# Patient Record
Sex: Male | Born: 1950
Health system: Southern US, Community
[De-identification: ages and names within clinical notes are randomized; demographics above are authoritative.]

## PROBLEM LIST (undated history)

## (undated) DIAGNOSIS — N189 Chronic kidney disease, unspecified: Secondary | ICD-10-CM

## (undated) DIAGNOSIS — E785 Hyperlipidemia, unspecified: Secondary | ICD-10-CM

## (undated) DIAGNOSIS — F1910 Other psychoactive substance abuse, uncomplicated: Secondary | ICD-10-CM

## (undated) DIAGNOSIS — R011 Cardiac murmur, unspecified: Secondary | ICD-10-CM

## (undated) DIAGNOSIS — I471 Supraventricular tachycardia: Secondary | ICD-10-CM

## (undated) DIAGNOSIS — I2510 Atherosclerotic heart disease of native coronary artery without angina pectoris: Secondary | ICD-10-CM

## (undated) DIAGNOSIS — K5792 Diverticulitis of intestine, part unspecified, without perforation or abscess without bleeding: Secondary | ICD-10-CM

## (undated) DIAGNOSIS — Z720 Tobacco use: Secondary | ICD-10-CM

## (undated) DIAGNOSIS — I10 Essential (primary) hypertension: Secondary | ICD-10-CM

## (undated) DIAGNOSIS — I350 Nonrheumatic aortic (valve) stenosis: Secondary | ICD-10-CM

## (undated) HISTORY — PX: CARDIAC CATHETERIZATION: SHX172

## (undated) HISTORY — DX: Chronic kidney disease, unspecified: N18.9

## (undated) HISTORY — DX: Atherosclerotic heart disease of native coronary artery without angina pectoris: I25.10

## (undated) HISTORY — DX: Essential (primary) hypertension: I10

## (undated) HISTORY — DX: Hyperlipidemia, unspecified: E78.5

## (undated) HISTORY — DX: Tobacco use: Z72.0

## (undated) HISTORY — DX: Diverticulitis of intestine, part unspecified, without perforation or abscess without bleeding: K57.92

## (undated) HISTORY — PX: CARDIAC ELECTROPHYSIOLOGY STUDY AND ABLATION: SHX1294

## (undated) HISTORY — DX: Other psychoactive substance abuse, uncomplicated: F19.10

## (undated) HISTORY — DX: Supraventricular tachycardia: I47.1

## (undated) HISTORY — DX: Nonrheumatic aortic (valve) stenosis: I35.0

## (undated) HISTORY — DX: Cardiac murmur, unspecified: R01.1

---

## 2012-01-01 ENCOUNTER — Ambulatory Visit: Payer: Self-pay | Admitting: Family Medicine

## 2012-04-24 DIAGNOSIS — I2510 Atherosclerotic heart disease of native coronary artery without angina pectoris: Secondary | ICD-10-CM

## 2012-04-24 DIAGNOSIS — I350 Nonrheumatic aortic (valve) stenosis: Secondary | ICD-10-CM

## 2012-04-24 HISTORY — DX: Atherosclerotic heart disease of native coronary artery without angina pectoris: I25.10

## 2012-04-24 HISTORY — DX: Nonrheumatic aortic (valve) stenosis: I35.0

## 2012-04-29 ENCOUNTER — Ambulatory Visit (INDEPENDENT_AMBULATORY_CARE_PROVIDER_SITE_OTHER): Payer: PRIVATE HEALTH INSURANCE | Admitting: Internal Medicine

## 2012-04-29 ENCOUNTER — Encounter: Payer: Self-pay | Admitting: Internal Medicine

## 2012-04-29 VITALS — BP 130/68 | HR 62 | Temp 98.2°F | Resp 18 | Wt 230.5 lb

## 2012-04-29 DIAGNOSIS — R5381 Other malaise: Secondary | ICD-10-CM

## 2012-04-29 DIAGNOSIS — R5383 Other fatigue: Secondary | ICD-10-CM

## 2012-04-29 DIAGNOSIS — M1990 Unspecified osteoarthritis, unspecified site: Secondary | ICD-10-CM

## 2012-04-29 DIAGNOSIS — R011 Cardiac murmur, unspecified: Secondary | ICD-10-CM

## 2012-04-29 DIAGNOSIS — R635 Abnormal weight gain: Secondary | ICD-10-CM

## 2012-04-29 DIAGNOSIS — M2550 Pain in unspecified joint: Secondary | ICD-10-CM

## 2012-04-29 DIAGNOSIS — Z79899 Other long term (current) drug therapy: Secondary | ICD-10-CM

## 2012-04-29 DIAGNOSIS — R079 Chest pain, unspecified: Secondary | ICD-10-CM

## 2012-04-29 DIAGNOSIS — M129 Arthropathy, unspecified: Secondary | ICD-10-CM

## 2012-04-29 NOTE — Patient Instructions (Signed)
Read about the Mediterranean  Diet.

## 2012-04-29 NOTE — Progress Notes (Signed)
Patient ID: Chase Barton, male   DOB: July 05, 1951, 61 y.o.   MRN: [id number]  Patient Active Problem List  Diagnoses  . Chest pain on exertion  . Arthritis  . Systolic murmur  . Joint pain    Subjective:  CC:   Chief Complaint  Patient presents with  . New Patient    HPI:   Chase Dipasqualeis a 61 y.o. male who presents To establish primary care .  Transferring fro [facility].  Last seen 3 months ago for back pain .   He is a current smoker x 40 yrs. His cc is hest pain accompanied by  episodes of light headednessand diaphoresis  which has occurred while driving, walking and at rest.  5 or 6 in the last 6 months .   No jaw or shoulder pain. Episodes last 3 to 7 minutes, resolve with rest and/or meditation.   Occasional dsypnea.  Mother had AMI in 66's ,father in his 36's.  Last noninvasive stress test was 15 yrs ago, done because of arrhythmia.   Has gained 20 lbs in the past year .  Has a murmur on right ,  Radiates to left carotid.,  Has been having arthritis, muscle pain for the last several months, has bilateral numbness in both hands which resolves with being awake.  Aggravated by physical labor.  Wakes up feeling tired and is told that he snores.  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       Past Medical History  Diagnosis Date  . Substance abuse     remote    History reviewed. No pertinent past surgical history.       The following portions of the patient's history were reviewed and updated as appropriate: Allergies, current medications, and problem list.    Review of  Systems:   12 Pt  review of systems was negative except those addressed in the HPI,     History   Social History  . Marital Status: Married    Spouse Name: N/A    Number of Children: N/A  . Years of Education: N/A   Occupational History  . Not on file.   Social History Main Topics  . Smoking status: Current Everyday Smoker -- 3.0 packs/day    Types: Cigars  . Smokeless tobacco: Current User  . Alcohol Use: Yes     rare  . Drug Use: No  . Sexually Active: Not on file   Other Topics Concern  . Not on file   Social History Narrative  . No narrative on file    Objective:  BP 130/68  Pulse 62  Temp(Src) 98.2 F (36.8 C) (Oral)  Resp 18  Wt 230 lb 8 oz (104.554 kg)  SpO2 96%  General appearance: alert, cooperative and appears stated age Ears: normal TM's and external ear canals both ears Throat: lips, mucosa, and tongue normal; teeth and gums normal Neck: no adenopathy, no carotid bruit, supple, symmetrical, trachea midline and thyroid not enlarged, symmetric, no tenderness/mass/nodules Back: symmetric, no curvature. ROM normal. No CVA tenderness. Lungs: clear to auscultation  bilaterally Heart: regular rate and rhythm, S1, S2 normal, no murmur, click, rub or gallop Abdomen: soft, non-tender; bowel sounds normal; no masses,  no organomegaly Pulses: 2+ and symmetric Skin: Skin color, texture, turgor normal. No rashes or lesions Lymph nodes: Cervical, supraclavicular, and axillary nodes normal.  Assessment and Plan:  Chest pain on exertion Concerning for angina given his cardiac risk factors of tobaacco abuse and FH.  Refer to Cardiology for evaluation with stress testing. EKG from Feb 2012 shows sinus bradycardia, otherwise normal.  Will scan in . Lipids from 2012 were borderline,  LDL 132 , HDL 49 trigs 158.  Systolic murmur Reported by prior PCP per notes, but etiology unknown Will need ECHO to evaluate cause.  Referral to cardiology.  Arthritis Prior workup  included a CCP whch was normal symptoms  managed with celebrex. Will do autoimmune check with nexst lab draw. .  Refill given.   Fatigue accconmpaneid by snoring and weight gain.  Repeat TSH and CBC,  send for sleep study after cardiology workup    Updated Medication List Outpatient Encounter Prescriptions as of 04/29/2012  Medication Sig Dispense Refill  . celecoxib (CELEBREX) 200 MG capsule Take 200 mg by mouth daily.      . sildenafil (VIAGRA) 100 MG tablet Take 100 mg by mouth daily as needed.

## 2012-05-01 DIAGNOSIS — M2550 Pain in unspecified joint: Secondary | ICD-10-CM | POA: Insufficient documentation

## 2012-05-01 DIAGNOSIS — R079 Chest pain, unspecified: Secondary | ICD-10-CM | POA: Insufficient documentation

## 2012-05-01 DIAGNOSIS — R5383 Other fatigue: Secondary | ICD-10-CM | POA: Insufficient documentation

## 2012-05-01 DIAGNOSIS — M1990 Unspecified osteoarthritis, unspecified site: Secondary | ICD-10-CM | POA: Insufficient documentation

## 2012-05-01 DIAGNOSIS — R011 Cardiac murmur, unspecified: Secondary | ICD-10-CM | POA: Insufficient documentation

## 2012-05-01 DIAGNOSIS — G8929 Other chronic pain: Secondary | ICD-10-CM | POA: Insufficient documentation

## 2012-05-01 NOTE — Assessment & Plan Note (Signed)
accconmpaneid by snoring and weight gain.  Repeat TSH and CBC,  send for sleep study after cardiology workup

## 2012-05-01 NOTE — Assessment & Plan Note (Addendum)
Concerning for angina given his cardiac risk factors of tobaacco abuse and FH.  Refer to Cardiology for evaluation with stress testing. EKG from Feb 2012 shows sinus bradycardia, otherwise normal.  Will scan in . Lipids from 2012 were borderline,  LDL 132 , HDL 49 trigs 158.

## 2012-05-01 NOTE — Assessment & Plan Note (Addendum)
Reported by prior PCP per notes, but etiology unknown Will need ECHO to evaluate cause.  Referral to cardiology.

## 2012-05-01 NOTE — Assessment & Plan Note (Addendum)
Prior workup included a CCP whch was normal symptoms  managed with celebrex. Will do autoimmune check with nexst lab draw. .  Refill given.

## 2012-05-02 ENCOUNTER — Ambulatory Visit (INDEPENDENT_AMBULATORY_CARE_PROVIDER_SITE_OTHER): Payer: PRIVATE HEALTH INSURANCE | Admitting: Cardiovascular Disease

## 2012-05-02 ENCOUNTER — Other Ambulatory Visit: Payer: Self-pay

## 2012-05-02 ENCOUNTER — Other Ambulatory Visit (INDEPENDENT_AMBULATORY_CARE_PROVIDER_SITE_OTHER): Payer: PRIVATE HEALTH INSURANCE

## 2012-05-02 ENCOUNTER — Encounter: Payer: Self-pay | Admitting: Cardiovascular Disease

## 2012-05-02 VITALS — BP 140/82 | HR 61 | Ht 74.0 in | Wt 226.8 lb

## 2012-05-02 DIAGNOSIS — R0602 Shortness of breath: Secondary | ICD-10-CM

## 2012-05-02 DIAGNOSIS — R079 Chest pain, unspecified: Secondary | ICD-10-CM

## 2012-05-02 DIAGNOSIS — Z01818 Encounter for other preprocedural examination: Secondary | ICD-10-CM

## 2012-05-02 DIAGNOSIS — R011 Cardiac murmur, unspecified: Secondary | ICD-10-CM

## 2012-05-02 DIAGNOSIS — Z5181 Encounter for therapeutic drug level monitoring: Secondary | ICD-10-CM

## 2012-05-02 MED ORDER — NITROGLYCERIN 0.4 MG SL SUBL: 0.4000 mg | SUBLINGUAL_TABLET | SUBLINGUAL | Status: DC | PRN

## 2012-05-02 NOTE — Assessment & Plan Note (Signed)
His symptoms are worrisome for angina. Thus, I proceeded with a treadmill stress test today which was significantly abnormal with 1.5 mm ST depression in the inferior and inferolateral leads associated with significant dizziness and frequent PVCs. I advised him to start taking aspirin 81 mg once daily. I also provided him with nitroglycerin sublingual as needed. He was instructed on the proper use of this. I will schedule him for cardiac catheterization tomorrow. Risks, benefits and alternatives were discussed with the patient. An echocardiogram will be obtained before the catheterization to evaluate his aortic stenosis.

## 2012-05-02 NOTE — Assessment & Plan Note (Signed)
The patient had a cardiac murmur suggestive of aortic stenosis which does not seem to be severe by physical exam. An echocardiogram will be requested and reviewed before his cardiac catheterization.

## 2012-05-02 NOTE — Procedures (Signed)
    Treadmill Stress test  Indication: Chest pain.  Baseline Data:  Resting EKG shows NSR with rate of 79 bpm, nonspecific T wave changes. Resting blood pressure of 140/82 mm Hg Stand bruce protocal was used.  Exercise Data:  Patient exercised for 7 min 11 sec,  Peak heart rate of 129 bpm.  This was 82 % of the maximum predicted heart rate. The test was terminated prematurely due to dizziness, significant ST depression as well as frequent PVCs. Peak Blood pressure recorded was 168/58 Maximal work level: 10.1 METs.  Heart rate at 3 minutes in recovery was 87 bpm. BP response: Normal HR response: Normal  EKG with Exercise: Sinus tachycardia with frequent PVCs and 1.5 mm of downsloping ST depression in the inferior leads as well as V5 and V6.  FINAL IMPRESSION: Abnormal exercise stress test with significant downsloping ST depression at submaximal workload with associated frequent PVCs and symptoms of dyspnea and dizziness. High risk Duke treadmill score.  Recommendation: Cardiac catheterization.

## 2012-05-02 NOTE — Progress Notes (Signed)
HPI  Chase Dipasqualeis a 61 y.o. male who was referred by Dr. Darrick Huntsman for evaluation of chest pain and a cardiac murmur. He is a current smoker x 40 yrs. His cc is hest pain accompanied by episodes of light headednessand diaphoresis which has occurred while driving, walking and at rest. These episodes have worsened recently to the point of him cutting down on his physical activities. No jaw or shoulder pain. Episodes last 3 to 7 minutes, resolve with rest and/or meditation. Occasional dsypnea. Mother had AMI in 8's ,father in his 54's. No recent cardiac evaluation. He has known about a cardiac murmur for many years. 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    No Known Allergies   Current Outpatient Prescriptions on File Prior to Visit  Medication Sig Dispense Refill  . celecoxib (CELEBREX) 200 MG capsule Take 200 mg by mouth daily.      . sildenafil (VIAGRA) 100 MG tablet Take 100 mg by mouth daily as needed.      . nitroGLYCERIN (NITROSTAT) 0.4 MG SL tablet Place 1 tablet (0.4 mg total) under the tongue every 5 (five) minutes as needed for chest pain.  25 tablet  1     Past Medical History  Diagnosis Date  . Substance abuse     remote  . Heart murmur   . Hyperlipidemia      History reviewed. No pertinent past surgical history.   Family History  Problem Relation Age of Onset  . Heart disease Mother     s/p 4 vessel CABG x 2  . Heart disease Father     s/p CABG  . Cancer Father     metastatic prostate CA  . Cancer Brother 72    Lung     History   Social History  . Marital Status: Married    Spouse  Name: N/A    Number of Children: N/A  . Years of Education: N/A   Occupational History  . Not on file.   Social History Main Topics  . Smoking status: Current Everyday Smoker -- 3.0 packs/day    Types: Cigars  . Smokeless tobacco: Current User  . Alcohol Use: Yes     rare  . Drug Use: No  . Sexually Active: Not on file   Other Topics Concern  . Not on file   Social History Narrative  . No narrative on file     ROS Constitutional: Negative for fever, chills, diaphoresis, activity change, appetite change.  HENT: Negative for hearing loss, nosebleeds, congestion, sore throat, facial swelling, drooling, trouble swallowing, neck pain, voice change, sinus pressure and tinnitus.  Eyes: Negative for photophobia, pain, discharge and visual disturbance.  Respiratory: Negative for apnea, cough and wheezing.  Cardiovascular: Negative for leg swelling.  Gastrointestinal: Negative for nausea, vomiting, abdominal pain, diarrhea, constipation, blood in stool and abdominal distention.  Genitourinary: Negative for dysuria, urgency, frequency, hematuria and decreased urine volume.  Musculoskeletal: Negative for myalgias, back pain, joint swelling, arthralgias and gait problem.  Skin: Negative for color change, pallor, rash and wound.  Neurological: Negative for  tremors, seizures, syncope, speech difficulty, weakness,  light-headedness, numbness and headaches.  Psychiatric/Behavioral: Negative for suicidal ideas, hallucinations, behavioral problems and agitation. The patient is not nervous/anxious.     PHYSICAL EXAM   BP 140/82  Pulse 61  Ht 6\' 2"  (1.88 m)  Wt 226 lb 12.8 oz (102.876 kg)  BMI 29.12 kg/m2 Constitutional: He is oriented to person, place, and time. He appears well-developed and well-nourished. No distress.  HENT: No nasal discharge.  Head: Normocephalic and atraumatic.  Eyes: Pupils are equal and round. Right eye exhibits no discharge. Left eye exhibits no discharge.    Neck: Normal range of motion. Neck supple. No JVD present. No thyromegaly present.  Cardiovascular: Normal rate, regular rhythm, normal heart sounds and. Exam reveals no gallop and no friction rub. There is a 2/6 systolic ejection murmur at the aortic area which is early peaking with preserved S2.  Pulmonary/Chest: Effort normal and breath sounds normal. No stridor. No respiratory distress. He has no wheezes. He has no rales. He exhibits no tenderness.  Abdominal: Soft. Bowel sounds are normal. He exhibits no distension. There is no tenderness. There is no rebound and no guarding.  Musculoskeletal: Normal range of motion. He exhibits no edema and no tenderness.  Neurological: He is alert and oriented to person, place, and time. Coordination normal.  Skin: Skin is warm and dry. No rash noted. He is not diaphoretic. No erythema. No pallor.  Psychiatric: He has a normal mood and affect. His behavior is normal. Judgment and thought content normal.       EKG: Normal sinus rhythm with nonspecific T wave changes.   ASSESSMENT AND PLAN

## 2012-05-02 NOTE — Patient Instructions (Signed)
Start on Nitro SL 0.4  [id number]

## 2012-05-02 NOTE — Patient Instructions (Addendum)
Your physician has requested that you have an echocardiogram. Echocardiography is a painless test that uses sound waves to create images of your heart. It provides your doctor with information about the size and shape of your heart and how well your heart's chambers and valves are working. This procedure takes approximately one hour. There are no restrictions for this procedure.  Your physician has requested that you have an exercise tolerance test. For further information please visit [email or web address] Please also follow instruction sheet, as given.  Please start on SL NTG 0.4 mg place under tongue. Start aspirin 81 mg take one tablet daily.

## 2012-05-03 ENCOUNTER — Ambulatory Visit: Payer: Self-pay | Admitting: Cardiovascular Disease

## 2012-05-03 DIAGNOSIS — I2510 Atherosclerotic heart disease of native coronary artery without angina pectoris: Secondary | ICD-10-CM

## 2012-05-03 LAB — CBC WITH DIFFERENTIAL/PLATELET
Basos: 0 % (ref 0–3)
Eos: 2 % (ref 0–7)
HCT: 49.6 % (ref 37.5–51.0)
Hemoglobin: 17.3 g/dL (ref 12.6–17.7)
Immature Granulocytes: 0 % (ref 0–2)
Lymphocytes Absolute: 1.9 10*3/uL (ref 0.7–4.5)
MCV: 88 fL (ref 79–97)
Monocytes Absolute: 0.5 10*3/uL (ref 0.1–1.0)
Monocytes: 6 % (ref 4–13)
Neutrophils Absolute: 5.7 10*3/uL (ref 1.8–7.8)
RBC: 5.65 x10E6/uL (ref 4.14–5.80)
RDW: 13.3 % (ref 12.3–15.4)
WBC: 8.2 10*3/uL (ref 4.0–10.5)

## 2012-05-03 LAB — BASIC METABOLIC PANEL
BUN/Creatinine Ratio: 20 (ref 10–22)
BUN: 22 mg/dL (ref 8–27)
CO2: 17 mmol/L — ABNORMAL LOW (ref 20–32)
Calcium: 9.8 mg/dL (ref 8.6–10.2)
Creatinine, Ser: 1.09 mg/dL (ref 0.76–1.27)
GFR calc non Af Amer: 72 mL/min/{1.73_m2} (ref >59)

## 2012-05-03 LAB — PROTIME-INR: Prothrombin Time: 11.2 s (ref 9.1–12.0)

## 2012-05-04 DIAGNOSIS — I200 Unstable angina: Secondary | ICD-10-CM

## 2012-05-04 DIAGNOSIS — R9430 Abnormal result of cardiovascular function study, unspecified: Secondary | ICD-10-CM

## 2012-05-04 LAB — BASIC METABOLIC PANEL
Anion Gap: 5 — ABNORMAL LOW (ref 7–16)
Calcium, Total: 8.2 mg/dL — ABNORMAL LOW (ref 8.5–10.1)
Chloride: 110 mmol/L — ABNORMAL HIGH (ref 98–107)
Co2: 29 mmol/L (ref 21–32)
Creatinine: 1.01 mg/dL (ref 0.60–1.30)
EGFR (Non-African Amer.): >60
Osmolality: 290 (ref 275–301)
Potassium: 4 mmol/L (ref 3.5–5.1)
Sodium: 144 mmol/L (ref 136–145)

## 2012-05-04 LAB — CK TOTAL AND CKMB (NOT AT ARMC)
CK, Total: 60 U/L (ref 35–232)
CK-MB: 1.2 ng/mL (ref 0.5–3.6)

## 2012-05-06 ENCOUNTER — Encounter: Payer: Self-pay | Admitting: Cardiovascular Disease

## 2012-05-06 ENCOUNTER — Ambulatory Visit (INDEPENDENT_AMBULATORY_CARE_PROVIDER_SITE_OTHER): Payer: PRIVATE HEALTH INSURANCE | Admitting: Cardiovascular Disease

## 2012-05-06 VITALS — BP 132/80 | HR 65 | Ht 74.0 in | Wt 226.0 lb

## 2012-05-06 DIAGNOSIS — I350 Nonrheumatic aortic (valve) stenosis: Secondary | ICD-10-CM

## 2012-05-06 DIAGNOSIS — Z720 Tobacco use: Secondary | ICD-10-CM

## 2012-05-06 DIAGNOSIS — I359 Nonrheumatic aortic valve disorder, unspecified: Secondary | ICD-10-CM

## 2012-05-06 DIAGNOSIS — E785 Hyperlipidemia, unspecified: Secondary | ICD-10-CM

## 2012-05-06 DIAGNOSIS — E782 Mixed hyperlipidemia: Secondary | ICD-10-CM | POA: Insufficient documentation

## 2012-05-06 DIAGNOSIS — F17200 Nicotine dependence, unspecified, uncomplicated: Secondary | ICD-10-CM

## 2012-05-06 DIAGNOSIS — R0989 Other specified symptoms and signs involving the circulatory and respiratory systems: Secondary | ICD-10-CM

## 2012-05-06 DIAGNOSIS — I2510 Atherosclerotic heart disease of native coronary artery without angina pectoris: Secondary | ICD-10-CM

## 2012-05-06 NOTE — Patient Instructions (Signed)
Your physician has requested that you have a carotid duplex. This test is an ultrasound of the carotid arteries in your neck. It looks at blood flow through these arteries that supply the brain with blood. Allow one hour for this exam. There are no restrictions or special instructions.  Start cardiac rehab.   Get your blood work done in 2 weeks (already ordered by Dr. Darrick Huntsman).   Follow up in 2 months.

## 2012-05-06 NOTE — Assessment & Plan Note (Signed)
Due to his CAD, he was started on atorvastatin 40 mg once daily. I recommend a target LDL of less than 70. He already has labs scheduled to be done later this month which were requested by Dr. Darrick Huntsman. These should be postponed for one week to see the effect of atorvastatin. They can be done in 2 weeks from now.

## 2012-05-06 NOTE — Assessment & Plan Note (Signed)
This is mild to moderate at this point and will need to be monitored. I recommend an echocardiogram in May of 2014.

## 2012-05-06 NOTE — Assessment & Plan Note (Signed)
The patient has bilateral carotid bruits versus a radiated murmur from aortic stenosis. Due to his known history of atherosclerosis as well as risk factors, I recommend carotid duplex ultrasound.

## 2012-05-06 NOTE — Assessment & Plan Note (Signed)
I discussed with him the importance of smoking cessation and making this a priority at this point.

## 2012-05-06 NOTE — Assessment & Plan Note (Signed)
The patient seems to be doing reasonably well after her recent angioplasty and drug-eluting stent placement to the proximal LAD. His dyspnea has improved significantly. He had few minor episodes of chest discomfort but has not had to use nitroglycerin. I recommend continuing dual antiplatelet therapy for at least one year. I discussed with the patient the importance of lifestyle changes in order to decrease the chance of future cardiovascular events. We discussed the importance of controlling risk factors, healthy diet as well as regular exercise. I advised him to start cardiac rehabilitation.

## 2012-05-06 NOTE — Progress Notes (Signed)
HPI  This is a 61 year old male who is here today for a followup visit. I saw him last week for exertional chest pain and a cardiac murmur. Echocardiogram showed normal LV systolic function with mild to moderate aortic stenosis. He underwent a treadmill stress test on the same day which was highly abnormal with significant ST depression and frequent PVCs. He underwent cardiac catheterization next day on Friday via the right radial artery which showed severe two-vessel coronary artery disease. His RCA was found to be occluded with left to right collaterals and appears to be chronic. There was a 90% proximal LAD stenosis and mild disease in the left circumflex. Ejection fraction was normal with moderate aortic stenosis with a peak to peak gradient of 23 mm mercury. He underwent a successful angioplasty and drug-eluting stent placement to the left anterior descending artery without complications. He was discharged home on Saturday. I brought him for an early followup visit given that he will be going out of town this week. Overall, he feels better but has not done much physical activities. He had few episodes of chest tightness which were brief. The patient definitely has been under some stress related to the findings on cardiac catheterization.  No Known Allergies   Current Outpatient Prescriptions on File Prior to Visit  Medication Sig Dispense Refill  . aspirin EC 81 MG tablet Take 1 tablet (81 mg total) by mouth daily.      Marland Kitchen atorvastatin (LIPITOR) 40 MG tablet Take 40 mg by mouth daily.      . nitroGLYCERIN (NITROSTAT) 0.4 MG SL tablet Place 1 tablet (0.4 mg total) under the tongue every 5 (five) minutes as needed for chest pain.  25 tablet  1  . sildenafil (VIAGRA) 100 MG tablet Take 100 mg by mouth daily as needed.         Past Medical History  Diagnosis Date  . Substance abuse     remote  . Heart murmur   . Aortic stenosis 04/2012    mild-moderate  . Coronary artery disease 04/2012      Cardiac cath 04/2012: LAD, 90% proximal, RCA: 100% mid with left to right collaterals, Normal EF, Moderate aortic stenosis (peak -peak gradient of 23 mm Hg), LAD PCI with a DES placment.   . Hyperlipidemia      Past Surgical History  Procedure Date  . Cardiac catheterization      Family History  Problem Relation Age of Onset  . Heart disease Mother     s/p 4 vessel CABG x 2  . Heart disease Father     s/p CABG  . Cancer Father     metastatic prostate CA  . Cancer Brother 83    Lung     History   Social History  . Marital Status: Married    Spouse Name: N/A    Number of Children: N/A  . Years of Education: N/A   Occupational History  . Not on file.   Social History Main Topics  . Smoking status: Current Everyday Smoker -- 3.0 packs/day    Types: Cigars  . Smokeless tobacco: Current User  . Alcohol Use: Yes     rare  . Drug Use: No  . Sexually Active: Not on file   Other Topics Concern  . Not on file   Social History Narrative  . No narrative on file     PHYSICAL EXAM   BP 132/80  Pulse 65  Ht 6\' 2"  (1.88  m)  Wt 226 lb (102.513 kg)  BMI 29.02 kg/m2 Constitutional: He is oriented to person, place, and time. He appears well-developed and well-nourished. No distress.  HENT: No nasal discharge.  Head: Normocephalic and atraumatic.  Eyes: Pupils are equal and round. Right eye exhibits no discharge. Left eye exhibits no discharge.  Neck: Normal range of motion. Neck supple. No JVD present. No thyromegaly present. Bilateral carotid bruits are here versus radiated murmur. Cardiovascular: Normal rate, regular rhythm, normal heart sounds and. Exam reveals no gallop and no friction rub. There is a 2/6 systolic ejection murmur at the aortic area which is early peaking with preserved S2.  Pulmonary/Chest: Effort normal and breath sounds normal. No stridor. No respiratory distress. He has no wheezes. He has no rales. He exhibits no tenderness.  Abdominal: Soft.  Bowel sounds are normal. He exhibits no distension. There is no tenderness. There is no rebound and no guarding.  Musculoskeletal: Normal range of motion. He exhibits no edema and no tenderness.  Neurological: He is alert and oriented to person, place, and time. Coordination normal.  Skin: Skin is warm and dry. No rash noted. He is not diaphoretic. No erythema. No pallor.  Psychiatric: He has a normal mood and affect. His behavior is normal. Judgment and thought content normal.  Right radial pulse is normal with no hematoma.     ASSESSMENT AND PLAN

## 2012-05-14 ENCOUNTER — Encounter: Payer: Self-pay | Admitting: Cardiovascular Disease

## 2012-05-16 ENCOUNTER — Encounter (INDEPENDENT_AMBULATORY_CARE_PROVIDER_SITE_OTHER): Payer: PRIVATE HEALTH INSURANCE

## 2012-05-16 DIAGNOSIS — R0989 Other specified symptoms and signs involving the circulatory and respiratory systems: Secondary | ICD-10-CM

## 2012-05-21 ENCOUNTER — Other Ambulatory Visit (INDEPENDENT_AMBULATORY_CARE_PROVIDER_SITE_OTHER): Payer: PRIVATE HEALTH INSURANCE | Admitting: *Deleted

## 2012-05-21 DIAGNOSIS — M1990 Unspecified osteoarthritis, unspecified site: Secondary | ICD-10-CM

## 2012-05-21 DIAGNOSIS — R635 Abnormal weight gain: Secondary | ICD-10-CM

## 2012-05-21 DIAGNOSIS — M129 Arthropathy, unspecified: Secondary | ICD-10-CM

## 2012-05-21 DIAGNOSIS — R079 Chest pain, unspecified: Secondary | ICD-10-CM

## 2012-05-21 DIAGNOSIS — R5381 Other malaise: Secondary | ICD-10-CM

## 2012-05-21 DIAGNOSIS — R5383 Other fatigue: Secondary | ICD-10-CM

## 2012-05-21 LAB — LIPID PANEL
HDL: 42.6 mg/dL (ref >39.00)
LDL Cholesterol: 64 mg/dL (ref 0–99)
Total CHOL/HDL Ratio: 3
Triglycerides: 119 mg/dL (ref 0.0–149.0)
VLDL: 23.8 mg/dL (ref 0.0–40.0)

## 2012-05-21 LAB — CBC WITH DIFFERENTIAL/PLATELET
Basophils Absolute: 0 10*3/uL (ref 0.0–0.1)
Eosinophils Absolute: 0.2 10*3/uL (ref 0.0–0.7)
Lymphocytes Relative: 14.1 % (ref 12.0–46.0)
MCHC: 33.8 g/dL (ref 30.0–36.0)
Neutro Abs: 5.3 10*3/uL (ref 1.4–7.7)
Neutrophils Relative %: 77.3 % — ABNORMAL HIGH (ref 43.0–77.0)
Platelets: 157 10*3/uL (ref 150.0–400.0)
RDW: 13.2 % (ref 11.5–14.6)

## 2012-05-21 LAB — SEDIMENTATION RATE: Sed Rate: 14 mm/hr (ref 0–22)

## 2012-05-22 ENCOUNTER — Other Ambulatory Visit: Payer: PRIVATE HEALTH INSURANCE

## 2012-05-22 LAB — RHEUMATOID FACTOR: Rhuematoid fact SerPl-aCnc: <10 IU/mL (ref <=14)

## 2012-05-22 LAB — COMPLETE METABOLIC PANEL WITH GFR
ALT: 21 U/L (ref 0–53)
AST: 20 U/L (ref 0–37)
Alkaline Phosphatase: 65 U/L (ref 39–117)
CO2: 26 mEq/L (ref 19–32)
Creat: 1.07 mg/dL (ref 0.50–1.35)
GFR, Est African American: 86 mL/min
Total Bilirubin: 0.6 mg/dL (ref 0.3–1.2)

## 2012-05-27 ENCOUNTER — Telehealth: Payer: Self-pay | Admitting: Cardiovascular Disease

## 2012-05-27 NOTE — Telephone Encounter (Signed)
Pt calling stating that he is experiencing weakness in muscle, tired, groggy and some CPfrom Blood thinner and cholesterol.

## 2012-05-27 NOTE — Telephone Encounter (Signed)
Pt reports worsening muscle aches, weakness since starting atorvastatin 3 weeks ago.  He has also noticed some chest pain in "upper part of chest" after he takes the medication.  He says this type of pain is completely different from the pain he was having pre stent, says the pain he was having pre stent was much different and worse.  He confirms compliance with Plavix.  I advised him to hold the atorvastatin for a few days and see if symptoms improve.  I will call him/he will call me Friday to report. If symptoms improve with holding med, we may need to change him to another lipid lowering agent.   In the meantime, he should contact us ASAP should his symptoms go unchanged/get worse.  Understanding verb.

## 2012-05-27 NOTE — Telephone Encounter (Signed)
Arida pt, but please see below what I did. thanks

## 2012-05-28 NOTE — Telephone Encounter (Signed)
Sounds good

## 2012-06-19 ENCOUNTER — Telehealth: Payer: Self-pay | Admitting: Cardiovascular Disease

## 2012-06-19 ENCOUNTER — Ambulatory Visit: Payer: PRIVATE HEALTH INSURANCE | Admitting: Cardiovascular Disease

## 2012-06-19 NOTE — Telephone Encounter (Signed)
Pt is s/p stent to LAD in May 2013.   He has been exercising (run/walk x2 miles daily and weight lifting).  He says he was exercising yesterday and became very dizzy, diaphoretic (more than usual), began having severe chest pressure.  He then decided to sit down and try to rest.  Chest pressure resumed with rest.  He took a NTG SL x1 and got relief.  He has yet to have to use the NTG until yesterday.  He also states he is aware this may be musculoskeletal discomfort, but states, "I know what sore muscles feel like and this was different.  This was internal".  He confirms compliance with Plavix and ASA. He was unable to tolerate the atorvastatin and is holding this (Dr. Kirke Corin aware).  Prior to stent to LAD, he had dyspnea and chest pressure.  He says the pressure he had yesterday was similar to pre stent discomfort.  He also says he had to "take deep breaths" during episode yesterday.  This am he is asymptomatic.  Based on his symptoms and recent history, I advised him to come in for OV to be assessed.  Understanding verb. Coming in today to see Dr. Mariah Milling in Dr. Jari Sportsman absence at 2 pm.

## 2012-06-19 NOTE — Telephone Encounter (Signed)
Pt calling stating that he had to take a nitro last night around 8 pm due to chest tightness, sweating, pressure and pain, slight lightheadness. No vomiting. Pain went away after taking pill. Pain did not return.

## 2012-06-19 NOTE — Telephone Encounter (Signed)
Discussed situation with Dr. Mariah Milling. He says he would be glad to see pt today if pt needs to.  Otherwise pt may want to wait until he can see Dr. Kirke Corin tomm in office. If stent restenosis/etc Dr. Kirke Corin would need to address.  I called pt to give him the option of either come in today at 3:30 pm with Dr. Mariah Milling or await appt tomm to see Dr. Kirke Corin.  He chose to wait until tomm to see Dr. Kirke Corin at 11:15.  He will take NTG SL PRN CP in the interim.

## 2012-06-20 ENCOUNTER — Encounter: Payer: Self-pay | Admitting: Cardiovascular Disease

## 2012-06-20 ENCOUNTER — Ambulatory Visit (INDEPENDENT_AMBULATORY_CARE_PROVIDER_SITE_OTHER): Payer: PRIVATE HEALTH INSURANCE | Admitting: Cardiovascular Disease

## 2012-06-20 VITALS — BP 160/70 | HR 60 | Ht 74.0 in | Wt 228.0 lb

## 2012-06-20 DIAGNOSIS — I2510 Atherosclerotic heart disease of native coronary artery without angina pectoris: Secondary | ICD-10-CM

## 2012-06-20 DIAGNOSIS — I10 Essential (primary) hypertension: Secondary | ICD-10-CM

## 2012-06-20 DIAGNOSIS — F17200 Nicotine dependence, unspecified, uncomplicated: Secondary | ICD-10-CM

## 2012-06-20 DIAGNOSIS — E785 Hyperlipidemia, unspecified: Secondary | ICD-10-CM

## 2012-06-20 DIAGNOSIS — Z720 Tobacco use: Secondary | ICD-10-CM

## 2012-06-20 DIAGNOSIS — R079 Chest pain, unspecified: Secondary | ICD-10-CM

## 2012-06-20 HISTORY — DX: Essential (primary) hypertension: I10

## 2012-06-20 MED ORDER — ISOSORBIDE MONONITRATE ER 30 MG PO TB24: 30.0000 mg | ORAL_TABLET | Freq: Every day | ORAL | Status: DC

## 2012-06-20 MED ORDER — AMLODIPINE BESYLATE 10 MG PO TABS: 10.0000 mg | ORAL_TABLET | Freq: Every day | ORAL | Status: DC

## 2012-06-20 NOTE — Patient Instructions (Addendum)
You are doing well. Please start taking crestor 5 mg every other day We should check your cholesterol in 3 months  Try amlodipine 1/2 once a day Monitor blood pressure If it continue to run high, take the full pilll  If you start to have more chest pain, call the office Also you could start the isosorbide one a day for pain  Increase aspirin to 81 mg x 2 Continue plavix  Stop cigar smoking  We will check other markers on next cholesterol check  Please call us if you have new issues that need to be addressed before your next appt.  Your physician wants you to follow-up in: 1 months.

## 2012-06-20 NOTE — Assessment & Plan Note (Signed)
If he continues to have episodes of chest pain, we have suggested he take nitroglycerin. We did prescribe isosorbide if he has additional episodes and have instructed him to call our office.

## 2012-06-20 NOTE — Assessment & Plan Note (Signed)
Blood pressure is very elevated today. We will start amlodipine 5 mg daily, titrating up to 10 mg daily if needed for systolic pressure greater than 140

## 2012-06-20 NOTE — Assessment & Plan Note (Signed)
We have asked him to cut back on his cigar smoking

## 2012-06-20 NOTE — Progress Notes (Signed)
Patient ID: Chase Barton, male    DOB: 26-Oct-1951, 61 y.o.   MRN: [id number]  HPI Comments: This is a 61 year old male with history of coronary artery disease, occluded RCA with collaterals from left to right, recent stent placement to his LAD, history of smoking cigars, strong family history of coronary artery disease, hypertension, who presents for chest pain symptoms.  Recent Echocardiogram showed normal LV systolic function with mild to moderate aortic stenosis.  He underwent a treadmill stress test on the same day which was highly abnormal with significant ST depression and frequent PVCs.  He underwent cardiac catheterization next day on Friday via the right radial artery which showed severe two-vessel coronary artery disease. His RCA was found to be occluded with left to right collaterals and appears to be chronic. There was a 90% proximal LAD stenosis and mild disease in the left circumflex. Ejection fraction was normal with moderate aortic stenosis with a peak to peak gradient of 23 mm mercury.   He underwent a successful angioplasty and drug-eluting stent placement to the left anterior descending artery without complications.  He is exercising since the stent on a regular basis but did have chest pain 2 days ago. He took nitroglycerin with complete resolution of his symptoms. He is concerned about recent chest pain and whether he should continue exercise.  EKG shows normal sinus rhythm with no significant ST or T wave changes. No change from prior EKG     Outpatient Encounter Prescriptions as of 06/20/2012  Medication Sig Dispense Refill  . aspirin EC 81 MG tablet Take 1 tablet (81 mg total) by mouth daily.      . clopidogrel (PLAVIX) 75 MG tablet Take 75 mg by mouth daily.      . Multiple Vitamin (MULTIVITAMIN) tablet Take 1 tablet by mouth daily.      . nitroGLYCERIN (NITROSTAT) 0.4 MG SL tablet Place 1 tablet (0.4 mg total) under the tongue every 5 (five) minutes as needed for  chest pain.  25 tablet  1  . sildenafil (VIAGRA) 100 MG tablet Take 100 mg by mouth daily as needed.      . Vitamin E LIQD by Does not apply route.        Review of Systems  Constitutional: Negative.   HENT: Negative.   Eyes: Negative.   Respiratory: Negative.   Cardiovascular: Positive for chest pain.  Gastrointestinal: Negative.   Musculoskeletal: Negative.   Skin: Negative.   Neurological: Negative.   Hematological: Negative.   Psychiatric/Behavioral: Negative.   All other systems reviewed and are negative.    BP 160/70  Pulse 60  Ht 6\' 2"  (1.88 m)  Wt 228 lb (103.42 kg)  BMI 29.27 kg/m2  Physical Exam  Nursing note and vitals reviewed. Constitutional: He is oriented to person, place, and time. He appears well-developed and well-nourished.  HENT:  Head: Normocephalic.  Nose: Nose normal.  Mouth/Throat: Oropharynx is clear and moist.  Eyes: Conjunctivae are normal. Pupils are equal, round, and reactive to light.  Neck: Normal range of motion. Neck supple. No JVD present.  Cardiovascular: Normal rate, regular rhythm, S1 normal, S2 normal, normal heart sounds and intact distal pulses.  Exam reveals no gallop and no friction rub.   No murmur heard. Pulmonary/Chest: Effort normal and breath sounds normal. No respiratory distress. He has no wheezes. He has no rales. He exhibits no tenderness.  Abdominal: Soft. Bowel sounds are normal. He exhibits no distension. There is no tenderness.  Musculoskeletal: Normal range  of motion. He exhibits no edema and no tenderness.  Lymphadenopathy:    He has no cervical adenopathy.  Neurological: He is alert and oriented to person, place, and time. Coordination normal.  Skin: Skin is warm and dry. No rash noted. No erythema.  Psychiatric: He has a normal mood and affect. His behavior is normal. Judgment and thought content normal.           Assessment and Plan

## 2012-06-20 NOTE — Assessment & Plan Note (Signed)
He is currently not on a statin and we will start Crestor 5 mg every other day, titrating up to daily as tolerated.

## 2012-06-20 NOTE — Assessment & Plan Note (Signed)
No significant EKG changes. No further symptoms apart from 2 days ago. We have asked him to resume his activity and contact our office if he has any further symptoms.

## 2012-07-09 ENCOUNTER — Encounter: Payer: Self-pay | Admitting: Cardiovascular Disease

## 2012-07-09 ENCOUNTER — Ambulatory Visit (INDEPENDENT_AMBULATORY_CARE_PROVIDER_SITE_OTHER): Payer: BC Managed Care – PPO | Admitting: Cardiovascular Disease

## 2012-07-09 VITALS — BP 128/70 | HR 60 | Ht 74.0 in | Wt 230.5 lb

## 2012-07-09 DIAGNOSIS — R0989 Other specified symptoms and signs involving the circulatory and respiratory systems: Secondary | ICD-10-CM

## 2012-07-09 DIAGNOSIS — I350 Nonrheumatic aortic (valve) stenosis: Secondary | ICD-10-CM

## 2012-07-09 DIAGNOSIS — I2510 Atherosclerotic heart disease of native coronary artery without angina pectoris: Secondary | ICD-10-CM

## 2012-07-09 DIAGNOSIS — I359 Nonrheumatic aortic valve disorder, unspecified: Secondary | ICD-10-CM

## 2012-07-09 DIAGNOSIS — Z720 Tobacco use: Secondary | ICD-10-CM

## 2012-07-09 DIAGNOSIS — F17200 Nicotine dependence, unspecified, uncomplicated: Secondary | ICD-10-CM

## 2012-07-09 DIAGNOSIS — E785 Hyperlipidemia, unspecified: Secondary | ICD-10-CM

## 2012-07-09 MED ORDER — ROSUVASTATIN CALCIUM 5 MG PO TABS: 5.0000 mg | ORAL_TABLET | Freq: Every day | ORAL | Status: DC

## 2012-07-09 NOTE — Assessment & Plan Note (Signed)
He is trying to quit with electronic cigarettes.

## 2012-07-09 NOTE — Assessment & Plan Note (Signed)
He is currently doing well. No further chest pain. He can continue exercise with a peak target heart rate of 140 beats per minute. Continue amlodipine 5 mg daily. The patient does use Viagra as needed and thus I instructed him not to start long acting nitroglycerin. Continue dual antiplatelet therapy.

## 2012-07-09 NOTE — Patient Instructions (Addendum)
Do not start taking Isosorbide mononitrate.  Take Crestor 5 mg daily.  Labs in 4 weeks (fasting).  You can get your heart rate up to 140 bpm.  Follow up in 6 months.

## 2012-07-09 NOTE — Assessment & Plan Note (Signed)
He is tolerating Crestor every other day. I've asked him to start taking it every day. I will request fasting lipid and liver profile in 4 weeks.

## 2012-07-09 NOTE — Assessment & Plan Note (Signed)
No significant carotid disease by carotid duplex ultrasound.

## 2012-07-09 NOTE — Progress Notes (Signed)
HPI  This is a 61 year old male with history of moderate aortic stenosis, coronary artery disease, occluded RCA with collaterals from left to right,  stent placement to his LAD in May, history of smoking cigars, strong family history of coronary artery disease, hypertension, who presents for a followup visit.  Recent Echocardiogram showed normal LV systolic function with mild to moderate aortic stenosis.  He underwent a treadmill stress test on the same day which was highly abnormal with significant ST depression and frequent PVCs.  He underwent cardiac catheterization in May via the right radial artery which showed severe two-vessel coronary artery disease. His RCA was found to be occluded with left to right collaterals and appears to be chronic. There was a 90% proximal LAD stenosis and mild disease in the left circumflex. Ejection fraction was normal with moderate aortic stenosis with a peak to peak gradient of 23 mm mercury.  He underwent a successful angioplasty and drug-eluting stent placement to the left anterior descending artery without complications.  He was seen by Dr. Mariah Milling recently for chest pain. The patient was exercising extensively at that time and was getting his heart rate up to 160 beats per minute. He was noted to be hypertensive. He was started on amlodipine. He did not tolerate atorvastatin and thus he was started on Crestor 5 mg every other day. The patient cut down on his exercise and now is targeting the peak heart rate of 120 beats per minute. He had no further episodes of chest pain since then.   No Known Allergies   Current Outpatient Prescriptions on File Prior to Visit  Medication Sig Dispense Refill  . aspirin EC 81 MG tablet Take 1 tablet (81 mg total) by mouth daily.      . clopidogrel (PLAVIX) 75 MG tablet Take 75 mg by mouth daily.      . Multiple Vitamin (MULTIVITAMIN) tablet Take 1 tablet by mouth daily.      . nitroGLYCERIN (NITROSTAT) 0.4 MG SL tablet  Place 1 tablet (0.4 mg total) under the tongue every 5 (five) minutes as needed for chest pain.  25 tablet  1  . rosuvastatin (CRESTOR) 5 MG tablet Take 1 tablet (5 mg total) by mouth daily.  30 tablet  11  . sildenafil (VIAGRA) 100 MG tablet Take 100 mg by mouth daily as needed.      . Vitamin E LIQD by Does not apply route.      Marland Kitchen DISCONTD: amLODipine (NORVASC) 10 MG tablet Take 1 tablet (10 mg total) by mouth daily.  180 tablet  3     Past Medical History  Diagnosis Date  . Substance abuse     remote  . Heart murmur   . Aortic stenosis 04/2012    mild-moderate  . Coronary artery disease 04/2012    Cardiac cath 04/2012: LAD, 90% proximal, RCA: 100% mid with left to right collaterals, Normal EF, Moderate aortic stenosis (peak -peak gradient of 23 mm Hg), LAD PCI with a DES placment.   . Hyperlipidemia   . Tobacco abuse   . Hypertension 06/20/2012     Past Surgical History  Procedure Date  . Cardiac catheterization      Family History  Problem Relation Age of Onset  . Heart disease Mother     s/p 4 vessel CABG x 2  . Heart disease Father     s/p CABG  . Cancer Father     metastatic prostate CA  . Cancer Brother  52    Lung     History   Social History  . Marital Status: Married    Spouse Name: N/A    Number of Children: N/A  . Years of Education: N/A   Occupational History  . Not on file.   Social History Main Topics  . Smoking status: Former Smoker -- 3.0 packs/day  . Smokeless tobacco: Current User   Comment: uses [employer]  . Alcohol Use: 0.6 oz/week    1 Glasses of wine per week     rare  . Drug Use: No  . Sexually Active: Not on file   Other Topics Concern  . Not on file   Social History Narrative  . No narrative on file     PHYSICAL EXAM   BP 128/70  Pulse 60  Ht 6\' 2"  (1.88 m)  Wt 230 lb 8 oz (104.554 kg)  BMI 29.59 kg/m2  Constitutional: He is oriented to person, place, and time. He appears well-developed and well-nourished. No  distress.  HENT: No nasal discharge.  Head: Normocephalic and atraumatic.  Eyes: Pupils are equal and round. Right eye exhibits no discharge. Left eye exhibits no discharge.  Neck: Normal range of motion. Neck supple. No JVD present. No thyromegaly present.  Cardiovascular: Normal rate, regular rhythm, normal heart sounds and. Exam reveals no gallop and no friction rub. There is a 2/6 systolic ejection murmur at the aortic area which is mid peaking.  Pulmonary/Chest: Effort normal and breath sounds normal. No stridor. No respiratory distress. He has no wheezes. He has no rales. He exhibits no tenderness.  Abdominal: Soft. Bowel sounds are normal. He exhibits no distension. There is no tenderness. There is no rebound and no guarding.  Musculoskeletal: Normal range of motion. He exhibits no edema and no tenderness.  Neurological: He is alert and oriented to person, place, and time. Coordination normal.  Skin: Skin is warm and dry. No rash noted. He is not diaphoretic. No erythema. No pallor.  Psychiatric: He has a normal mood and affect. His behavior is normal. Judgment and thought content normal.        ASSESSMENT AND PLAN

## 2012-07-09 NOTE — Assessment & Plan Note (Signed)
This was moderate. A followup echocardiogram is recommended in one year from now.

## 2012-08-06 ENCOUNTER — Ambulatory Visit (INDEPENDENT_AMBULATORY_CARE_PROVIDER_SITE_OTHER): Payer: BC Managed Care – PPO | Admitting: Internal Medicine

## 2012-08-06 ENCOUNTER — Telehealth: Payer: Self-pay | Admitting: Internal Medicine

## 2012-08-06 ENCOUNTER — Encounter: Payer: Self-pay | Admitting: Internal Medicine

## 2012-08-06 VITALS — BP 140/70 | HR 70 | Temp 97.7°F | Resp 18 | Wt 230.8 lb

## 2012-08-06 DIAGNOSIS — I739 Peripheral vascular disease, unspecified: Secondary | ICD-10-CM

## 2012-08-06 DIAGNOSIS — I359 Nonrheumatic aortic valve disorder, unspecified: Secondary | ICD-10-CM

## 2012-08-06 DIAGNOSIS — R5383 Other fatigue: Secondary | ICD-10-CM

## 2012-08-06 DIAGNOSIS — R011 Cardiac murmur, unspecified: Secondary | ICD-10-CM

## 2012-08-06 DIAGNOSIS — R5381 Other malaise: Secondary | ICD-10-CM

## 2012-08-06 DIAGNOSIS — J449 Chronic obstructive pulmonary disease, unspecified: Secondary | ICD-10-CM

## 2012-08-06 DIAGNOSIS — I350 Nonrheumatic aortic (valve) stenosis: Secondary | ICD-10-CM

## 2012-08-06 DIAGNOSIS — Z720 Tobacco use: Secondary | ICD-10-CM

## 2012-08-06 DIAGNOSIS — E785 Hyperlipidemia, unspecified: Secondary | ICD-10-CM

## 2012-08-06 DIAGNOSIS — F17200 Nicotine dependence, unspecified, uncomplicated: Secondary | ICD-10-CM

## 2012-08-06 MED ORDER — NICOTINE 14 MG/24HR TD PT24: 1.0000 | MEDICATED_PATCH | TRANSDERMAL | Status: AC

## 2012-08-06 MED ORDER — ALBUTEROL SULFATE HFA 108 (90 BASE) MCG/ACT IN AERS: 2.0000 | INHALATION_SPRAY | Freq: Four times a day (QID) | RESPIRATORY_TRACT | Status: DC | PRN

## 2012-08-06 MED ORDER — AMOXICILLIN-POT CLAVULANATE 875-125 MG PO TABS: 1.0000 | ORAL_TABLET | Freq: Two times a day (BID) | ORAL | Status: AC

## 2012-08-06 NOTE — Assessment & Plan Note (Addendum)
I was very blunt with him about the limitations of modern medicine in preventing recurrent stenoses of his arteries if he continues to smoke. I recommended a trial of transdermal nicotine and e cigs.

## 2012-08-06 NOTE — Assessment & Plan Note (Signed)
I am sending [facility] to [facility] for an arterial evaluation. Although he has a palpable dorsalis pedis pulse on the left his calf pain and left thigh pain is concerning for external or internal iliac artery stenoses with collateralization

## 2012-08-06 NOTE — Assessment & Plan Note (Addendum)
He is attributing his malaise to "all the medications" that he is on. He is not on a beta blocker or an ACE inhibitor only on amlodipine Plavix aspirin and Crestor. I suggested that his symptoms may be due to underlying depression regarding his own mortality versus persistent decompensation which was greater than he had previously assessed due to his chronic atherosclerosis.  We discussed starting an SSRI in the next several months if his symptoms do not improve.

## 2012-08-06 NOTE — Progress Notes (Signed)
Patient ID: Chase Barton, male   DOB: 07/22/1951, 61 y.o.   MRN: [id number]  Patient Active Problem List  Diagnosis  . Chest pain on exertion  . Arthritis  . Systolic murmur  . Fatigue  . Aortic stenosis  . Coronary artery disease  . Hyperlipidemia  . Carotid bruit  . Tobacco abuse  . Hypertension  . Claudication of left lower extremity  . Bronchitis with chronic airway obstruction    Subjective:  CC:   Chief Complaint  Patient presents with  . Nasal Congestion    HPI:   Chase Dipasqualeis a 61 y.o. male who presents Followup on acute and chronic conditions. Since our first and only prior visit, he was found and treated by PTCA/stent  For a 90% occluded LAD and completely occluded RCA with collateralization. There was some stenosis noted in the mid circumflex which was not enough to warrant an intervention. He did undergo placement of a drug-eluting stent. EF was reportedly normal. Moderate aortic stenosis was also noted. He has continued to smoke cigars regularly. He has returned to exercise and is running 2 miles a day and working out with [employer]. He states that he feels lousy. He he states that he is disappointed in his cardiologist because he expected to feel like a young man again  and does not understand why the other artery was not stented..  I explained to him the results of his catheterization and that there was no indication for stenting of the other artery. I also spoke with him at length about his performance status over the last several years.  he admits that he had not felt well for over a year and had  already given up daily exercise because of his progressive malaise and dyspnea .   He currently is having  bilateral leg pain  which he has attributed to shin splints but which occurs actually with ambulation and resolves with rest. This occurs mostly in the left leg but the right leg is also affected. He also has developed numbness of the left lateral fine which is  attributed to degenerative joint disease of the hip. This numbness is brought on by lying supine.  His acute issues are Nasal and chest congestion for the past 10 days .  He deniesfevers,  But has been having aproductive cough  with chest tightness  and occasional wheezing reported.      Past Medical History  Diagnosis Date  . Substance abuse     remote  . Heart murmur   . Aortic stenosis 04/2012    mild-moderate  . Coronary artery disease 04/2012    Cardiac cath 04/2012: LAD, 90% proximal, RCA: 100% mid with left to right collaterals, Normal EF, Moderate aortic stenosis (peak -peak gradient of 23 mm Hg), LAD PCI with a DES placment.   . Hyperlipidemia   . Tobacco abuse   . Hypertension 06/20/2012    Past Surgical History  Procedure Date  . Cardiac catheterization          The following portions of the patient's history were reviewed and updated as appropriate: Allergies, current medications, and problem list.    Review of Systems:   A comprehensive ROS was done and positive for claudication , cough and malaise.   The rest was negative.12 Pt  review of systems was negative except those addressed in the HPI,     History   Social History  . Marital Status: Married    Spouse Name: N/A  Number of Children: N/A  . Years of Education: N/A   Occupational History  . Not on file.   Social History Main Topics  . Smoking status: Former Smoker -- 3.0 packs/day  . Smokeless tobacco: Current User   Comment: uses [employer]  . Alcohol Use: 0.6 oz/week    1 Glasses of wine per week     rare  . Drug Use: No  . Sexually Active: Not on file   Other Topics Concern  . Not on file   Social History Narrative  . No narrative on file    Objective:  BP 140/70  Pulse 70  Temp 97.7 F (36.5 C) (Oral)  Resp 18  Wt 230 lb 12 oz (104.668 kg)  SpO2 97%  General appearance: alert, cooperative and appears stated age Ears: normal TM's and external ear canals both ears Throat:  lips, mucosa, and tongue normal; teeth and gums normal Neck: no adenopathy, no carotid bruit, supple, symmetrical, trachea midline and thyroid not enlarged, symmetric, no tenderness/mass/nodules Back: symmetric, no curvature. ROM normal. No CVA tenderness. Lungs: clear to auscultation bilaterally Heart: regular rate and rhythm, S1, S2 normal, no murmur, click, rub or gallop Abdomen: soft, non-tender; bowel sounds normal; no masses,  no organomegaly Pulses: 2+ and symmetric Skin: Skin color, texture, turgor normal. No rashes or lesions Lymph nodes: Cervical, supraclavicular, and axillary nodes normal.  Assessment and Plan:  Aortic stenosis Moderate by last echocardiogram. Diagnosis was discussed with patient he has had 2 conflicting prognoses in terms of how quickly this was advanced to the point of needing valvular replacement. He is asking for a definitive answer. I explained to him that this cannot be answered without a second point in time to measure. He understands he will need to have a repeat echocardiogram in one year she also understands that controlling his blood pressure and his cholesterol i.e. quitting smoking will have an effect on his progression.   Tobacco abuse I was very blunt with him about the limitations of modern medicine in preventing recurrent stenoses of his arteries if he continues to smoke. I recommended a trial of transdermal nicotine and e cigs.   Fatigue He is attributing his malaise to "all the medications" that he is on. He is not on a beta blocker or an ACE inhibitor only on amlodipine Plavix aspirin and Crestor. I suggested that his symptoms may be due to underlying depression regarding his own mortality versus persistent decompensation which was greater than he had previously assessed due to his chronic atherosclerosis.  We discussed starting an SSRI in the next several months if his symptoms do not improve.  Systolic murmur Secondary to aortic  stenosis.  Hyperlipidemia He is now tolerating Crestor daily without muscle aches. Last LDL was under 70. Repeat is due in several weeks.  Claudication of left lower extremity I am sending [facility] to [facility] for an arterial evaluation. Although he has a palpable dorsalis pedis pulse on the left his calf pain and left thigh pain is concerning for external or internal iliac artery stenoses with collateralization  Bronchitis with chronic airway obstruction His wheezing is very mild. He has had symptoms for the past 10 days. Upper airway also noted to have involvement with bilateral tympanic membrane effusions. Will treat with Augmentin and albuterol.  40 minutes was spent with this patient today reviewing his history and his current symptoms.  Updated Medication List Outpatient Encounter Prescriptions as of 08/06/2012  Medication Sig Dispense Refill  .  amLODipine (NORVASC) 10 MG tablet Take 5 mg by mouth daily.      Marland Kitchen aspirin EC 81 MG tablet Take 1 tablet (81 mg total) by mouth daily.      . clopidogrel (PLAVIX) 75 MG tablet Take 75 mg by mouth daily.      . Multiple Vitamin (MULTIVITAMIN) tablet Take 1 tablet by mouth daily.      . nitroGLYCERIN (NITROSTAT) 0.4 MG SL tablet Place 1 tablet (0.4 mg total) under the tongue every 5 (five) minutes as needed for chest pain.  25 tablet  1  . rosuvastatin (CRESTOR) 5 MG tablet Take 1 tablet (5 mg total) by mouth daily.  30 tablet  11  . sildenafil (VIAGRA) 100 MG tablet Take 100 mg by mouth daily as needed.      . Vitamin E LIQD by Does not apply route.      Marland Kitchen albuterol (PROVENTIL HFA;VENTOLIN HFA) 108 (90 BASE) MCG/ACT inhaler Inhale 2 puffs into the lungs every 6 (six) hours as needed for wheezing.  1 Inhaler  0  . amoxicillin-clavulanate (AUGMENTIN) 875-125 MG per tablet Take 1 tablet by mouth 2 (two) times daily.  14 tablet  0  . nicotine (NICODERM CQ) 14 mg/24hr patch Place 1 patch onto the skin daily.  28 patch  2     Orders  Placed This Encounter  Procedures  . Ambulatory referral to Vascular Surgery    No Follow-up on file.

## 2012-08-06 NOTE — Assessment & Plan Note (Signed)
He is now tolerating Crestor daily without muscle aches. Last LDL was under 70. Repeat is due in several weeks.

## 2012-08-06 NOTE — Assessment & Plan Note (Addendum)
Moderate by last echocardiogram. Diagnosis was discussed with patient he has had 2 conflicting prognoses in terms of how quickly this was advanced to the point of needing valvular replacement. He is asking for a definitive answer. I explained to him that this cannot be answered without a second point in time to measure. He understands he will need to have a repeat echocardiogram in one year she also understands that controlling his blood pressure and his cholesterol i.e. quitting smoking will have an effect on his progression.

## 2012-08-06 NOTE — Assessment & Plan Note (Signed)
Secondary to aortic stenosis.

## 2012-08-06 NOTE — Telephone Encounter (Signed)
Could you also fax office notes and meds.  My fax machine is down.

## 2012-08-06 NOTE — Patient Instructions (Addendum)
You have a sinus/ear infection   .  I am prescribing an antibiotic (augmentin) and and albuterol. .   I also advise use of the following OTC meds to help with your other symptoms.   Take generic OTC benadryl 25 mg every 8 hours for the drainage,  Sudafed PE  10 to 30 mg every 8 hours for the congestion, you may substitute Afrin nasal spray for the nighttime dose for a few days.  flushes your sinuses twice daily with Simply Saline (do over the sink because if you do it right you will spit out globs of mucus)  Use OTC  Delsym   FOR THE COUGH.  Gargle with salt water as needed for sore throat.      I am referring you  to [facility] to check out the circulation in your legs.

## 2012-08-07 ENCOUNTER — Encounter: Payer: Self-pay | Admitting: Internal Medicine

## 2012-08-07 DIAGNOSIS — J449 Chronic obstructive pulmonary disease, unspecified: Secondary | ICD-10-CM | POA: Insufficient documentation

## 2012-08-07 NOTE — Assessment & Plan Note (Signed)
His wheezing is very mild. He has had symptoms for the past 10 days. Upper airway also noted to have involvement with bilateral tympanic membrane effusions. Will treat with Augmentin and albuterol.

## 2012-08-07 NOTE — Telephone Encounter (Signed)
Erie Noe faxed this over on 08/06/12

## 2012-08-08 ENCOUNTER — Telehealth: Payer: Self-pay | Admitting: Internal Medicine

## 2012-08-08 NOTE — Telephone Encounter (Signed)
His lower extremity arterial evaluation was normal. No signs of circulation problems in his leg.

## 2012-08-08 NOTE — Telephone Encounter (Signed)
Patient notified

## 2012-08-12 ENCOUNTER — Other Ambulatory Visit (INDEPENDENT_AMBULATORY_CARE_PROVIDER_SITE_OTHER): Payer: BC Managed Care – PPO

## 2012-08-12 DIAGNOSIS — E785 Hyperlipidemia, unspecified: Secondary | ICD-10-CM

## 2012-08-13 LAB — LIPID PANEL
Chol/HDL Ratio: 3.8 ratio units (ref 0.0–5.0)
HDL: 41 mg/dL (ref >39)
LDL Calculated: 85 mg/dL (ref 0–99)
VLDL Cholesterol Cal: 28 mg/dL (ref 5–40)

## 2012-08-13 LAB — HEPATIC FUNCTION PANEL
Albumin: 4.7 g/dL (ref 3.6–4.8)
Total Protein: 6.7 g/dL (ref 6.0–8.5)

## 2012-08-15 ENCOUNTER — Other Ambulatory Visit: Payer: Self-pay

## 2012-08-15 ENCOUNTER — Encounter: Payer: Self-pay | Admitting: Internal Medicine

## 2012-08-15 MED ORDER — ROSUVASTATIN CALCIUM 10 MG PO TABS: 10.0000 mg | ORAL_TABLET | Freq: Every day | ORAL | Status: DC

## 2012-08-25 ENCOUNTER — Other Ambulatory Visit: Payer: Self-pay | Admitting: Cardiovascular Disease

## 2012-10-07 ENCOUNTER — Telehealth: Payer: Self-pay | Admitting: Cardiovascular Disease

## 2012-10-07 NOTE — Telephone Encounter (Signed)
Plavix cannot be safely interrupted for one year after the stent. That would be in May of 2014.

## 2012-10-07 NOTE — Telephone Encounter (Signed)
Stent to LAD in May 2013. Asking about tooth extraction/holding plavix What is your opinion?

## 2012-10-07 NOTE — Telephone Encounter (Signed)
Pt states he has had multiple teeth extractions and needs a letter stating how long to be off of Plavix.  Please call pt to advise.

## 2012-10-08 NOTE — Telephone Encounter (Signed)
Pt informed. Understanding verb Voices frustration at this because he states, "they have to come out and dentist wont do it while I am on Plavix". I explained we cannot safely advise him to come off plavix before May of 2014. He asks if there are any other alternatives since he is in pain from these teeth?  I told him I would discuss with Dr. Kirke Corin and call him back. Understanding verb.

## 2012-10-14 ENCOUNTER — Ambulatory Visit (INDEPENDENT_AMBULATORY_CARE_PROVIDER_SITE_OTHER): Payer: BC Managed Care – PPO | Admitting: Internal Medicine

## 2012-10-14 ENCOUNTER — Encounter: Payer: Self-pay | Admitting: Internal Medicine

## 2012-10-14 VITALS — BP 128/70 | HR 57 | Temp 97.9°F | Ht 75.0 in | Wt 233.5 lb

## 2012-10-14 DIAGNOSIS — L259 Unspecified contact dermatitis, unspecified cause: Secondary | ICD-10-CM

## 2012-10-14 DIAGNOSIS — L255 Unspecified contact dermatitis due to plants, except food: Secondary | ICD-10-CM

## 2012-10-14 DIAGNOSIS — L247 Irritant contact dermatitis due to plants, except food: Secondary | ICD-10-CM | POA: Insufficient documentation

## 2012-10-14 DIAGNOSIS — Z23 Encounter for immunization: Secondary | ICD-10-CM

## 2012-10-14 MED ORDER — TRIAMCINOLONE ACETONIDE 0.1 % EX CREA: TOPICAL_CREAM | Freq: Two times a day (BID) | CUTANEOUS | Status: DC

## 2012-10-14 NOTE — Progress Notes (Signed)
Patient ID: Chase Barton, male   DOB: May 06, 1951, 61 y.o.   MRN: [id number] Patient Active Problem List  Diagnosis  . Chest pain on exertion  . Arthritis  . Systolic murmur  . Fatigue  . Aortic stenosis  . Coronary artery disease  . Hyperlipidemia  . Carotid bruit  . Tobacco abuse  . Hypertension  . Claudication of left lower extremity  . Bronchitis with chronic airway obstruction  . Contact dermatitis and eczema due to plant    Subjective:  CC:   Chief Complaint  Patient presents with  . Rash    arm    HPI:   Chase Barton a 61 y.o. male who presents Diffuse papular rash on left forearm for 12 days ago , a day or two after moving some vegetation away from a window .   Has tried calamine lotion.  No history of contact dermatitis.     Past Medical History  Diagnosis Date  . Substance abuse     remote  . Heart murmur   . Aortic stenosis 04/2012    mild-moderate  . Coronary artery disease 04/2012    Cardiac cath 04/2012: LAD, 90% proximal, RCA: 100% mid with left to right collaterals, Normal EF, Moderate aortic stenosis (peak -peak gradient of 23 mm Hg), LAD PCI with a DES placment.   . Hyperlipidemia   . Tobacco abuse   . Hypertension 06/20/2012    Past Surgical History  Procedure Date  . Cardiac catheterization          The following portions of the patient's history were reviewed and updated as appropriate: Allergies, current medications, and problem list.    Review of Systems:   12 Pt  review of systems was negative except those addressed in the HPI,     History   Social History  . Marital Status: Married    Spouse Name: N/A    Number of Children: N/A  . Years of Education: N/A   Occupational History  . Not on file.   Social History Main Topics  . Smoking status: Former Smoker -- 3.0 packs/day  . Smokeless tobacco: Current User   Comment: uses [employer]  . Alcohol Use: 0.6 oz/week    1 Glasses of wine per week     rare  . Drug  Use: No  . Sexually Active: Not on file   Other Topics Concern  . Not on file   Social History Narrative  . No narrative on file    Objective:  BP 128/70  Pulse 57  Temp 97.9 F (36.6 C)  Ht 6\' 3"  (1.905 m)  Wt 233 lb 8 oz (105.915 kg)  BMI 29.19 kg/m2  SpO2 98%  General appearance: alert, cooperative and appears stated age Ears: normal TM's and external ear canals both ears Throat: lips, mucosa, and tongue normal; teeth and gums normal Neck: no adenopathy, no carotid bruit, supple, symmetrical, trachea midline and thyroid not enlarged, symmetric, no tenderness/mass/nodules Back: symmetric, no curvature. ROM normal. No CVA tenderness. Lungs: clear to auscultation bilaterally Heart: regular rate and rhythm, S1, S2 normal, no murmur, click, rub or gallop Abdomen: soft, non-tender; bowel sounds normal; no masses,  no organomegaly Pulses: 2+ and symmetric Skin: diffuse red papular rash on left forearm  Lymph nodes: Cervical, supraclavicular, and axillary nodes normal.  Assessment and Plan:  Contact dermatitis and eczema due to plant Triamcinolone cream bid. To arm.    Updated Medication List Outpatient Encounter Prescriptions as of 10/14/2012  Medication Sig Dispense Refill  . amLODipine (NORVASC) 10 MG tablet Take 5 mg by mouth daily.      Marland Kitchen aspirin EC 81 MG tablet Take 1 tablet (81 mg total) by mouth daily.      . clopidogrel (PLAVIX) 75 MG tablet Take 75 mg by mouth daily.      . Multiple Vitamin (MULTIVITAMIN) tablet Take 1 tablet by mouth daily.      . nitroGLYCERIN (NITROSTAT) 0.4 MG SL tablet Place 1 tablet (0.4 mg total) under the tongue every 5 (five) minutes as needed for chest pain.  25 tablet  1  . NITROSTAT 0.4 MG SL tablet PLACE 1 TABLET UNDER THE TONGUE EVERY 5 MINUTES AS NEEDED FOR CHEST PAIN  25 tablet  0  . rosuvastatin (CRESTOR) 10 MG tablet Take 1 tablet (10 mg total) by mouth daily.  90 tablet  3  . sildenafil (VIAGRA) 100 MG tablet Take 100 mg by  mouth daily as needed.      . Vitamin E LIQD by Does not apply route.      Marland Kitchen albuterol (PROVENTIL HFA;VENTOLIN HFA) 108 (90 BASE) MCG/ACT inhaler Inhale 2 puffs into the lungs every 6 (six) hours as needed for wheezing.  1 Inhaler  0  . triamcinolone cream (KENALOG) 0.1 % Apply topically 2 (two) times daily.  30 g  0     Orders Placed This Encounter  Procedures  . Tdap vaccine greater than or equal to 7yo IM    No Follow-up on file.

## 2012-10-14 NOTE — Patient Instructions (Addendum)
Return for fasting lipids prior to coffee!!  Use the steroid cream twice daily for a week   Wrap arm in saran  Warp to increase absorption    You received the TdaP vaccine today  You can get the pneumonia vaccine when you return for your labs

## 2012-10-14 NOTE — Assessment & Plan Note (Signed)
Triamcinolone cream bid. To arm.

## 2012-10-15 ENCOUNTER — Telehealth: Payer: Self-pay

## 2012-10-15 NOTE — Telephone Encounter (Signed)
Just lipids this time,  Thanks

## 2012-10-17 ENCOUNTER — Other Ambulatory Visit: Payer: Self-pay

## 2012-10-17 ENCOUNTER — Ambulatory Visit (INDEPENDENT_AMBULATORY_CARE_PROVIDER_SITE_OTHER): Payer: BC Managed Care – PPO | Admitting: Internal Medicine

## 2012-10-17 ENCOUNTER — Other Ambulatory Visit (INDEPENDENT_AMBULATORY_CARE_PROVIDER_SITE_OTHER): Payer: BC Managed Care – PPO

## 2012-10-17 DIAGNOSIS — E785 Hyperlipidemia, unspecified: Secondary | ICD-10-CM

## 2012-10-17 DIAGNOSIS — Z23 Encounter for immunization: Secondary | ICD-10-CM

## 2012-10-17 LAB — LIPID PANEL
Cholesterol: 142 mg/dL (ref 0–200)
HDL: 36.5 mg/dL — ABNORMAL LOW (ref >39.00)
LDL Cholesterol: 72 mg/dL (ref 0–99)
Total CHOL/HDL Ratio: 4
Triglycerides: 167 mg/dL — ABNORMAL HIGH (ref 0.0–149.0)

## 2012-10-18 ENCOUNTER — Telehealth: Payer: Self-pay | Admitting: Internal Medicine

## 2012-10-18 NOTE — Telephone Encounter (Signed)
error 

## 2012-10-20 NOTE — Progress Notes (Signed)
Patient ID: Chase Barton, male   DOB: [birth year], 61 y.o.   MRN: [id number] Patient is here for his pneumonia vaccine

## 2012-11-11 ENCOUNTER — Other Ambulatory Visit: Payer: Self-pay

## 2012-11-11 ENCOUNTER — Ambulatory Visit (INDEPENDENT_AMBULATORY_CARE_PROVIDER_SITE_OTHER): Payer: BC Managed Care – PPO | Admitting: Internal Medicine

## 2012-11-11 ENCOUNTER — Encounter: Payer: Self-pay | Admitting: Internal Medicine

## 2012-11-11 VITALS — BP 118/72 | HR 69 | Temp 98.1°F | Resp 12 | Ht 74.5 in | Wt 232.5 lb

## 2012-11-11 DIAGNOSIS — Z720 Tobacco use: Secondary | ICD-10-CM

## 2012-11-11 DIAGNOSIS — J441 Chronic obstructive pulmonary disease with (acute) exacerbation: Secondary | ICD-10-CM

## 2012-11-11 DIAGNOSIS — F17200 Nicotine dependence, unspecified, uncomplicated: Secondary | ICD-10-CM

## 2012-11-11 MED ORDER — PREDNISONE (PAK) 10 MG PO TABS: ORAL_TABLET | ORAL | Status: DC

## 2012-11-11 MED ORDER — LEVOFLOXACIN 500 MG PO TABS: 500.0000 mg | ORAL_TABLET | Freq: Every day | ORAL | Status: DC

## 2012-11-11 MED ORDER — BUPROPION HCL 75 MG PO TABS: 75.0000 mg | ORAL_TABLET | Freq: Two times a day (BID) | ORAL | Status: DC

## 2012-11-11 MED ORDER — HYDROCOD POLST-CHLORPHEN POLST 10-8 MG/5ML PO LQCR: 10.0000 mL | Freq: Every evening | ORAL | Status: DC | PRN

## 2012-11-11 NOTE — Progress Notes (Signed)
Patient ID: Chase Barton, male   DOB: Oct 22, 1951, 61 y.o.   MRN: [id number]  Patient Active Problem List  Diagnosis  . Chest pain on exertion  . Arthritis  . Systolic murmur  . Fatigue  . Aortic stenosis  . Coronary artery disease  . Hyperlipidemia  . Carotid bruit  . Tobacco abuse  . Hypertension  . Claudication of left lower extremity  . Bronchitis with chronic airway obstruction  . Contact dermatitis and eczema due to plant  . COPD with acute exacerbation    Subjective:  CC:   Chief Complaint  Patient presents with  . Cough    HPI:   Chase Dipasqualeis a 61 y.o. male who presents Raspy throat, productive cough for the past 3 weeks.  Body aching,  Chest hurts,  Cough is productive of white /pale yellow sputum. Reports Wheezing.,  .  Has been taking mucinex  And tylenol.  Took some leftover pcn for a few days and thought it got better but symptoms have lately worsened.  Still smoking and wants to quit.    Past Medical History  Diagnosis Date  . Substance abuse     remote  . Heart murmur   . Aortic stenosis 04/2012    mild-moderate  . Coronary artery disease 04/2012    Cardiac cath 04/2012: LAD, 90% proximal, RCA: 100% mid with left to right collaterals, Normal EF, Moderate aortic stenosis (peak -peak gradient of 23 mm Hg), LAD PCI with a DES placment.   . Hyperlipidemia   . Tobacco abuse   . Hypertension 06/20/2012    Past Surgical History  Procedure Date  . Cardiac catheterization          The following portions of the patient's history were reviewed and updated as appropriate: Allergies, current medications, and problem list.    Review of Systems:   12 Pt  review of systems was negative except those addressed in the HPI,     History   Social History  . Marital Status: Married    Spouse Name: N/A    Number of Children: N/A  . Years of Education: N/A   Occupational History  . Not on file.   Social History Main Topics  . Smoking status:  Current Every Day Smoker -- 3.0 packs/day  . Smokeless tobacco: Current User     Comment: uses [employer]  . Alcohol Use: 0.6 oz/week    1 Glasses of wine per week     Comment: rare  . Drug Use: No  . Sexually Active: Not on file   Other Topics Concern  . Not on file   Social History Narrative  . No narrative on file    Objective:  BP 118/72  Pulse 69  Temp 98.1 F (36.7 C) (Oral)  Resp 12  Ht 6' 2.5" (1.892 m)  Wt 232 lb 8 oz (105.461 kg)  BMI 29.45 kg/m2  SpO2 97%  General appearance: alert, cooperative and appears stated age Ears: normal TM's and external ear canals both ears Throat: lips, mucosa, and tongue normal; teeth and gums normal Neck: no adenopathy, no carotid bruit, supple, symmetrical, trachea midline and thyroid not enlarged, symmetric, no tenderness/mass/nodules Back: symmetric, no curvature. ROM normal. No CVA tenderness. Lungs: wheezing bilaterally.  GAM,  No egohpny, rales or ronchi Heart: regular rate and rhythm, S1, S2 normal, no murmur, click, rub or gallop Abdomen: soft, non-tender; bowel sounds normal; no masses,  no organomegaly Pulses: 2+ and symmetric Skin: Skin color, texture,  turgor normal. No rashes or lesions Lymph nodes: Cervical, supraclavicular, and axillary nodes normal.  Assessment and Plan:  COPD with acute exacerbation Symptoms of  COPD are caused by ongoing tobacco abuse and viral infection currently given current symptoms.   I have explained that in viral URIS, an antibiotic will not help the symptoms and will increase the risk of developing diarrhea. Advised to use oral and nasal decongestants,  Ibuprofen 400 mg and tylenol 650 mq 8 hrs for aches and pains,  tessalon every 8 hours prn cough  Advised to start round of abx only if symptoms worsen to include fevers, facial pain, purulent sputum./drainage.   Tobacco abuse Spent 3 minutes discussing risk of continued tobacco abuse, including but not limited to worsening CAD, PAD,  hypertension, and CA.  He is  interested in pharmacotherapy at this time. wellbutrin trial discussed and initiated.    Updated Medication List Outpatient Encounter Prescriptions as of 11/11/2012  Medication Sig Dispense Refill  . albuterol (PROVENTIL HFA;VENTOLIN HFA) 108 (90 BASE) MCG/ACT inhaler Inhale 2 puffs into the lungs every 6 (six) hours as needed for wheezing.  1 Inhaler  0  . amLODipine (NORVASC) 10 MG tablet Take 5 mg by mouth daily.      Marland Kitchen aspirin EC 81 MG tablet Take 1 tablet (81 mg total) by mouth daily.      . clopidogrel (PLAVIX) 75 MG tablet Take 75 mg by mouth daily.      . Multiple Vitamin (MULTIVITAMIN) tablet Take 1 tablet by mouth daily.      . nitroGLYCERIN (NITROSTAT) 0.4 MG SL tablet Place 1 tablet (0.4 mg total) under the tongue every 5 (five) minutes as needed for chest pain.  25 tablet  1  . NITROSTAT 0.4 MG SL tablet PLACE 1 TABLET UNDER THE TONGUE EVERY 5 MINUTES AS NEEDED FOR CHEST PAIN  25 tablet  0  . rosuvastatin (CRESTOR) 10 MG tablet Take 1 tablet (10 mg total) by mouth daily.  90 tablet  3  . sildenafil (VIAGRA) 100 MG tablet Take 100 mg by mouth daily as needed.      . triamcinolone cream (KENALOG) 0.1 % Apply topically 2 (two) times daily.  30 g  0  . Vitamin E LIQD by Does not apply route.      Marland Kitchen buPROPion (WELLBUTRIN) 75 MG tablet Take 1 tablet (75 mg total) by mouth 2 (two) times daily.  60 tablet  1  . chlorpheniramine-HYDROcodone (TUSSIONEX) 10-8 MG/5ML LQCR Take 10 mLs by mouth at bedtime as needed.  180 mL  0  . levofloxacin (LEVAQUIN) 500 MG tablet Take 1 tablet (500 mg total) by mouth daily.  7 tablet  0  . predniSONE (STERAPRED UNI-PAK) 10 MG tablet 6 tablets on Day 1 , then reduce by 1 tablet daily until gone  21 tablet  0     No orders of the defined types were placed in this encounter.    No Follow-up on file.

## 2012-11-11 NOTE — Patient Instructions (Addendum)
I am prescribing you a prednisone taper, and an albuterol inhaler to use eery 6 hours to help with the wheezing/chest tightness  I am calling in tussionex for the nighttime cough.   Use the Robitussin DM or Delsym OTC for daytime cough  You should use sudafed PE 10 to 30 mg every 6 hours for the sinus and ear congestionand benadryl 25 mg every 8 hours for the drainage ; use Afrin nasal spray at bedtime  Instead to prevent insomnia   I also recommending using Simply Saline nasal spray twice daily to flush your sinuses out. (available OTC)  If you develop fever ( T > 100.4) , facial or ear pain,  Start the antibiotic  Quit smoking!!  We are trying wellbutrin 75 mg twice daily to help your mood and your tobacco cravings

## 2012-11-13 ENCOUNTER — Encounter: Payer: Self-pay | Admitting: Internal Medicine

## 2012-11-13 DIAGNOSIS — J441 Chronic obstructive pulmonary disease with (acute) exacerbation: Secondary | ICD-10-CM | POA: Insufficient documentation

## 2012-11-13 DIAGNOSIS — J449 Chronic obstructive pulmonary disease, unspecified: Secondary | ICD-10-CM | POA: Insufficient documentation

## 2012-11-13 NOTE — Assessment & Plan Note (Signed)
Symptoms of  COPD are caused by ongoing tobacco abuse and viral infection currently given current symptoms.   I have explained that in viral URIS, an antibiotic will not help the symptoms and will increase the risk of developing diarrhea. Advised to use oral and nasal decongestants,  Ibuprofen 400 mg and tylenol 650 mq 8 hrs for aches and pains,  tessalon every 8 hours prn cough  Advised to start round of abx only if symptoms worsen to include fevers, facial pain, purulent sputum./drainage.

## 2012-11-13 NOTE — Assessment & Plan Note (Signed)
Spent 3 minutes discussing risk of continued tobacco abuse, including but not limited to worsening CAD, PAD, hypertension, and CA.  He is  interested in pharmacotherapy at this time. wellbutrin trial discussed and initiated.

## 2015-01-18 ENCOUNTER — Ambulatory Visit: Payer: Self-pay | Admitting: Cardiovascular Disease

## 2015-01-28 ENCOUNTER — Ambulatory Visit (INDEPENDENT_AMBULATORY_CARE_PROVIDER_SITE_OTHER): Payer: Medicare Other | Admitting: Cardiovascular Disease

## 2015-01-28 ENCOUNTER — Other Ambulatory Visit (INDEPENDENT_AMBULATORY_CARE_PROVIDER_SITE_OTHER): Payer: Medicare Other

## 2015-01-28 ENCOUNTER — Encounter: Payer: Self-pay | Admitting: Cardiovascular Disease

## 2015-01-28 ENCOUNTER — Other Ambulatory Visit: Payer: Self-pay

## 2015-01-28 VITALS — BP 120/62 | HR 56 | Ht 74.0 in | Wt 226.2 lb

## 2015-01-28 DIAGNOSIS — I25118 Atherosclerotic heart disease of native coronary artery with other forms of angina pectoris: Secondary | ICD-10-CM

## 2015-01-28 DIAGNOSIS — I350 Nonrheumatic aortic (valve) stenosis: Secondary | ICD-10-CM

## 2015-01-28 DIAGNOSIS — I10 Essential (primary) hypertension: Secondary | ICD-10-CM

## 2015-01-28 DIAGNOSIS — R079 Chest pain, unspecified: Secondary | ICD-10-CM

## 2015-01-28 DIAGNOSIS — E785 Hyperlipidemia, unspecified: Secondary | ICD-10-CM

## 2015-01-28 NOTE — Patient Instructions (Signed)
Echocardiogram was ordered today. No medication changes were made today. Follow up with [name] in 12 months.

## 2015-01-28 NOTE — Progress Notes (Signed)
HPI  This is a 64 year old male with history of moderate aortic stenosis, coronary artery disease, occluded RCA with collaterals from left to right,  stent placement to his LAD in May of 2013, history of smoking cigars, strong family history of coronary artery disease, hypertension, who presents for a followup visit.   he moved to [facility] and has not been seen by me since 2013. He was seen by [facility] cardiology consultants. He had an attempted RCA PCI by Dr. Joycelyn Man in 2014 with an antegrade and retrograde approach but was not successful in crossing the occlusion.  Most recent echocardiogram showed normal LV systolic function with moderate aortic valve stenosis with a mean gradient of 27 mmHg an valve area of 1.2 cm .  His symptoms include occasional substernal chest pain responded to nitroglycerin. He uses nitroglycerin on average of once or twice a week but he tends to have good days and bad days according to him. Overall, he does not feel limited by his symptoms.   No Known Allergies   Current Outpatient Prescriptions on File Prior to Visit  Medication Sig Dispense Refill  . aspirin EC 81 MG tablet Take 1 tablet (81 mg total) by mouth daily.    . clopidogrel (PLAVIX) 75 MG tablet Take 75 mg by mouth daily.    . nitroGLYCERIN (NITROSTAT) 0.4 MG SL tablet Place 1 tablet (0.4 mg total) under the tongue every 5 (five) minutes as needed for chest pain. 25 tablet 1  . rosuvastatin (CRESTOR) 10 MG tablet Take 1 tablet (10 mg total) by mouth daily. 90 tablet 3  . sildenafil (VIAGRA) 100 MG tablet Take 100 mg by mouth daily as needed.     No current facility-administered medications on file prior to visit.     Past Medical History  Diagnosis Date  . Substance abuse     remote  . Heart murmur   . Aortic stenosis 04/2012    mild-moderate  . Coronary artery disease 04/2012    Cardiac cath 04/2012: LAD, 90% proximal, RCA: 100% mid with left to right collaterals,  Normal EF, Moderate aortic stenosis (peak -peak gradient of 23 mm Hg), LAD PCI with a DES placment.   . Hyperlipidemia   . Tobacco abuse   . Hypertension 06/20/2012     Past Surgical History  Procedure Laterality Date  . Cardiac catheterization      x1 stent  . Cardiac catheterization    . Cardiac catheterization       Family History  Problem Relation Age of Onset  . Heart disease Mother     s/p 4 vessel CABG x 2  . Heart disease Father     s/p CABG  . Cancer Father     metastatic prostate CA  . Cancer Brother 51    Lung     History   Social History  . Marital Status: Married    Spouse Name: N/A    Number of Children: N/A  . Years of Education: N/A   Occupational History  . Not on file.   Social History Main Topics  . Smoking status: Current Every Day Smoker -- 3.00 packs/day    Types: Cigars  . Smokeless tobacco: Current User     Comment: uses [employer]  . Alcohol Use: 0.6 oz/week    1 Glasses of wine per week     Comment: rare  . Drug Use: No  . Sexual Activity: Not on file   Other  Topics Concern  . Not on file   Social History Narrative     PHYSICAL EXAM   BP 120/62 mmHg  Pulse 56  Ht 6\' 2"  (1.88 m)  Wt 226 lb 4 oz (102.626 kg)  BMI 29.04 kg/m2  Constitutional: He is oriented to person, place, and time. He appears well-developed and well-nourished. No distress.  HENT: No nasal discharge.  Head: Normocephalic and atraumatic.  Eyes: Pupils are equal and round. Right eye exhibits no discharge. Left eye exhibits no discharge.  Neck: Normal range of motion. Neck supple. No JVD present. No thyromegaly present.  Cardiovascular: Normal rate, regular rhythm, normal heart sounds and. Exam reveals no gallop and no friction rub. There is a 2/6 systolic ejection murmur at the aortic area which is mid peaking.  Pulmonary/Chest: Effort normal and breath sounds normal. No stridor. No respiratory distress. He has no wheezes. He has no rales. He exhibits no  tenderness.  Abdominal: Soft. Bowel sounds are normal. He exhibits no distension. There is no tenderness. There is no rebound and no guarding.  Musculoskeletal: Normal range of motion. He exhibits no edema and no tenderness.  Neurological: He is alert and oriented to person, place, and time. Coordination normal.  Skin: Skin is warm and dry. No rash noted. He is not diaphoretic. No erythema. No pallor.  Psychiatric: He has a normal mood and affect. His behavior is normal. Judgment and thought content normal.     EKG: Sinus  Bradycardia  WITHIN NORMAL LIMITS     ASSESSMENT AND PLAN

## 2015-01-30 NOTE — Assessment & Plan Note (Addendum)
The patient has stable anginal symptoms. PCI of RCA CTO was not successful. Repeated attempt was suggested. However, this will depend on his overall symptoms. Currently, he seems to have class II angina. It might be reasonable to wait until aortic stenosis is severe and then proceed with aortic valve replacement and one-vessel CABG. He is currently on optimal medical therapy.

## 2015-01-30 NOTE — Assessment & Plan Note (Signed)
This was moderate on most recent echocardiogram. I recommend a repeat echocardiogram today.

## 2015-01-30 NOTE — Assessment & Plan Note (Signed)
Blood pressure is well controlled on current medications. 

## 2015-01-30 NOTE — Assessment & Plan Note (Signed)
Continue treatment with rosuvastatin with a target LDL of less than 70.

## 2015-02-02 ENCOUNTER — Telehealth: Payer: Self-pay | Admitting: *Deleted

## 2015-02-02 DIAGNOSIS — I350 Nonrheumatic aortic (valve) stenosis: Secondary | ICD-10-CM

## 2015-02-02 NOTE — Telephone Encounter (Signed)
-----   Message from Wellington Hampshire, MD sent at 01/30/2015  9:48 AM EST ----- Inform patient that echo was fine. Normal EF with stable moderate aortic stenosis. Recommend repeat echocardiogram in one year.

## 2015-04-18 NOTE — Consult Note (Signed)
Chief Complaint:   Subjective/Chief Complaint No complaints this am.  No pain radial cath site.   VITAL SIGNS/ANCILLARY NOTES: **Vital Signs.:   11-May-13 07:36   Vital Signs Type Routine   Pulse Pulse 58   Pulse source per cardiac monitor   Respirations Respirations 15   Systolic BP Systolic BP 924   Diastolic BP (mmHg) Diastolic BP (mmHg) 61   Mean BP 80   Pulse Ox % Pulse Ox % 96   Pulse Ox Activity Level  At rest   Oxygen Delivery Room Air/ 21 %   Pulse Ox Heart Rate 50   Brief Assessment:   Cardiac Regular  no murmur  -- carotid bruits  -- LE edema  -- JVD    Respiratory normal resp effort  clear BS    Gastrointestinal details normal Soft  Nontender  Nondistended   Routine Chem:  11-May-13 01:52    Glucose, Serum 102   BUN 21   Creatinine (comp) 1.01   Sodium, Serum 144   Potassium, Serum 4.0   Chloride, Serum 110   CO2, Serum 29   Calcium (Total), Serum 8.2   Anion Gap 5   Osmolality (calc) 290   eGFR (African American) >60   eGFR (Non-African American) >60   Radiology Results: Cardiac Catherization:    10-May-13 15:09, Cardiac Catheterization   Cardiac Catheterization    [facility]  [place], [place] [address]  [phone]     Cardiovascular Catheterization Comprehensive Report     Patient: Chase Barton  Study date: 05/03/2012  MR number: [id number]  Account number: [id number]     DOB: 1951/01/30  Age: 64 years  Gender: Male  Race: White  Height: 74 in  Weight: 222.6 lb     Interventional Cardiologist:  [name], MD     SUMMARY:     -HEMODYNAMICS: Hemodynamic assessment demonstrates mild systemic  hypertension and moderately elevated LVEDP.     -CORONARY CIRCULATION: There was severe 2-vessel coronary artery  disease. The RCA appears to be chronically occluded with good left to  right collaterals. Mid LAD: There was a tubular 90 % stenosis. There  was TIMI grade 3 flow throughthe vessel (brisk flow). This  is a  likely culprit for the patient's clinical presentation. An  intervention was performed.     -CARDIAC STRUCTURES: Global left ventricular function was normal. EF  estimated was 60 %. There was moderate aortic stenosis. Peak to peak  gradient of 23 mm Hg.     -1ST LESION INTERVENTIONS: A drug-eluting stent was performed on the  90 % lesion in the mid LAD. Following intervention there was a 0 %  residual stenosis.     -RECOMMENDATIONS: Patient management should include aggressive medical  therapy, a cardiac rehabilitation program, and smoking cessation.  Dual antiplatelet therapy for at least 1 year. Monitor aortic  stenosis.  The RCA seems to be a chronic occlusion with good collaterals.     HEMODYNAMICS: Hemodynamic assessment demonstrates mild systemic  hypertension and moderately elevated LVEDP.     CORONARY CIRCULATION: The coronary circulation is right dominant.  There was severe 2-vessel coronary artery disease. The RCA appears to  be chronically occluded with good left to right collaterals. Left  main: Normal. LAD: The vessel was large sized. Angiography showed the  vessel to wrap around the cardiac apex. Mid LAD: There was a tubular  90 % stenosis. There was TIMI grade 3 flow through the vessel (brisk  flow). This is a likely culprit for the patient's clinical  presentation. An intervention was performed. 1st diagonal: The vessel  was normal sized. Angiography showed minor luminal irregularities.  2nd diagonal: The vessel was very small sized. 3rd diagonal: The  vessel was very small sized. Circumflex: The vessel was normal sized.  Proximal circumflex: There was a discrete 20 % stenosis. Mid  circumflex: There was a discrete 40 % stenosis. 1st obtuse marginal:  The vessel was normal sized. Angiography showed minor luminal  irregularities. 2nd obtuse marginal: The vessel was medium sized.  Angiography showed mild atherosclerosis. 3rd obtuse marginal: The  vessel was  normal sized. There was a 20 % stenosis in the proximal  third of the vessel segment. RCA: The vessel was normal sized.  Proximal RCA: There was a 90 % stenosis. Mid RCA: There was a 100 %  stenosis.     VENTRICLES: Global left ventricular function was normal. EF estimated  was 60 %.     VALVES: AORTIC VALVE: There was moderate aortic stenosis. Peak to peak  gradient of 23 mm Hg.  RECOMMENDATIONS: -Patient management should include aggressive medical  therapy, a cardiac rehabilitation program, and smoking cessation.  Dual antiplatelet therapy for at least 1 year.Monitor aortic  stenosis.  The RCA seems to be a chronic occlusion with good collaterals.     PROCEDURES PERFORMED: Left heart catheterization with  ventriculography. Left coronary angiography. Right coronary  angiography. Intervention on mid LAD: drug-eluting stent.     PROCEDURE: The risks and alternatives of the procedures and conscious  sedation were explained to the patient and informed consent was  obtained. The patient was brought to the cath lab and placed on the  table. The planned puncture sites were prepped and draped in the  usual sterile fashion.     -ACT measurement.     -Right radial artery access. The puncture site was infiltrated with  local anesthetic. The vessel was accessed using the modified  Seldinger technique, a wire was threaded into the vessel, and a  catheter was advanced over the wire into the vessel.     -Left heart catheterization. A 5 Fr Angled pigtail catheter was  advanced to the ascending aorta. After recording ascending aortic  pressure, the catheter wasadvanced across the aortic valve and left  ventricular pressure was recorded. Ventriculography was performed  using power injection of contrast agent ( 30 ml) at 10 cc/sec at 600  PSI with a 0.4 sec rise time. Imaging was performed using an RAO  projection.     -Left coronary artery angiography. A catheter was advanced to the  aorta  and positioned in the vessel ostium under fluoroscopic  guidance. Angiography was performed in multiple projections using  hand-injection of contrast.     -Right coronary artery angiography. A catheter was advanced to the  aorta and positioned in the vessel ostium under fluoroscopic  guidance. Angiography was performed in multiple projections using  hand-injection of contrast.     LESION INTERVENTION: A drug-eluting stent was performed on the 90 %  lesion in the mid LAD. Following intervention there was a 0 %  residual stenosis. There was no dissection.     -Vessel setup was performed. A 49F XB 3 guiding catheter was used to  intubate the vessel.     -Vessel setup was performed. A Asahi Prowater 180 cm wire was used to  cross the lesion.     -Balloon angioplasty was performed, using a Rx Trek 3.0  x 39m  balloon, with 1 inflation(s) and a maximum inflation pressure of 8  atm.     -A Promus Element Plus Everolimus (DES) 3.50 x 153mdrug-eluting stent  was placed across the lesion and deployed at a maximum inflation  pressure of 12 atm.     -Balloon angioplasty was performed, using a  Trek 4.0 x 1220mballoon, with 2 inflation(s) and a maximum inflationpressure of 16  atm.     PROCEDURE COMPLETION: TIMING: Test started at 15:16. Test concluded at  16:20. RADIATION EXPOSURE: Fluoroscopy time: 13.4 min.  MEDICATIONS GIVEN: Fentanyl, 50 mcg, IV, at 15:14. Midazolam, 2 mg,  IV, at 15:14. Fentanyl, 50 mcg, IV, at 15:27. Midazolam, 1 mg, IV, at  15:48. Nitroglycerin, 200 mcg, intracoronary, at 15:58.  Nitroglycerin, 200 mcg, intracoronary, at 16:12. Heparin, 5000 units,  IV, last dose at 15:25. Clopidogrel (Plavix), 600 mg, PO, last dose  at 15:43. Aspirin, 324 mg, PO, last dose at 15:44. Angiomax Bolus, 15  ml, IV, at 15:47. Angiomax Drip, 35.4 ml, IV, at 15:47.  CONTRAST GIVEN: Isovue 170 ml.     Prepared and signed by     MuhKathlyn SacramentoD  Signed 05/03/2012 16:55:23      STUDY DIAGRAM     Angiographic findings  Native coronary lesions:   Mid LAD: Lesion 1: tubular, 90 % stenosis.   Proximal circumflex: Lesion 1: discrete, 20 % stenosis.   Mid circumflex: Lesion 1: discrete, 40 % stenosis.   OM3: Lesion 1: 20 % stenosis.   Proximal RCA: Lesion 1: 90 % stenosis.   Mid RCA: Lesion 1: 100 % stenosis.  Intervention results  Native coronary lesions:  drug-eluting stent of the 90 % stenosis in mid LAD. 0 % residual  stenosis. Stent: Promus Element Plus Everolimus (DES) 3.50 x 84m29mrug-eluting.     HEMODYNAMIC TABLES     Pressures:  Baseline  Pressures:  - HR: 60  Pressures:  - Rhythm:  Pressures:  -- Aortic Pressure (S/D/M): 124/61/80  Pressures:  -- Aortic Pressure (S/D/M): 144/61/80  Pressures:  -- Left Ventricle (s/edp): 172/28/--  Pressures:  -- Left Ventricle (s/edp): 167/29/--     Outputs:  Baseline  Outputs:  -- CALCULATIONS[age]  Outputs:  -- CALCULATIONS: Body Surface Area: 2.28  Outputs:  -- CALCULATIONS: Height in cm: 188.00  Outputs:  -- CALCULATIONS: Sex: Male  Outputs:  -- CALCULATIONS: Weight in kg: 101.20   Assessment/Plan:  Assessment/Plan:   Assessment 61 y57with history of chest pain found by cath yesterday to have severe proximal LAD stenosis and occluded RCA.  He had PCI with DES to RCA.  Today, his radial artery cath site is benign and he has no complaints.    Plan - Discharge home today on ASA 81, Plavix 75, atorvastatin 40 mg daily - Call office on Monday morning to schedule followup with Dr. AridFletcher AnonElectronic Signatures: McLeLarey Dresser)  (Signed 11-May-13 08:15)  Authored: Chief Complaint, VITAL SIGNS/ANCILLARY NOTES, Brief Assessment, Lab Results, Radiology Results, Assessment/Plan   Last Updated: 11-May-13 08:15 by McLeLarey Dresser)

## 2015-10-06 DIAGNOSIS — I350 Nonrheumatic aortic (valve) stenosis: Secondary | ICD-10-CM | POA: Insufficient documentation

## 2015-10-06 DIAGNOSIS — E785 Hyperlipidemia, unspecified: Secondary | ICD-10-CM | POA: Insufficient documentation

## 2015-10-06 DIAGNOSIS — R3129 Other microscopic hematuria: Secondary | ICD-10-CM | POA: Insufficient documentation

## 2015-12-06 ENCOUNTER — Ambulatory Visit: Payer: Medicare Other | Admitting: Cardiovascular Disease

## 2015-12-06 ENCOUNTER — Encounter: Payer: Self-pay | Admitting: Cardiovascular Disease

## 2015-12-06 ENCOUNTER — Ambulatory Visit (INDEPENDENT_AMBULATORY_CARE_PROVIDER_SITE_OTHER): Payer: Medicare Other | Admitting: Cardiovascular Disease

## 2015-12-06 VITALS — BP 120/62 | HR 61 | Ht 74.5 in | Wt 232.2 lb

## 2015-12-06 DIAGNOSIS — I10 Essential (primary) hypertension: Secondary | ICD-10-CM

## 2015-12-06 DIAGNOSIS — R079 Chest pain, unspecified: Secondary | ICD-10-CM

## 2015-12-06 DIAGNOSIS — E785 Hyperlipidemia, unspecified: Secondary | ICD-10-CM

## 2015-12-06 DIAGNOSIS — I350 Nonrheumatic aortic (valve) stenosis: Secondary | ICD-10-CM | POA: Diagnosis not present

## 2015-12-06 DIAGNOSIS — Z01812 Encounter for preprocedural laboratory examination: Secondary | ICD-10-CM | POA: Diagnosis not present

## 2015-12-06 DIAGNOSIS — I25118 Atherosclerotic heart disease of native coronary artery with other forms of angina pectoris: Secondary | ICD-10-CM | POA: Diagnosis not present

## 2015-12-06 NOTE — Assessment & Plan Note (Signed)
Blood pressure is well controlled on current medications. 

## 2015-12-06 NOTE — Progress Notes (Signed)
HPI  This is a 64 year old male with history of moderate aortic stenosis, coronary artery disease, occluded RCA with collaterals from left to right,  stent placement to his LAD in May of 2013, history of smoking cigars, strong family history of coronary artery disease, hypertension, who presents for a followup visit.  He had an attempted RCA CTO PCI by Dr. Joycelyn Man [facility]) in 2014 with an antegrade and retrograde approach but was not successful in crossing the occlusion.  Most recent echocardiogram in 01/2015  showed normal LV systolic function with moderate-severe  aortic valve stenosis with a mean gradient of 27 mmHg an valve area of 0.95 cm .   He is here for a follow-up visit after recent emergency room evaluation. He went to visit his family in [place]. While he was driving he started having substernal chest tightness which did not respond to 2 Nitroglycerin. He started feeling worse with palpitations and dizziness. His heart rate was 125 bpm. He went to the local emergency room there where he was found to have a borderline elevated troponin at 0.223. He was observed overnight and subsequent troponin went down and thus he was discharged home with instructions to follow-up with [place]. Since then, he has used nitroglycerin twice. This has been his average over the last year. Since his last visit with me in February, he reports worsening anginal symptoms and overall he feels worse. He is also "tired of" taking all these anti-angina medications.   No Known Allergies   Current Outpatient Prescriptions on File Prior to Visit  Medication Sig Dispense Refill  . aspirin EC 81 MG tablet Take 1 tablet (81 mg total) by mouth daily.    . clopidogrel (PLAVIX) 75 MG tablet Take 75 mg by mouth daily.    Marland Kitchen lisinopril (PRINIVIL,ZESTRIL) 20 MG tablet Take 10 mg by mouth daily.    . meloxicam (MOBIC) 7.5 MG tablet Take 7.5 mg by mouth daily.    . metoprolol tartrate (LOPRESSOR) 25 MG tablet Take  25 mg by mouth daily.    . nitroGLYCERIN (NITROSTAT) 0.4 MG SL tablet Place 1 tablet (0.4 mg total) under the tongue every 5 (five) minutes as needed for chest pain. 25 tablet 1  . ranolazine (RANEXA) 500 MG 12 hr tablet Take 500 mg by mouth 2 (two) times daily.    . rosuvastatin (CRESTOR) 10 MG tablet Take 1 tablet (10 mg total) by mouth daily. 90 tablet 3   No current facility-administered medications on file prior to visit.     Past Medical History  Diagnosis Date  . Substance abuse     remote  . Heart murmur   . Aortic stenosis 04/2012    mild-moderate  . Coronary artery disease 04/2012    Cardiac cath 04/2012: LAD, 90% proximal, RCA: 100% mid with left to right collaterals, Normal EF, Moderate aortic stenosis (peak -peak gradient of 23 mm Hg), LAD PCI with a DES placment.   . Hyperlipidemia   . Tobacco abuse   . Hypertension 06/20/2012     Past Surgical History  Procedure Laterality Date  . Cardiac catheterization      x1 stent  . Cardiac catheterization    . Cardiac catheterization       Family History  Problem Relation Age of Onset  . Heart disease Mother     s/p 4 vessel CABG x 2  . Heart disease Father     s/p CABG  . Cancer Father  metastatic prostate CA  . Cancer Brother 19    Lung     Social History   Social History  . Marital Status: Married    Spouse Name: N/A  . Number of Children: N/A  . Years of Education: N/A   Occupational History  . Not on file.   Social History Main Topics  . Smoking status: Current Every Day Smoker -- 3.00 packs/day    Types: Cigars  . Smokeless tobacco: Current User     Comment: uses [employer]  . Alcohol Use: 0.6 oz/week    1 Glasses of wine per week     Comment: rare  . Drug Use: No  . Sexual Activity: Not on file   Other Topics Concern  . Not on file   Social History Narrative   A 10 point review of system was performed. It is negative other than that mentioned in the history of present  illness.   PHYSICAL EXAM   BP 120/62 mmHg  Pulse 61  Ht 6' 2.5" (1.892 m)  Wt 232 lb 4 oz (105.348 kg)  BMI 29.43 kg/m2  Constitutional: He is oriented to person, place, and time. He appears well-developed and well-nourished. No distress.  HENT: No nasal discharge.  Head: Normocephalic and atraumatic.  Eyes: Pupils are equal and round. Neck: Normal range of motion. Neck supple. No JVD present. No thyromegaly present.  Cardiovascular: Normal rate, regular rhythm, normal heart sounds and. Exam reveals no gallop and no friction rub. There is a 2/6 systolic ejection murmur at the aortic area which is mid to late peaking with radiation to the carotid arteries  Pulmonary/Chest: Effort normal and breath sounds normal. No stridor. No respiratory distress. He has no wheezes. He has no rales. He exhibits no tenderness.  Abdominal: Soft. Bowel sounds are normal. He exhibits no distension. There is no tenderness. There is no rebound and no guarding.  Musculoskeletal: Normal range of motion. He exhibits no edema and no tenderness.  Neurological: He is alert and oriented to person, place, and time. Coordination normal.  Skin: Skin is warm and dry. No rash noted. He is not diaphoretic. No erythema. No pallor.  Psychiatric: He has a normal mood and affect. His behavior is normal. Judgment and thought content normal.     OVF:IEPPIR sinus rhythm with no significant ST or T wave changes.    ASSESSMENT AND PLAN

## 2015-12-06 NOTE — Assessment & Plan Note (Signed)
The patient reports significant worsening of angina since his most recent evaluation in February. He had a recent emergency room evaluation for chest pain that did not respond to nitroglycerin. Troponin was minimally elevated. He has known history of coronary artery disease with previous LAD stent and chronically occluded right coronary artery with left-to-right collaterals. He also has moderate to severe aortic stenosis which is likely contributing. Previous attempt of RCA CTO PCI in [place] was not successful. Given his worsening symptoms, I recommend proceeding with a right and left cardiac catheterization to evaluate his aortic valve as well as coronary artery disease. Most likely, he will require CABG plus aortic valve replacement. I discussed the procedure in detail as well as risks and benefits. He wants to wait until after the holidays and I gave him strict instructions of not overexerting himself.

## 2015-12-06 NOTE — Assessment & Plan Note (Signed)
Continue treatment with rosuvastatin with a target LDL of less than 70. 

## 2015-12-06 NOTE — Patient Instructions (Addendum)
Medication Instructions:  Your physician recommends that you continue on your current medications as directed. Please refer to the Current Medication list given to you today.   Labwork: BMET, CBC, PT/INR no sooner than December 28th  Testing/Procedures: A chest x-ray takes a picture of the organs and structures inside the chest, including the heart, lungs, and blood vessels. This test can show several things, including, whether the heart is enlarges; whether fluid is building up in the lungs; and whether pacemaker / defibrillator leads are still in place.  Your physician has requested that you have a cardiac catheterization. Cardiac catheterization is used to diagnose and/or treat various heart conditions. Doctors may recommend this procedure for a number of different reasons. The most common reason is to evaluate chest pain. Chest pain can be a symptom of coronary artery disease (CAD), and cardiac catheterization can show whether plaque is narrowing or blocking your heart's arteries. This procedure is also used to evaluate the valves, as well as measure the blood flow and oxygen levels in different parts of your heart. For further information please visit [email or web address]. Please follow instruction sheet, as given.  [facility] Cardiac Cath Instructions   You are scheduled for a Cardiac Cath on: Wednesday, January 11, 10:00am  Please arrive at 8:00am on the day of your procedure  Do not eat/drink anything after midnight  Someone will need to drive you home  It is recommended someone be with you for the first 24 hours after your procedure  Wear clothes that are easy to get on/off and wear slip on shoes if possible   Medications bring a current list of all medications with you  __xx_ You may take all of your medications the morning of your procedure with enough water to swallow safely    Day of your procedure: [facility] Short Stay, Entrance [place] [phone]  The usual length of stay after your procedure is about 2 to 3 hours.  This can vary.  If you have any questions, please call our office at [phone].  Follow-Up: Your physician recommends that you schedule a follow-up appointment after Jan 11th cath with [name].    Any Other Special Instructions Will Be Listed Below (If Applicable).     If you need a refill on your cardiac medications before your next appointment, please call your pharmacy.

## 2015-12-06 NOTE — Assessment & Plan Note (Signed)
This was moderate to severe on most recent evaluation. This will be evaluated with a right and left heart catheterization. Even though the severity of stenosis might not be severe or critical, I think the combination with an RCA CTO is definitely making him symptomatic.

## 2015-12-17 ENCOUNTER — Ambulatory Visit: Payer: Medicare Other | Admitting: Cardiovascular Disease

## 2015-12-28 ENCOUNTER — Telehealth: Payer: Self-pay | Admitting: *Deleted

## 2015-12-28 NOTE — Telephone Encounter (Signed)
Received phone call from Chase Barton at [facility] in [place], [place]. Pt in office for pre-cath labs and CXR . Chase Barton can not see order for CXR and requests fax to [phone] Order faxed.

## 2016-01-04 DIAGNOSIS — R9389 Abnormal findings on diagnostic imaging of other specified body structures: Secondary | ICD-10-CM | POA: Insufficient documentation

## 2016-01-04 NOTE — Telephone Encounter (Signed)
This encounter was created in error - please disregard.

## 2016-01-04 NOTE — Telephone Encounter (Signed)
Patient requested we obtain Chest Xray results from [facility] in [place] done on 12/29/15.  Faxed request to Medical records [phone] .  Awaiting return results.

## 2016-01-04 NOTE — Telephone Encounter (Signed)
Received and scanned results.

## 2016-01-05 ENCOUNTER — Encounter (HOSPITAL_COMMUNITY)
Admission: RE | Disposition: A | Discharge status: Home / Self Care | Payer: Self-pay | Source: Ambulatory Visit | Attending: Cardiovascular Disease

## 2016-01-05 ENCOUNTER — Encounter (HOSPITAL_COMMUNITY): Payer: Self-pay

## 2016-01-05 ENCOUNTER — Ambulatory Visit (HOSPITAL_COMMUNITY)
Admission: RE | Admit: 2016-01-05 | Discharge: 2016-01-05 | Disposition: A | Discharge status: Home / Self Care | Payer: Medicare Other | Source: Ambulatory Visit | Attending: Cardiovascular Disease | Admitting: Cardiovascular Disease

## 2016-01-05 DIAGNOSIS — Z955 Presence of coronary angioplasty implant and graft: Secondary | ICD-10-CM | POA: Diagnosis not present

## 2016-01-05 DIAGNOSIS — I2582 Chronic total occlusion of coronary artery: Secondary | ICD-10-CM | POA: Insufficient documentation

## 2016-01-05 DIAGNOSIS — I350 Nonrheumatic aortic (valve) stenosis: Secondary | ICD-10-CM | POA: Diagnosis not present

## 2016-01-05 DIAGNOSIS — I2510 Atherosclerotic heart disease of native coronary artery without angina pectoris: Secondary | ICD-10-CM | POA: Diagnosis present

## 2016-01-05 DIAGNOSIS — I10 Essential (primary) hypertension: Secondary | ICD-10-CM | POA: Diagnosis not present

## 2016-01-05 DIAGNOSIS — E785 Hyperlipidemia, unspecified: Secondary | ICD-10-CM | POA: Diagnosis not present

## 2016-01-05 DIAGNOSIS — Z7982 Long term (current) use of aspirin: Secondary | ICD-10-CM | POA: Insufficient documentation

## 2016-01-05 DIAGNOSIS — Z8249 Family history of ischemic heart disease and other diseases of the circulatory system: Secondary | ICD-10-CM | POA: Diagnosis not present

## 2016-01-05 DIAGNOSIS — F17290 Nicotine dependence, other tobacco product, uncomplicated: Secondary | ICD-10-CM | POA: Insufficient documentation

## 2016-01-05 DIAGNOSIS — Z7902 Long term (current) use of antithrombotics/antiplatelets: Secondary | ICD-10-CM | POA: Diagnosis not present

## 2016-01-05 HISTORY — PX: CARDIAC CATHETERIZATION: SHX172

## 2016-01-05 LAB — POCT I-STAT 3, VENOUS BLOOD GAS (G3P V)
ACID-BASE DEFICIT: 1 mmol/L (ref 0.0–2.0)
Acid-base deficit: 1 mmol/L (ref 0.0–2.0)
Bicarbonate: 24.7 mEq/L — ABNORMAL HIGH (ref 20.0–24.0)
Bicarbonate: 24.8 mEq/L — ABNORMAL HIGH (ref 20.0–24.0)
O2 Saturation: 71 %
O2 Saturation: 73 %
PCO2 VEN: 45.5 mmHg (ref 45.0–50.0)
PH VEN: 7.344 — AB (ref 7.250–7.300)
TCO2: 26 mmol/L (ref 0–100)
TCO2: 26 mmol/L (ref 0–100)
pCO2, Ven: 44.6 mmHg — ABNORMAL LOW (ref 45.0–50.0)
pH, Ven: 7.35 — ABNORMAL HIGH (ref 7.250–7.300)
pO2, Ven: 40 mmHg (ref 30.0–45.0)
pO2, Ven: 41 mmHg (ref 30.0–45.0)

## 2016-01-05 LAB — BASIC METABOLIC PANEL
Anion gap: 10 (ref 5–15)
BUN: 27 mg/dL — AB (ref 6–20)
CALCIUM: 9.3 mg/dL (ref 8.9–10.3)
CO2: 26 mmol/L (ref 22–32)
CREATININE: 1.64 mg/dL — AB (ref 0.61–1.24)
Chloride: 107 mmol/L (ref 101–111)
GFR calc Af Amer: 49 mL/min — ABNORMAL LOW (ref >60)
GFR, EST NON AFRICAN AMERICAN: 42 mL/min — AB (ref >60)
Glucose, Bld: 116 mg/dL — ABNORMAL HIGH (ref 65–99)
Potassium: 4.7 mmol/L (ref 3.5–5.1)
Sodium: 143 mmol/L (ref 135–145)

## 2016-01-05 LAB — POCT I-STAT 3, ART BLOOD GAS (G3+)
Acid-base deficit: 1 mmol/L (ref 0.0–2.0)
Bicarbonate: 24.4 mEq/L — ABNORMAL HIGH (ref 20.0–24.0)
O2 Saturation: 99 %
PCO2 ART: 42.2 mmHg (ref 35.0–45.0)
PH ART: 7.37 (ref 7.350–7.450)
TCO2: 26 mmol/L (ref 0–100)
pO2, Arterial: 120 mmHg — ABNORMAL HIGH (ref 80.0–100.0)

## 2016-01-05 LAB — PROTIME-INR
INR: 1.08 (ref 0.00–1.49)
PROTHROMBIN TIME: 14.2 s (ref 11.6–15.2)

## 2016-01-05 SURGERY — RIGHT/LEFT HEART CATH AND CORONARY ANGIOGRAPHY
Anesthesia: LOCAL

## 2016-01-05 MED ORDER — HEPARIN (PORCINE) IN NACL 2-0.9 UNIT/ML-% IJ SOLN
INTRAMUSCULAR | Status: DC | PRN
  Administered 2016-01-05: 11:00:00

## 2016-01-05 MED ORDER — LIDOCAINE HCL (PF) 1 % IJ SOLN
INTRAMUSCULAR | Status: DC | PRN
  Administered 2016-01-05: 10 mL via INTRADERMAL

## 2016-01-05 MED ORDER — LIDOCAINE HCL (PF) 1 % IJ SOLN
INTRAMUSCULAR | Status: AC
Start: 2016-01-05 — End: 2016-01-05
  Filled 2016-01-05: qty 30

## 2016-01-05 MED ORDER — MIDAZOLAM HCL 2 MG/2ML IJ SOLN
INTRAMUSCULAR | Status: DC | PRN
  Administered 2016-01-05 (×2): 1 mg via INTRAVENOUS

## 2016-01-05 MED ORDER — IOHEXOL 350 MG/ML SOLN
INTRAVENOUS | Status: DC | PRN
  Administered 2016-01-05: 45 mL via INTRA_ARTERIAL

## 2016-01-05 MED ORDER — SODIUM CHLORIDE 0.9 % IJ SOLN: 3.0000 mL | Freq: Two times a day (BID) | INTRAMUSCULAR | Status: DC

## 2016-01-05 MED ORDER — HEPARIN (PORCINE) IN NACL 2-0.9 UNIT/ML-% IJ SOLN
INTRAMUSCULAR | Status: AC
  Filled 2016-01-05: qty 1000

## 2016-01-05 MED ORDER — VERAPAMIL HCL 2.5 MG/ML IV SOLN
INTRAVENOUS | Status: AC
  Filled 2016-01-05: qty 2

## 2016-01-05 MED ORDER — SODIUM CHLORIDE 0.9 % WEIGHT BASED INFUSION: 3.0000 mL/kg/h | INTRAVENOUS | Status: DC

## 2016-01-05 MED ORDER — VERAPAMIL HCL 2.5 MG/ML IV SOLN
INTRAVENOUS | Status: DC | PRN
  Administered 2016-01-05: 11:00:00 via INTRA_ARTERIAL

## 2016-01-05 MED ORDER — FENTANYL CITRATE (PF) 100 MCG/2ML IJ SOLN
INTRAMUSCULAR | Status: DC | PRN
  Administered 2016-01-05: 50 ug via INTRAVENOUS
  Administered 2016-01-05: 25 ug via INTRAVENOUS

## 2016-01-05 MED ORDER — NITROGLYCERIN 1 MG/10 ML FOR IR/CATH LAB
INTRA_ARTERIAL | Status: AC
  Filled 2016-01-05: qty 10

## 2016-01-05 MED ORDER — SODIUM CHLORIDE 0.9 % IJ SOLN: 3.0000 mL | INTRAMUSCULAR | Status: DC | PRN

## 2016-01-05 MED ORDER — FENTANYL CITRATE (PF) 100 MCG/2ML IJ SOLN
INTRAMUSCULAR | Status: AC
  Filled 2016-01-05: qty 2

## 2016-01-05 MED ORDER — SODIUM CHLORIDE 0.9 % IV SOLN: 250.0000 mL | INTRAVENOUS | Status: DC | PRN

## 2016-01-05 MED ORDER — SODIUM CHLORIDE 0.9 % IV SOLN
INTRAVENOUS | Status: DC
  Administered 2016-01-05: 09:00:00 via INTRAVENOUS

## 2016-01-05 MED ORDER — ASPIRIN 81 MG PO CHEW: 81.0000 mg | CHEWABLE_TABLET | ORAL | Status: DC

## 2016-01-05 MED ORDER — MIDAZOLAM HCL 2 MG/2ML IJ SOLN
INTRAMUSCULAR | Status: AC
  Filled 2016-01-05: qty 2

## 2016-01-05 MED ORDER — HEPARIN SODIUM (PORCINE) 1000 UNIT/ML IJ SOLN
INTRAMUSCULAR | Status: DC | PRN
  Administered 2016-01-05: 5000 [IU] via INTRAVENOUS

## 2016-01-05 MED ORDER — HEPARIN SODIUM (PORCINE) 1000 UNIT/ML IJ SOLN
INTRAMUSCULAR | Status: AC
Start: 2016-01-05 — End: 2016-01-05
  Filled 2016-01-05: qty 1

## 2016-01-05 SURGICAL SUPPLY — 13 items
CATH BALLN WEDGE 5F 110CM (CATHETERS) ×2 IMPLANT
CATH INFINITI 5FR AL1 (CATHETERS) ×2 IMPLANT
CATH INFINITI 5FR ANG PIGTAIL (CATHETERS) ×2 IMPLANT
CATH OPTITORQUE JACKY 4.0 5F (CATHETERS) ×2 IMPLANT
DEVICE RAD COMP TR BAND LRG (VASCULAR PRODUCTS) ×2 IMPLANT
GLIDESHEATH SLEND SS 6F .021 (SHEATH) ×2 IMPLANT
KIT HEART LEFT (KITS) ×2 IMPLANT
PACK CARDIAC CATHETERIZATION (CUSTOM PROCEDURE TRAY) ×2 IMPLANT
SHEATH FAST CATH BRACH 5F 5CM (SHEATH) ×2 IMPLANT
TRANSDUCER W/STOPCOCK (MISCELLANEOUS) ×2 IMPLANT
TUBING CIL FLEX 10 FLL-RA (TUBING) ×2 IMPLANT
WIRE EMERALD ST .035X150CM (WIRE) ×2 IMPLANT
WIRE SAFE-T 1.5MM-J .035X260CM (WIRE) ×2 IMPLANT

## 2016-01-05 NOTE — Interval H&P Note (Signed)
History and Physical Interval Note:  01/05/2016 10:15 AM  Chase Barton  has presented today for surgery, with the diagnosis of aortic valve disease  The various methods of treatment have been discussed with the patient and family. After consideration of risks, benefits and other options for treatment, the patient has consented to  Procedure(s): Right/Left Heart Cath and Coronary Angiography (N/A) as a surgical intervention .  The patient's history has been reviewed, patient examined, no change in status, stable for surgery.  I have reviewed the patient's chart and labs.  Questions were answered to the patient's satisfaction.     [name]

## 2016-01-05 NOTE — Discharge Instructions (Signed)
Avoid all nonsteroidal antiinflammatory medications such as Ibuprofen, motrin and Aleve. You can use Tylenol as needed.   Radial Site Care Refer to this sheet in the next few weeks. These instructions provide you with information about caring for yourself after your procedure. Your health care provider may also give you more specific instructions. Your treatment has been planned according to current medical practices, but problems sometimes occur. Call your health care provider if you have any problems or questions after your procedure. WHAT TO EXPECT AFTER THE PROCEDURE After your procedure, it is typical to have the following:  Bruising at the radial site that usually fades within 1-2 weeks.  Blood collecting in the tissue (hematoma) that may be painful to the touch. It should usually decrease in size and tenderness within 1-2 weeks. HOME CARE INSTRUCTIONS  Take medicines only as directed by your health care provider.  You may shower 24-48 hours after the procedure or as directed by your health care provider. Remove the bandage (dressing) and gently wash the site with plain soap and water. Pat the area dry with a clean towel. Do not rub the site, because this may cause bleeding.  Do not take baths, swim, or use a hot tub until your health care provider approves.  Check your insertion site every day for redness, swelling, or drainage.  Do not apply powder or lotion to the site.  Do not flex or bend the affected arm for 24 hours or as directed by your health care provider.  Do not push or pull heavy objects with the affected arm for 24 hours or as directed by your health care provider.  Do not lift over 10 lb (4.5 kg) for 5 days after your procedure or as directed by your health care provider.  Ask your health care provider when it is okay to:  Return to work or school.  Resume usual physical activities or sports.  Resume sexual activity.  Do not drive home if you are discharged  the same day as the procedure. Have someone else drive you.  You may drive 24 hours after the procedure unless otherwise instructed by your health care provider.  Do not operate machinery or power tools for 24 hours after the procedure.  If your procedure was done as an outpatient procedure, which means that you went home the same day as your procedure, a responsible adult should be with you for the first 24 hours after you arrive home.  Keep all follow-up visits as directed by your health care provider. This is important. SEEK MEDICAL CARE IF:  You have a fever.  You have chills.  You have increased bleeding from the radial site. Hold pressure on the site. SEEK IMMEDIATE MEDICAL CARE IF:  You have unusual pain at the radial site.  You have redness, warmth, or swelling at the radial site.  You have drainage (other than a small amount of blood on the dressing) from the radial site.  The radial site is bleeding, and the bleeding does not stop after 30 minutes of holding steady pressure on the site.  Your arm or hand becomes pale, cool, tingly, or numb.   This information is not intended to replace advice given to you by your health care provider. Make sure you discuss any questions you have with your health care provider.   Document Released: 01/13/2011 Document Revised: 01/01/2015 Document Reviewed: 06/29/2014 Elsevier Interactive Patient Education [employer].

## 2016-01-05 NOTE — H&P (View-Only) (Signed)
HPI  This is a 65 year old male with history of moderate aortic stenosis, coronary artery disease, occluded RCA with collaterals from left to right,  stent placement to his LAD in May of 2013, history of smoking cigars, strong family history of coronary artery disease, hypertension, who presents for a followup visit.  He had an attempted RCA CTO PCI by Dr. Joycelyn Man [facility]) in 2014 with an antegrade and retrograde approach but was not successful in crossing the occlusion.  Most recent echocardiogram in 01/2015  showed normal LV systolic function with moderate-severe  aortic valve stenosis with a mean gradient of 27 mmHg an valve area of 0.95 cm .   He is here for a follow-up visit after recent emergency room evaluation. He went to visit his family in [place]. While he was driving he started having substernal chest tightness which did not respond to 2 Nitroglycerin. He started feeling worse with palpitations and dizziness. His heart rate was 125 bpm. He went to the local emergency room there where he was found to have a borderline elevated troponin at 0.223. He was observed overnight and subsequent troponin went down and thus he was discharged home with instructions to follow-up with [place]. Since then, he has used nitroglycerin twice. This has been his average over the last year. Since his last visit with me in February, he reports worsening anginal symptoms and overall he feels worse. He is also "tired of" taking all these anti-angina medications.   No Known Allergies   Current Outpatient Prescriptions on File Prior to Visit  Medication Sig Dispense Refill  . aspirin EC 81 MG tablet Take 1 tablet (81 mg total) by mouth daily.    . clopidogrel (PLAVIX) 75 MG tablet Take 75 mg by mouth daily.    Marland Kitchen lisinopril (PRINIVIL,ZESTRIL) 20 MG tablet Take 10 mg by mouth daily.    . meloxicam (MOBIC) 7.5 MG tablet Take 7.5 mg by mouth daily.    . metoprolol tartrate (LOPRESSOR) 25 MG tablet Take  25 mg by mouth daily.    . nitroGLYCERIN (NITROSTAT) 0.4 MG SL tablet Place 1 tablet (0.4 mg total) under the tongue every 5 (five) minutes as needed for chest pain. 25 tablet 1  . ranolazine (RANEXA) 500 MG 12 hr tablet Take 500 mg by mouth 2 (two) times daily.    . rosuvastatin (CRESTOR) 10 MG tablet Take 1 tablet (10 mg total) by mouth daily. 90 tablet 3   No current facility-administered medications on file prior to visit.     Past Medical History  Diagnosis Date  . Substance abuse     remote  . Heart murmur   . Aortic stenosis 04/2012    mild-moderate  . Coronary artery disease 04/2012    Cardiac cath 04/2012: LAD, 90% proximal, RCA: 100% mid with left to right collaterals, Normal EF, Moderate aortic stenosis (peak -peak gradient of 23 mm Hg), LAD PCI with a DES placment.   . Hyperlipidemia   . Tobacco abuse   . Hypertension 06/20/2012     Past Surgical History  Procedure Laterality Date  . Cardiac catheterization      x1 stent  . Cardiac catheterization    . Cardiac catheterization       Family History  Problem Relation Age of Onset  . Heart disease Mother     s/p 4 vessel CABG x 2  . Heart disease Father     s/p CABG  . Cancer Father  metastatic prostate CA  . Cancer Brother 16    Lung     Social History   Social History  . Marital Status: Married    Spouse Name: N/A  . Number of Children: N/A  . Years of Education: N/A   Occupational History  . Not on file.   Social History Main Topics  . Smoking status: Current Every Day Smoker -- 3.00 packs/day    Types: Cigars  . Smokeless tobacco: Current User     Comment: uses [employer]  . Alcohol Use: 0.6 oz/week    1 Glasses of wine per week     Comment: rare  . Drug Use: No  . Sexual Activity: Not on file   Other Topics Concern  . Not on file   Social History Narrative   A 10 point review of system was performed. It is negative other than that mentioned in the history of present  illness.   PHYSICAL EXAM   BP 120/62 mmHg  Pulse 61  Ht 6' 2.5" (1.892 m)  Wt 232 lb 4 oz (105.348 kg)  BMI 29.43 kg/m2  Constitutional: He is oriented to person, place, and time. He appears well-developed and well-nourished. No distress.  HENT: No nasal discharge.  Head: Normocephalic and atraumatic.  Eyes: Pupils are equal and round. Neck: Normal range of motion. Neck supple. No JVD present. No thyromegaly present.  Cardiovascular: Normal rate, regular rhythm, normal heart sounds and. Exam reveals no gallop and no friction rub. There is a 2/6 systolic ejection murmur at the aortic area which is mid to late peaking with radiation to the carotid arteries  Pulmonary/Chest: Effort normal and breath sounds normal. No stridor. No respiratory distress. He has no wheezes. He has no rales. He exhibits no tenderness.  Abdominal: Soft. Bowel sounds are normal. He exhibits no distension. There is no tenderness. There is no rebound and no guarding.  Musculoskeletal: Normal range of motion. He exhibits no edema and no tenderness.  Neurological: He is alert and oriented to person, place, and time. Coordination normal.  Skin: Skin is warm and dry. No rash noted. He is not diaphoretic. No erythema. No pallor.  Psychiatric: He has a normal mood and affect. His behavior is normal. Judgment and thought content normal.     PRF:FMBWGY sinus rhythm with no significant ST or T wave changes.    ASSESSMENT AND PLAN

## 2016-01-24 ENCOUNTER — Ambulatory Visit: Payer: Medicare Other | Admitting: Cardiovascular Disease

## 2016-03-03 ENCOUNTER — Ambulatory Visit: Payer: Medicare Other | Admitting: Cardiovascular Disease

## 2016-03-30 ENCOUNTER — Other Ambulatory Visit: Payer: Self-pay

## 2016-03-30 DIAGNOSIS — R079 Chest pain, unspecified: Secondary | ICD-10-CM

## 2016-03-30 DIAGNOSIS — R0602 Shortness of breath: Secondary | ICD-10-CM

## 2016-04-14 ENCOUNTER — Other Ambulatory Visit: Payer: Medicare Other

## 2016-04-14 ENCOUNTER — Ambulatory Visit: Payer: Medicare Other | Admitting: Cardiovascular Disease

## 2016-05-18 ENCOUNTER — Other Ambulatory Visit: Payer: Self-pay

## 2016-05-18 ENCOUNTER — Encounter: Payer: Self-pay | Admitting: Cardiovascular Disease

## 2016-05-18 ENCOUNTER — Ambulatory Visit (INDEPENDENT_AMBULATORY_CARE_PROVIDER_SITE_OTHER): Payer: Medicare Other | Admitting: Cardiovascular Disease

## 2016-05-18 ENCOUNTER — Encounter (INDEPENDENT_AMBULATORY_CARE_PROVIDER_SITE_OTHER): Payer: Self-pay

## 2016-05-18 ENCOUNTER — Ambulatory Visit (INDEPENDENT_AMBULATORY_CARE_PROVIDER_SITE_OTHER): Payer: Medicare Other

## 2016-05-18 VITALS — BP 138/66 | HR 55 | Ht 74.0 in | Wt 230.5 lb

## 2016-05-18 DIAGNOSIS — R0602 Shortness of breath: Secondary | ICD-10-CM

## 2016-05-18 DIAGNOSIS — R079 Chest pain, unspecified: Secondary | ICD-10-CM | POA: Diagnosis not present

## 2016-05-18 DIAGNOSIS — I10 Essential (primary) hypertension: Secondary | ICD-10-CM | POA: Diagnosis not present

## 2016-05-18 DIAGNOSIS — I25118 Atherosclerotic heart disease of native coronary artery with other forms of angina pectoris: Secondary | ICD-10-CM | POA: Diagnosis not present

## 2016-05-18 DIAGNOSIS — R011 Cardiac murmur, unspecified: Secondary | ICD-10-CM

## 2016-05-18 MED ORDER — CELECOXIB 200 MG PO CAPS: 200.0000 mg | ORAL_CAPSULE | Freq: Every day | ORAL | Status: DC

## 2016-05-18 MED ORDER — RANOLAZINE ER 1000 MG PO TB12: 1000.0000 mg | ORAL_TABLET | Freq: Two times a day (BID) | ORAL | Status: AC

## 2016-05-18 MED ORDER — RANOLAZINE ER 1000 MG PO TB12: 1000.0000 mg | ORAL_TABLET | Freq: Two times a day (BID) | ORAL | Status: DC

## 2016-05-18 NOTE — Patient Instructions (Signed)
Medication Instructions:  Your physician has recommended you make the following change in your medication:  STOP taking plavix INCREASE ranexa to '1000mg'$  twice daily START taking Celebrex '200mg'$  once daily  Labwork: none  Testing/Procedures: none  Follow-Up: Your physician wants you to follow-up in: six months with [name].  You will receive a reminder letter in the mail two months in advance. If you don't receive a letter, please call our office to schedule the follow-up appointment.   Any Other Special Instructions Will Be Listed Below (If Applicable).     If you need a refill on your cardiac medications before your next appointment, please call your pharmacy.

## 2016-05-18 NOTE — Progress Notes (Signed)
Cardiology Office Note   Date:  05/18/2016   ID:  Chase Barton, DOB 07-02-1951, MRN [id number]  PCP:  Pcp Not In System  Cardiologist:   [name], MD   Chief Complaint  Patient presents with  . other    C/o dull chest pain and fatigue. Meds reviewed verbally with pt.      History of Present Illness: Chase Barton is a 65 y.o. male who presents for  a follow-up visit regarding coronary artery disease and aortic stenosis. He has known history of moderate aortic stenosis, coronary artery disease, occluded RCA with collaterals from left to right,  stent placement to his LAD in May of 2013, history of smoking cigars, strong family history of coronary artery disease and hypertension. He had an attempted RCA CTO PCI by Dr. Joycelyn Man [facility]) in 2014 with an antegrade and retrograde approach but was not successful in crossing the occlusion. Echocardiogram in 01/2015  showed normal LV systolic function with moderate  aortic valve stenosis with a mean gradient of 27 mmHg an valve area of 0.95 cm .   He had worsening angina early this year. I proceeded with a right and left cardiac catheterization in January 2017. Catheterization showed significant two-vessel coronary artery disease with widely patent proximal LAD stent, chronically occluded right coronary artery with left-to-right collaterals. Right heart catheterization showed no evidence of pulmonary hypertension with normal filling pressures. Aortic stenosis was moderate with a mean gradient of 21 mmHg and a valve area of 1.6.  He reports stable symptoms overall but he has not been exercising as much as before. He continues to have mild chest discomfort if he overexerts. No significant shortness of breath.  Past Medical History  Diagnosis Date  . Substance abuse     remote  . Heart murmur   . Aortic stenosis 04/2012    mild-moderate  . Coronary artery disease 04/2012    Cardiac cath 04/2012: LAD, 90%  proximal, RCA: 100% mid with left to right collaterals, Normal EF, Moderate aortic stenosis (peak -peak gradient of 23 mm Hg), LAD PCI with a DES placment.   . Hyperlipidemia   . Tobacco abuse   . Hypertension 06/20/2012    Past Surgical History  Procedure Laterality Date  . Cardiac catheterization      x1 stent  . Cardiac catheterization    . Cardiac catheterization    . Cardiac catheterization N/A 01/05/2016    Procedure: Right/Left Heart Cath and Coronary Angiography;  Surgeon: Wellington Hampshire, MD;  Location: [place] [facility];  Service: Cardiovascular;  Laterality: N/A;     Current Outpatient Prescriptions  Medication Sig Dispense Refill  . acetaminophen (TYLENOL) 500 MG tablet Take 1,000 mg by mouth daily.    Marland Kitchen aspirin EC 81 MG tablet Take 1 tablet (81 mg total) by mouth daily.    . clopidogrel (PLAVIX) 75 MG tablet Take 75 mg by mouth daily.    Marland Kitchen latanoprost (XALATAN) 0.005 % ophthalmic solution Place 1 drop into both eyes at bedtime.    Marland Kitchen lisinopril (PRINIVIL,ZESTRIL) 20 MG tablet Take 10 mg by mouth 2 (two) times daily.     . metoprolol tartrate (LOPRESSOR) 25 MG tablet Take 25 mg by mouth daily.    . nitroGLYCERIN (NITROSTAT) 0.4 MG SL tablet Place 0.4 mg under the tongue every 5 (five) minutes as needed for chest pain.    . ranolazine (RANEXA) 500 MG 12 hr tablet Take 500 mg by mouth  2 (two) times daily.    . rosuvastatin (CRESTOR) 10 MG tablet Take 10 mg by mouth daily.     No current facility-administered medications for this visit.    Allergies:   Review of patient's allergies indicates no known allergies.    Social History:  The patient  reports that he has been smoking Cigars.  He uses smokeless tobacco. He reports that he drinks about 0.6 oz of alcohol per week. He reports that he does not use illicit drugs.   Family History:  The patient's family history includes Cancer in his father; Cancer (age of onset: 59) in his brother; Heart disease in his father and  mother.    ROS:  Please see the history of present illness.   Otherwise, review of systems are positive for none.   All other systems are reviewed and negative.    PHYSICAL EXAM: VS:  BP 138/66 mmHg  Pulse 55  Ht '6\' 2"'$  (1.88 m)  Wt 230 lb 8 oz (104.554 kg)  BMI 29.58 kg/m2 , BMI Body mass index is 29.58 kg/(m^2). GEN: Well nourished, well developed, in no acute distress HEENT: normal Neck: no JVD, carotid bruits, or masses Cardiac: RRR; no rubs, or gallops,no edema , 3/6 crescendo decrescendo murmur in the aortic area which is mid peaking. Respiratory:  clear to auscultation bilaterally, normal work of breathing GI: soft, nontender, nondistended, + BS MS: no deformity or atrophy Skin: warm and dry, no rash Neuro:  Strength and sensation are intact Psych: euthymic mood, full affect   EKG:  EKG is ordered today. The ekg ordered today demonstrates sinus bradycardia with no significant ST or T wave changes.   Recent Labs: 01/05/2016: BUN 27*; Creatinine, Ser 1.64*; Potassium 4.7; Sodium 143    Lipid Panel    Component Value Date/Time   CHOL 142 10/17/2012 0955   CHOL 154 08/12/2012 0822   TRIG 167.0* 10/17/2012 0955   HDL 36.50* 10/17/2012 0955   HDL 41 08/12/2012 0822   CHOLHDL 4 10/17/2012 0955   CHOLHDL 3.8 08/12/2012 0822   VLDL 33.4 10/17/2012 0955   LDLCALC 72 10/17/2012 0955   LDLCALC 85 08/12/2012 0822      Wt Readings from Last 3 Encounters:  05/18/16 230 lb 8 oz (104.554 kg)  01/05/16 226 lb (102.513 kg)  12/06/15 232 lb 4 oz (105.348 kg)         ASSESSMENT AND PLAN:  1.  Coronary artery disease involving native coronary arteries with stable angina: The patient continues to have class II angina which I suspect is likely due to a combination of his moderate aortic stenosis and single vessel coronary artery disease with chronically occluded right coronary artery with left-to-right collaterals. His symptoms have been stable. I increased the dose of  Ranexa thousand milligrams twice daily. One-vessel CABG and aortic valve replacement can be considered if symptoms worsen. I discontinued Plavix today given that his stent placement was more than 4 years ago.   2. Aortic stenosis: He had a repeat echocardiogram today which showed slight progression of aortic stenosis. Mean gradient increased from 26 mmHg to 32 mmHg but the valve area is still slightly above 1. This is consistent with moderate aortic stenosis. Repeat echocardiogram in 6-12 months.  3. Essential hypertension: Blood pressure is reasonably controlled.  4. Hyperlipidemia: Continue treatment with rosuvastatin with a target LDL of less than 70.  5. Arthritis: Unfortunately, does not respond to acetaminophen and currently he takes Celebrex 200 mg once daily.  Disposition:   FU with me in 6 months  Signed,  [name], MD  05/18/2016 3:16 PM    [place]

## 2016-11-15 DIAGNOSIS — I471 Supraventricular tachycardia, unspecified: Secondary | ICD-10-CM | POA: Insufficient documentation

## 2016-12-08 DIAGNOSIS — Z720 Tobacco use: Secondary | ICD-10-CM | POA: Insufficient documentation

## 2016-12-25 DIAGNOSIS — I471 Supraventricular tachycardia: Secondary | ICD-10-CM

## 2016-12-25 HISTORY — DX: Supraventricular tachycardia: I47.1

## 2017-01-19 DIAGNOSIS — N529 Male erectile dysfunction, unspecified: Secondary | ICD-10-CM | POA: Diagnosis not present

## 2017-01-19 DIAGNOSIS — F419 Anxiety disorder, unspecified: Secondary | ICD-10-CM | POA: Insufficient documentation

## 2017-01-19 DIAGNOSIS — I25118 Atherosclerotic heart disease of native coronary artery with other forms of angina pectoris: Secondary | ICD-10-CM | POA: Diagnosis not present

## 2017-01-19 DIAGNOSIS — R5383 Other fatigue: Secondary | ICD-10-CM | POA: Diagnosis not present

## 2017-01-19 DIAGNOSIS — Z1211 Encounter for screening for malignant neoplasm of colon: Secondary | ICD-10-CM | POA: Diagnosis not present

## 2017-01-19 DIAGNOSIS — Z23 Encounter for immunization: Secondary | ICD-10-CM | POA: Diagnosis not present

## 2017-01-19 DIAGNOSIS — Z136 Encounter for screening for cardiovascular disorders: Secondary | ICD-10-CM | POA: Diagnosis not present

## 2017-01-19 DIAGNOSIS — N401 Enlarged prostate with lower urinary tract symptoms: Secondary | ICD-10-CM | POA: Diagnosis not present

## 2017-01-19 DIAGNOSIS — R5381 Other malaise: Secondary | ICD-10-CM | POA: Diagnosis not present

## 2017-01-19 DIAGNOSIS — Z125 Encounter for screening for malignant neoplasm of prostate: Secondary | ICD-10-CM | POA: Diagnosis not present

## 2017-03-13 DIAGNOSIS — I471 Supraventricular tachycardia: Secondary | ICD-10-CM | POA: Diagnosis not present

## 2017-03-13 DIAGNOSIS — F17200 Nicotine dependence, unspecified, uncomplicated: Secondary | ICD-10-CM | POA: Diagnosis not present

## 2017-03-13 DIAGNOSIS — F419 Anxiety disorder, unspecified: Secondary | ICD-10-CM | POA: Diagnosis not present

## 2017-03-13 DIAGNOSIS — I2510 Atherosclerotic heart disease of native coronary artery without angina pectoris: Secondary | ICD-10-CM | POA: Diagnosis not present

## 2017-03-13 DIAGNOSIS — I25118 Atherosclerotic heart disease of native coronary artery with other forms of angina pectoris: Secondary | ICD-10-CM | POA: Diagnosis not present

## 2017-03-13 DIAGNOSIS — Z79899 Other long term (current) drug therapy: Secondary | ICD-10-CM | POA: Diagnosis not present

## 2017-03-13 DIAGNOSIS — I10 Essential (primary) hypertension: Secondary | ICD-10-CM | POA: Diagnosis not present

## 2017-03-13 DIAGNOSIS — Z7982 Long term (current) use of aspirin: Secondary | ICD-10-CM | POA: Diagnosis not present

## 2017-03-13 DIAGNOSIS — I350 Nonrheumatic aortic (valve) stenosis: Secondary | ICD-10-CM | POA: Diagnosis not present

## 2017-03-13 DIAGNOSIS — Z95818 Presence of other cardiac implants and grafts: Secondary | ICD-10-CM | POA: Diagnosis not present

## 2017-03-23 DIAGNOSIS — F17210 Nicotine dependence, cigarettes, uncomplicated: Secondary | ICD-10-CM | POA: Diagnosis not present

## 2017-03-23 DIAGNOSIS — I350 Nonrheumatic aortic (valve) stenosis: Secondary | ICD-10-CM | POA: Diagnosis not present

## 2017-03-23 DIAGNOSIS — I10 Essential (primary) hypertension: Secondary | ICD-10-CM | POA: Diagnosis not present

## 2017-03-23 DIAGNOSIS — I471 Supraventricular tachycardia: Secondary | ICD-10-CM | POA: Diagnosis not present

## 2017-03-23 DIAGNOSIS — Z955 Presence of coronary angioplasty implant and graft: Secondary | ICD-10-CM | POA: Diagnosis not present

## 2017-03-23 DIAGNOSIS — I25119 Atherosclerotic heart disease of native coronary artery with unspecified angina pectoris: Secondary | ICD-10-CM | POA: Diagnosis not present

## 2017-04-17 DIAGNOSIS — I471 Supraventricular tachycardia: Secondary | ICD-10-CM | POA: Diagnosis not present

## 2017-07-02 DIAGNOSIS — Z136 Encounter for screening for cardiovascular disorders: Secondary | ICD-10-CM | POA: Diagnosis not present

## 2017-08-22 DIAGNOSIS — K635 Polyp of colon: Secondary | ICD-10-CM | POA: Diagnosis not present

## 2017-08-22 DIAGNOSIS — D120 Benign neoplasm of cecum: Secondary | ICD-10-CM | POA: Diagnosis not present

## 2017-08-22 DIAGNOSIS — I350 Nonrheumatic aortic (valve) stenosis: Secondary | ICD-10-CM | POA: Diagnosis not present

## 2017-08-22 DIAGNOSIS — D124 Benign neoplasm of descending colon: Secondary | ICD-10-CM | POA: Diagnosis not present

## 2017-08-22 DIAGNOSIS — I10 Essential (primary) hypertension: Secondary | ICD-10-CM | POA: Diagnosis not present

## 2017-08-22 DIAGNOSIS — I2510 Atherosclerotic heart disease of native coronary artery without angina pectoris: Secondary | ICD-10-CM | POA: Diagnosis not present

## 2017-08-22 DIAGNOSIS — K648 Other hemorrhoids: Secondary | ICD-10-CM | POA: Diagnosis not present

## 2017-08-22 DIAGNOSIS — K5790 Diverticulosis of intestine, part unspecified, without perforation or abscess without bleeding: Secondary | ICD-10-CM | POA: Diagnosis not present

## 2017-08-22 DIAGNOSIS — D125 Benign neoplasm of sigmoid colon: Secondary | ICD-10-CM | POA: Diagnosis not present

## 2017-08-22 DIAGNOSIS — I471 Supraventricular tachycardia: Secondary | ICD-10-CM | POA: Diagnosis not present

## 2017-08-22 DIAGNOSIS — Z1211 Encounter for screening for malignant neoplasm of colon: Secondary | ICD-10-CM | POA: Diagnosis not present

## 2017-08-22 DIAGNOSIS — K5730 Diverticulosis of large intestine without perforation or abscess without bleeding: Secondary | ICD-10-CM | POA: Diagnosis not present

## 2018-03-26 ENCOUNTER — Ambulatory Visit: Payer: Medicare Other | Admitting: Cardiovascular Disease

## 2018-07-11 DIAGNOSIS — N410 Acute prostatitis: Secondary | ICD-10-CM | POA: Diagnosis not present

## 2018-07-16 ENCOUNTER — Encounter

## 2018-07-16 ENCOUNTER — Encounter: Payer: Self-pay | Admitting: Cardiovascular Disease

## 2018-07-16 ENCOUNTER — Ambulatory Visit (INDEPENDENT_AMBULATORY_CARE_PROVIDER_SITE_OTHER): Payer: Medicare Other | Admitting: Cardiovascular Disease

## 2018-07-16 VITALS — BP 148/70 | HR 56 | Ht 74.5 in | Wt 230.5 lb

## 2018-07-16 DIAGNOSIS — I350 Nonrheumatic aortic (valve) stenosis: Secondary | ICD-10-CM | POA: Diagnosis not present

## 2018-07-16 DIAGNOSIS — I10 Essential (primary) hypertension: Secondary | ICD-10-CM | POA: Diagnosis not present

## 2018-07-16 DIAGNOSIS — E785 Hyperlipidemia, unspecified: Secondary | ICD-10-CM

## 2018-07-16 DIAGNOSIS — I2510 Atherosclerotic heart disease of native coronary artery without angina pectoris: Secondary | ICD-10-CM

## 2018-07-16 DIAGNOSIS — I471 Supraventricular tachycardia: Secondary | ICD-10-CM | POA: Diagnosis not present

## 2018-07-16 NOTE — Patient Instructions (Addendum)
Medication Instructions: Your physician recommends that you continue on your current medications as directed. Please refer to the Current Medication list given to you today.  If you need a refill on your cardiac medications before your next appointment, please call your pharmacy.   Labwork: Your provider would like for you to return when you have your echo to have the following labs drawn: FASTING BMET, CBC, Liver and Lipid.    Procedures/Testing: Your physician has requested that you have an echocardiogram. Echocardiography is a painless test that uses sound waves to create images of your heart. It provides your doctor with information about the size and shape of your heart and how well your heart's chambers and valves are working. You may receive an ultrasound enhancing agent through an IV if needed to better visualize your heart during the echo.This procedure takes approximately one hour. There are no restrictions for this procedure. This will take place at the [facility] clinic.    Follow-Up: Your physician wants you to follow-up in 6 months with [name]. You will receive a reminder letter in the mail two months in advance. If you don't receive a letter, please call our office at [phone] to schedule this follow-up appointment.   Thank you for choosing Heartcare at [facility] ([place]

## 2018-07-16 NOTE — Progress Notes (Signed)
Cardiology Office Note   Date:  07/16/2018   ID:  Chase Barton, DOB 12/16/1951, MRN [id number]  PCP:  System, Pcp Not In  Cardiologist:   [name], MD   Chief Complaint  Patient presents with  . other    [place] moved back from [place] no complaints today. Meds reviewed verbally with pt.      History of Present Illness: Chase Barton is a 67 y.o. male who presents to reestablish cardiovascular care as he moved back from [place].  He was most recently seen by me in 2017. He has known history of moderate aortic stenosis, coronary artery disease, occluded RCA with collaterals from left to right,  stent placement to his LAD in May of 2013, history of smoking cigars, strong family history of coronary artery disease and hypertension. He had an attempted RCA CTO PCI by Dr. Joycelyn Man [facility]) in 2014 with an antegrade and retrograde approach but was not successful in crossing the occlusion. Echocardiogram in 01/2015  showed normal LV systolic function with moderate  aortic valve stenosis with a mean gradient of 27 mmHg an valve area of 0.95 cm .   Most recent right and left cardiac catheterization in January 2017 showed significant two-vessel coronary artery disease with widely patent proximal LAD stent, chronically occluded right coronary artery with left-to-right collaterals. Right heart catheterization showed no evidence of pulmonary hypertension with normal filling pressures. Aortic stenosis was moderate with a mean gradient of 21 mmHg and a valve area of 1.6.  He was diagnosed with symptomatic supraventricular tachycardia in 2018 and ultimately underwent successful ablation in March 2018 with no recurrent arrhythmia since then.  He actually reports feeling significantly better since then with no further " chest pain attacks" that he used to have.  He retired recently and moved back to [place].  He has been taking his medications regularly.    Past Medical History:    Diagnosis Date  . Aortic stenosis 04/2012   mild-moderate  . Coronary artery disease 04/2012   Cardiac cath 04/2012: LAD, 90% proximal, RCA: 100% mid with left to right collaterals, Normal EF, Moderate aortic stenosis (peak -peak gradient of 23 mm Hg), LAD PCI with a DES placment.   Marland Kitchen Heart murmur   . Hyperlipidemia   . Hypertension 06/20/2012  . Substance abuse ([place])    remote  . Tobacco abuse     Past Surgical History:  Procedure Laterality Date  . CARDIAC CATHETERIZATION     x1 stent  . CARDIAC CATHETERIZATION    . CARDIAC CATHETERIZATION    . CARDIAC CATHETERIZATION N/A 01/05/2016   Procedure: Right/Left Heart Cath and Coronary Angiography;  Surgeon: Wellington Hampshire, MD;  Location: [place] [facility];  Service: Cardiovascular;  Laterality: N/A;     Current Outpatient Medications  Medication Sig Dispense Refill  . acetaminophen (TYLENOL) 500 MG tablet Take 1,000 mg by mouth daily.    Marland Kitchen aspirin EC 81 MG tablet Take 81 mg by mouth daily.    Marland Kitchen lisinopril (PRINIVIL,ZESTRIL) 20 MG tablet Take 10 mg by mouth 2 (two) times daily.     . metoprolol tartrate (LOPRESSOR) 25 MG tablet Take 25 mg by mouth daily.    . nitroGLYCERIN (NITROSTAT) 0.4 MG SL tablet Place 0.4 mg under the tongue every 5 (five) minutes as needed for chest pain.    . ranolazine (RANEXA) 1000 MG SR tablet Take 1 tablet (1,000 mg total) by mouth 2 (two) times  daily. 60 tablet 5  . rosuvastatin (CRESTOR) 10 MG tablet Take 10 mg by mouth daily.     No current facility-administered medications for this visit.     Allergies:   Patient has no known allergies.    Social History:  The patient  reports that he has been smoking cigars.  He has been smoking about 3.00 packs per day. He uses smokeless tobacco. He reports that he drinks about 0.6 oz of alcohol per week. He reports that he does not use drugs.   Family History:  The patient's family history includes Cancer in his father; Cancer (age of onset: 41) in his  brother; Heart disease in his father and mother.    ROS:  Please see the history of present illness.   Otherwise, review of systems are positive for none.   All other systems are reviewed and negative.    PHYSICAL EXAM: VS:  BP (!) 148/70 (BP Location: Left Arm, Patient Position: Sitting, Cuff Size: Normal)   Pulse (!) 56   Ht 6' 2.5" (1.892 m)   Wt 230 lb 8 oz (104.6 kg)   BMI 29.20 kg/m  , BMI Body mass index is 29.2 kg/m. GEN: Well nourished, well developed, in no acute distress  HEENT: normal  Neck: no JVD, carotid bruits, or masses Cardiac: RRR; no rubs, or gallops,no edema , 3/6 crescendo decrescendo murmur in the aortic area which is mid peaking. Respiratory:  clear to auscultation bilaterally, normal work of breathing GI: soft, nontender, nondistended, + BS MS: no deformity or atrophy  Skin: warm and dry, no rash Neuro:  Strength and sensation are intact Psych: euthymic mood, full affect   EKG:  EKG is ordered today. The ekg ordered today demonstrates sinus bradycardia with no significant ST or T wave changes.   Recent Labs: No results found for requested labs within last 8760 hours.    Lipid Panel    Component Value Date/Time   CHOL 142 10/17/2012 0955   CHOL 154 08/12/2012 0822   TRIG 167.0 (H) 10/17/2012 0955   HDL 36.50 (L) 10/17/2012 0955   HDL 41 08/12/2012 0822   CHOLHDL 4 10/17/2012 0955   VLDL 33.4 10/17/2012 0955   LDLCALC 72 10/17/2012 0955   LDLCALC 85 08/12/2012 0822      Wt Readings from Last 3 Encounters:  07/16/18 230 lb 8 oz (104.6 kg)  05/18/16 230 lb 8 oz (104.6 kg)  01/05/16 226 lb (102.5 kg)         ASSESSMENT AND PLAN:  1.  Coronary artery disease involving native coronary arteries with stable angina: He is doing very well with no anginal symptoms at the present time.  Continue medical therapy.  2. Aortic stenosis: Most recent evaluation more than a year ago revealed moderate aortic stenosis.  I requested a repeat  echocardiogram.  3. Essential hypertension: Blood pressure is reasonably controlled.  I requested routine labs.  4. Hyperlipidemia: Continue treatment with rosuvastatin with a target LDL of less than 70.  I requested lipid and liver profile.  5.  Paroxysmal supraventricular tachycardia status post successful ablation in 2018.  No recurrent arrhythmia.    Disposition:   FU with me in 6 months  Signed,  [name], MD  07/16/2018 2:13 PM    [facility]

## 2018-07-18 ENCOUNTER — Other Ambulatory Visit: Payer: Self-pay | Admitting: Cardiovascular Disease

## 2018-07-18 DIAGNOSIS — I350 Nonrheumatic aortic (valve) stenosis: Secondary | ICD-10-CM

## 2018-07-31 ENCOUNTER — Ambulatory Visit (INDEPENDENT_AMBULATORY_CARE_PROVIDER_SITE_OTHER): Payer: Medicare Other

## 2018-07-31 ENCOUNTER — Other Ambulatory Visit: Payer: Self-pay

## 2018-07-31 ENCOUNTER — Other Ambulatory Visit (INDEPENDENT_AMBULATORY_CARE_PROVIDER_SITE_OTHER): Payer: Medicare Other

## 2018-07-31 DIAGNOSIS — E785 Hyperlipidemia, unspecified: Secondary | ICD-10-CM

## 2018-07-31 DIAGNOSIS — I10 Essential (primary) hypertension: Secondary | ICD-10-CM

## 2018-07-31 DIAGNOSIS — I350 Nonrheumatic aortic (valve) stenosis: Secondary | ICD-10-CM

## 2018-08-01 LAB — BASIC METABOLIC PANEL
BUN/Creatinine Ratio: 15 (ref 10–24)
BUN: 27 mg/dL (ref 8–27)
CALCIUM: 9.4 mg/dL (ref 8.6–10.2)
CHLORIDE: 102 mmol/L (ref 96–106)
CO2: 20 mmol/L (ref 20–29)
Creatinine, Ser: 1.79 mg/dL — ABNORMAL HIGH (ref 0.76–1.27)
GFR calc Af Amer: 44 mL/min/{1.73_m2} — ABNORMAL LOW (ref >59)
GFR, EST NON AFRICAN AMERICAN: 38 mL/min/{1.73_m2} — AB (ref >59)
Glucose: 111 mg/dL — ABNORMAL HIGH (ref 65–99)
POTASSIUM: 5.1 mmol/L (ref 3.5–5.2)
Sodium: 143 mmol/L (ref 134–144)

## 2018-08-01 LAB — CBC
HEMATOCRIT: 42.5 % (ref 37.5–51.0)
HEMOGLOBIN: 14.3 g/dL (ref 13.0–17.7)
MCH: 30.2 pg (ref 26.6–33.0)
MCHC: 33.6 g/dL (ref 31.5–35.7)
MCV: 90 fL (ref 79–97)
Platelets: 202 10*3/uL (ref 150–450)
RBC: 4.74 x10E6/uL (ref 4.14–5.80)
RDW: 14 % (ref 12.3–15.4)
WBC: 9.2 10*3/uL (ref 3.4–10.8)

## 2018-08-01 LAB — HEPATIC FUNCTION PANEL
ALT: 8 IU/L (ref 0–44)
AST: 13 IU/L (ref 0–40)
Albumin: 4.5 g/dL (ref 3.6–4.8)
Alkaline Phosphatase: 67 IU/L (ref 39–117)
Bilirubin Total: 0.5 mg/dL (ref 0.0–1.2)
Bilirubin, Direct: 0.14 mg/dL (ref 0.00–0.40)
Total Protein: 6.9 g/dL (ref 6.0–8.5)

## 2018-08-01 LAB — LIPID PANEL
CHOL/HDL RATIO: 3.3 ratio (ref 0.0–5.0)
CHOLESTEROL TOTAL: 148 mg/dL (ref 100–199)
HDL: 45 mg/dL (ref >39)
LDL Calculated: 78 mg/dL (ref 0–99)
TRIGLYCERIDES: 124 mg/dL (ref 0–149)
VLDL CHOLESTEROL CAL: 25 mg/dL (ref 5–40)

## 2018-08-06 ENCOUNTER — Telehealth: Payer: Self-pay | Admitting: *Deleted

## 2018-08-06 DIAGNOSIS — N289 Disorder of kidney and ureter, unspecified: Secondary | ICD-10-CM

## 2018-08-06 NOTE — Telephone Encounter (Signed)
-----   Message from Wellington Hampshire, MD sent at 08/02/2018  4:33 PM EDT ----- Inform patient that labs were normal except for mildly abnormal kidney function.  We should refer him to nephrology to establish.  His cholesterol was good.

## 2018-08-06 NOTE — Telephone Encounter (Signed)
Left a message to call back.

## 2018-08-07 NOTE — Telephone Encounter (Signed)
Patient made aware of results and verbalized understanding.  Referral to Nephrology has been placed.

## 2018-08-29 NOTE — Telephone Encounter (Signed)
Pt states he has found a nephrologist he would like to see. Dr. Murlean Iba. Please refer to this provider.

## 2018-08-29 NOTE — Addendum Note (Signed)
Addended by: Ricci Barker on: 08/29/2018 01:40 PM   Modules accepted: Orders

## 2018-08-29 NOTE — Telephone Encounter (Signed)
Patient made aware that the new referral has been placed.

## 2018-09-05 DIAGNOSIS — L570 Actinic keratosis: Secondary | ICD-10-CM | POA: Diagnosis not present

## 2018-09-05 DIAGNOSIS — L821 Other seborrheic keratosis: Secondary | ICD-10-CM | POA: Diagnosis not present

## 2018-09-05 DIAGNOSIS — D485 Neoplasm of uncertain behavior of skin: Secondary | ICD-10-CM | POA: Diagnosis not present

## 2018-09-05 DIAGNOSIS — C44219 Basal cell carcinoma of skin of left ear and external auricular canal: Secondary | ICD-10-CM | POA: Diagnosis not present

## 2018-10-10 DIAGNOSIS — I129 Hypertensive chronic kidney disease with stage 1 through stage 4 chronic kidney disease, or unspecified chronic kidney disease: Secondary | ICD-10-CM | POA: Diagnosis not present

## 2018-10-10 DIAGNOSIS — N183 Chronic kidney disease, stage 3 (moderate): Secondary | ICD-10-CM | POA: Diagnosis not present

## 2018-10-10 DIAGNOSIS — N390 Urinary tract infection, site not specified: Secondary | ICD-10-CM | POA: Diagnosis not present

## 2018-10-10 DIAGNOSIS — Z720 Tobacco use: Secondary | ICD-10-CM | POA: Diagnosis not present

## 2018-12-02 DIAGNOSIS — C44219 Basal cell carcinoma of skin of left ear and external auricular canal: Secondary | ICD-10-CM | POA: Diagnosis not present

## 2018-12-05 DIAGNOSIS — L570 Actinic keratosis: Secondary | ICD-10-CM | POA: Diagnosis not present

## 2018-12-05 DIAGNOSIS — Z23 Encounter for immunization: Secondary | ICD-10-CM | POA: Diagnosis not present

## 2018-12-12 DIAGNOSIS — N183 Chronic kidney disease, stage 3 (moderate): Secondary | ICD-10-CM | POA: Diagnosis not present

## 2018-12-12 DIAGNOSIS — N2581 Secondary hyperparathyroidism of renal origin: Secondary | ICD-10-CM | POA: Diagnosis not present

## 2018-12-12 DIAGNOSIS — Z720 Tobacco use: Secondary | ICD-10-CM | POA: Diagnosis not present

## 2018-12-12 DIAGNOSIS — N390 Urinary tract infection, site not specified: Secondary | ICD-10-CM | POA: Diagnosis not present

## 2018-12-12 DIAGNOSIS — I129 Hypertensive chronic kidney disease with stage 1 through stage 4 chronic kidney disease, or unspecified chronic kidney disease: Secondary | ICD-10-CM | POA: Diagnosis not present

## 2019-01-07 ENCOUNTER — Telehealth: Payer: Self-pay | Admitting: Cardiovascular Disease

## 2019-01-07 NOTE — Telephone Encounter (Signed)
Patient wants to schedule Echo before recall ov.  Last Echo 07/2018 . Please advise time frame for echo fu

## 2019-01-07 NOTE — Telephone Encounter (Signed)
Patient due to be seen for 6 month f/u. Last echo was 07/2018. I don't see where repeat echo is needed. Routing to Dr Fletcher Anon for review and if echo necessary before office visit.  Then will need to call patient to schedule an appointment.

## 2019-01-16 NOTE — Telephone Encounter (Signed)
No need for echo until 07/2019 given that stenosis was moderate.

## 2019-01-17 NOTE — Telephone Encounter (Signed)
Patient verbalized his understanding. Appointment made with [name] 02/20/2019.

## 2019-02-01 DIAGNOSIS — K5792 Diverticulitis of intestine, part unspecified, without perforation or abscess without bleeding: Secondary | ICD-10-CM | POA: Diagnosis not present

## 2019-02-01 DIAGNOSIS — R1031 Right lower quadrant pain: Secondary | ICD-10-CM | POA: Diagnosis not present

## 2019-02-01 DIAGNOSIS — I129 Hypertensive chronic kidney disease with stage 1 through stage 4 chronic kidney disease, or unspecified chronic kidney disease: Secondary | ICD-10-CM | POA: Diagnosis not present

## 2019-02-01 DIAGNOSIS — K5730 Diverticulosis of large intestine without perforation or abscess without bleeding: Secondary | ICD-10-CM | POA: Diagnosis not present

## 2019-02-01 DIAGNOSIS — N189 Chronic kidney disease, unspecified: Secondary | ICD-10-CM | POA: Diagnosis not present

## 2019-02-01 DIAGNOSIS — K5732 Diverticulitis of large intestine without perforation or abscess without bleeding: Secondary | ICD-10-CM | POA: Diagnosis not present

## 2019-02-01 DIAGNOSIS — F17210 Nicotine dependence, cigarettes, uncomplicated: Secondary | ICD-10-CM | POA: Diagnosis not present

## 2019-02-20 ENCOUNTER — Ambulatory Visit (INDEPENDENT_AMBULATORY_CARE_PROVIDER_SITE_OTHER): Payer: Medicare Other | Admitting: Cardiovascular Disease

## 2019-02-20 ENCOUNTER — Encounter: Payer: Self-pay | Admitting: Cardiovascular Disease

## 2019-02-20 VITALS — BP 120/60 | HR 55 | Ht 74.0 in | Wt 233.0 lb

## 2019-02-20 DIAGNOSIS — I350 Nonrheumatic aortic (valve) stenosis: Secondary | ICD-10-CM | POA: Diagnosis not present

## 2019-02-20 DIAGNOSIS — E785 Hyperlipidemia, unspecified: Secondary | ICD-10-CM

## 2019-02-20 DIAGNOSIS — I25118 Atherosclerotic heart disease of native coronary artery with other forms of angina pectoris: Secondary | ICD-10-CM | POA: Diagnosis not present

## 2019-02-20 DIAGNOSIS — I10 Essential (primary) hypertension: Secondary | ICD-10-CM | POA: Diagnosis not present

## 2019-02-20 DIAGNOSIS — I471 Supraventricular tachycardia: Secondary | ICD-10-CM | POA: Diagnosis not present

## 2019-02-20 MED ORDER — ROSUVASTATIN CALCIUM 20 MG PO TABS: 20.0000 mg | ORAL_TABLET | Freq: Every day | ORAL | Status: DC

## 2019-02-20 NOTE — Patient Instructions (Signed)
Medication Instructions:  INCREASE the Rosuvastatin to 20 mg daily  If you need a refill on your cardiac medications before your next appointment, please call your pharmacy.   Lab work: None ordered  Testing/Procedures: Your physician has requested that you have an echocardiogram in August. Echocardiography is a painless test that uses sound waves to create images of your heart. It provides your doctor with information about the size and shape of your heart and how well your heart's chambers and valves are working. You may receive an ultrasound enhancing agent through an IV if needed to better visualize your heart during the echo.This procedure takes approximately one hour. There are no restrictions for this procedure. This will take place at the [facility] clinic.    Follow-Up: At [facility], you and your health needs are our priority.  As part of our continuing mission to provide you with exceptional heart care, we have created designated Provider Care Teams.  These Care Teams include your primary Cardiologist (physician) and Advanced Practice Providers (APPs -  Physician Assistants and Nurse Practitioners) who all work together to provide you with the care you need, when you need it. You will need a follow up appointment in 6 months.  Please call our office 2 months in advance to schedule this appointment.  You may see [name] or one of the following Advanced Practice Providers on your designated Care Team:   Murray Hodgkins, NP Christell Faith, PA-C . Marrianne Mood, PA-C

## 2019-02-20 NOTE — Progress Notes (Signed)
Cardiology Office Note   Date:  02/20/2019   ID:  Chase Barton, DOB [birth year], MRN [id number]  PCP:  System, Pcp Not In  Cardiologist:   [name], MD   Chief Complaint  Patient presents with  . other    6 month follow up. Meds reviewed by the pt. verbally. Pt. recently diagnosed with kidnes disease stage III.       History of Present Illness: Chase Barton is a 68 y.o. male who is here today for a follow-up visit regarding coronary artery disease and aortic stenosis.  He has known history of moderate aortic stenosis, coronary artery disease, occluded RCA with collaterals from left to right,  stent placement to his LAD in May of 2013, history of smoking cigars, strong family history of coronary artery disease and hypertension. He had an attempted RCA CTO PCI by Dr. Joycelyn Man [facility]) in 2014 with an antegrade and retrograde approach but was not successful in crossing the occlusion. Echocardiogram in 01/2015  showed normal LV systolic function with moderate  aortic valve stenosis with a mean gradient of 27 mmHg an valve area of 0.95 cm .   Most recent right and left cardiac catheterization in January 2017 showed significant two-vessel coronary artery disease with widely patent proximal LAD stent, chronically occluded right coronary artery with left-to-right collaterals. Right heart catheterization showed no evidence of pulmonary hypertension with normal filling pressures. Aortic stenosis was moderate with a mean gradient of 21 mmHg and a valve area of 1.6.  He was diagnosed with symptomatic supraventricular tachycardia in 2018 and ultimately underwent successful ablation in March 2018 with no recurrent arrhythmia since then.    Most recent echocardiogram in August 2019 showed an EF of 50 to 55% with stable moderate aortic stenosis and mildly dilated ascending aorta at 39 mm.  Aortic valve mean gradient was 21 mmHg with an area of 0.95.  He was diagnosed with  chronic kidney disease based on his recent blood work and was referred to nephrology.  He also had issues with diverticulitis recently.  He has been stable from a cardiac standpoint with no recent chest pain, shortness of breath or palpitations.  No dizziness.  Past Medical History:  Diagnosis Date  . Aortic stenosis 04/2012   mild-moderate  . Chronic kidney disease    Stage III  . Coronary artery disease 04/2012   Cardiac cath 04/2012: LAD, 90% proximal, RCA: 100% mid with left to right collaterals, Normal EF, Moderate aortic stenosis (peak -peak gradient of 23 mm Hg), LAD PCI with a DES placment.   . Diverticulitis   . Heart murmur   . Hyperlipidemia   . Hypertension 06/20/2012  . PSVT (paroxysmal supraventricular tachycardia) ([place]) 2018   Status post ablation in March 2018 in [place].  . Substance abuse ([place])    remote  . Tobacco abuse     Past Surgical History:  Procedure Laterality Date  . CARDIAC CATHETERIZATION     x1 stent  . CARDIAC CATHETERIZATION    . CARDIAC CATHETERIZATION    . CARDIAC CATHETERIZATION N/A 01/05/2016   Procedure: Right/Left Heart Cath and Coronary Angiography;  Surgeon: Wellington Hampshire, MD;  Location: [place] [facility];  Service: Cardiovascular;  Laterality: N/A;  . CARDIAC ELECTROPHYSIOLOGY STUDY AND ABLATION       Current Outpatient Medications  Medication Sig Dispense Refill  . acetaminophen (TYLENOL) 500 MG tablet Take 1,000 mg by mouth daily.    Marland Kitchen aspirin EC  81 MG tablet Take 81 mg by mouth daily.    Marland Kitchen lisinopril (PRINIVIL,ZESTRIL) 20 MG tablet Take 10 mg by mouth 2 (two) times daily.     . metoprolol tartrate (LOPRESSOR) 25 MG tablet Take 25 mg by mouth daily.    . nitroGLYCERIN (NITROSTAT) 0.4 MG SL tablet Place 0.4 mg under the tongue every 5 (five) minutes as needed for chest pain.    . ranolazine (RANEXA) 1000 MG SR tablet Take 1 tablet (1,000 mg total) by mouth 2 (two) times daily. 60 tablet 5  . rosuvastatin (CRESTOR) 10 MG tablet  Take 10 mg by mouth daily.     No current facility-administered medications for this visit.     Allergies:   Patient has no known allergies.    Social History:  The patient  reports that he has been smoking cigars. He has been smoking about 3.00 packs per day. He uses smokeless tobacco. He reports current alcohol use of about 1.0 standard drinks of alcohol per week. He reports that he does not use drugs.   Family History:  The patient's family history includes Cancer in his father; Cancer (age of onset: 57) in his brother; Heart disease in his father and mother.    ROS:  Please see the history of present illness.   Otherwise, review of systems are positive for none.   All other systems are reviewed and negative.    PHYSICAL EXAM: VS:  BP 120/60 (BP Location: Left Arm, Patient Position: Sitting, Cuff Size: Normal)   Pulse (!) 55   Ht 6\' 2"  (1.88 m)   Wt 233 lb (105.7 kg)   BMI 29.92 kg/m  , BMI Body mass index is 29.92 kg/m. GEN: Well nourished, well developed, in no acute distress  HEENT: normal  Neck: no JVD, carotid bruits, or masses Cardiac: RRR; no rubs, or gallops,no edema , 3/6 crescendo decrescendo murmur in the aortic area which is mid peaking. Respiratory:  clear to auscultation bilaterally, normal work of breathing GI: soft, nontender, nondistended, + BS MS: no deformity or atrophy  Skin: warm and dry, no rash Neuro:  Strength and sensation are intact Psych: euthymic mood, full affect   EKG:  EKG is ordered today. The ekg ordered today demonstrates sinus bradycardia with no significant ST or T wave changes.   Recent Labs: 07/31/2018: ALT 8; BUN 27; Creatinine, Ser 1.79; Hemoglobin 14.3; Platelets 202; Potassium 5.1; Sodium 143    Lipid Panel    Component Value Date/Time   CHOL 148 07/31/2018 0812   TRIG 124 07/31/2018 0812   HDL 45 07/31/2018 0812   CHOLHDL 3.3 07/31/2018 0812   CHOLHDL 4 10/17/2012 0955   VLDL 33.4 10/17/2012 0955   LDLCALC 78 07/31/2018  0812      Wt Readings from Last 3 Encounters:  02/20/19 233 lb (105.7 kg)  07/16/18 230 lb 8 oz (104.6 kg)  05/18/16 230 lb 8 oz (104.6 kg)         ASSESSMENT AND PLAN:  1.  Coronary artery disease involving native coronary arteries with stable angina: He is doing very well with no anginal symptoms at the present time.  Continue medical therapy.  2. Aortic stenosis: This was moderate on most recent echocardiogram in August 2019.  I requested a repeat echocardiogram to be done in August of this year.  3. Essential hypertension: Blood pressure is controlled.  4. Hyperlipidemia: Continue treatment with rosuvastatin with a target LDL of less than 70.  Lipid profile last  year showed an LDL of 78.  I increased rosuvastatin to 20 mg daily.  5.  Paroxysmal supraventricular tachycardia status post successful ablation in 2018.  No recurrent arrhythmia.  6.  Chronic kidney disease: Followed by nephrology.    Disposition:   FU with me in 6 months  Signed,  [name], MD  02/20/2019 11:12 AM    [place]

## 2019-03-12 DIAGNOSIS — N400 Enlarged prostate without lower urinary tract symptoms: Secondary | ICD-10-CM | POA: Insufficient documentation

## 2019-03-12 DIAGNOSIS — Z8719 Personal history of other diseases of the digestive system: Secondary | ICD-10-CM | POA: Diagnosis not present

## 2019-03-12 DIAGNOSIS — K5792 Diverticulitis of intestine, part unspecified, without perforation or abscess without bleeding: Secondary | ICD-10-CM | POA: Insufficient documentation

## 2019-03-12 DIAGNOSIS — I129 Hypertensive chronic kidney disease with stage 1 through stage 4 chronic kidney disease, or unspecified chronic kidney disease: Secondary | ICD-10-CM | POA: Diagnosis not present

## 2019-03-12 DIAGNOSIS — N183 Chronic kidney disease, stage 3 (moderate): Secondary | ICD-10-CM | POA: Diagnosis not present

## 2019-03-12 DIAGNOSIS — I25118 Atherosclerotic heart disease of native coronary artery with other forms of angina pectoris: Secondary | ICD-10-CM | POA: Diagnosis not present

## 2019-03-12 DIAGNOSIS — N401 Enlarged prostate with lower urinary tract symptoms: Secondary | ICD-10-CM | POA: Diagnosis not present

## 2019-06-04 DIAGNOSIS — M25552 Pain in left hip: Secondary | ICD-10-CM | POA: Diagnosis not present

## 2019-06-04 DIAGNOSIS — K5790 Diverticulosis of intestine, part unspecified, without perforation or abscess without bleeding: Secondary | ICD-10-CM | POA: Diagnosis not present

## 2019-06-18 DIAGNOSIS — N2581 Secondary hyperparathyroidism of renal origin: Secondary | ICD-10-CM | POA: Diagnosis not present

## 2019-06-18 DIAGNOSIS — I129 Hypertensive chronic kidney disease with stage 1 through stage 4 chronic kidney disease, or unspecified chronic kidney disease: Secondary | ICD-10-CM | POA: Diagnosis not present

## 2019-06-18 DIAGNOSIS — N183 Chronic kidney disease, stage 3 (moderate): Secondary | ICD-10-CM | POA: Diagnosis not present

## 2019-06-18 DIAGNOSIS — Z720 Tobacco use: Secondary | ICD-10-CM | POA: Diagnosis not present

## 2019-06-18 DIAGNOSIS — R8290 Unspecified abnormal findings in urine: Secondary | ICD-10-CM | POA: Diagnosis not present

## 2019-06-24 DIAGNOSIS — Z8719 Personal history of other diseases of the digestive system: Secondary | ICD-10-CM | POA: Diagnosis not present

## 2019-06-24 DIAGNOSIS — Z86010 Personal history of colonic polyps: Secondary | ICD-10-CM | POA: Diagnosis not present

## 2019-06-25 DIAGNOSIS — Z86010 Personal history of colonic polyps: Secondary | ICD-10-CM | POA: Insufficient documentation

## 2019-06-25 DIAGNOSIS — Z8719 Personal history of other diseases of the digestive system: Secondary | ICD-10-CM | POA: Insufficient documentation

## 2019-07-01 DIAGNOSIS — M25552 Pain in left hip: Secondary | ICD-10-CM | POA: Diagnosis not present

## 2019-07-01 DIAGNOSIS — M48062 Spinal stenosis, lumbar region with neurogenic claudication: Secondary | ICD-10-CM | POA: Diagnosis not present

## 2019-07-01 DIAGNOSIS — G8929 Other chronic pain: Secondary | ICD-10-CM | POA: Diagnosis not present

## 2019-08-14 DIAGNOSIS — I10 Essential (primary) hypertension: Secondary | ICD-10-CM | POA: Diagnosis not present

## 2019-08-14 DIAGNOSIS — Z1159 Encounter for screening for other viral diseases: Secondary | ICD-10-CM | POA: Diagnosis not present

## 2019-08-14 DIAGNOSIS — R7309 Other abnormal glucose: Secondary | ICD-10-CM | POA: Diagnosis not present

## 2019-08-14 DIAGNOSIS — E7849 Other hyperlipidemia: Secondary | ICD-10-CM | POA: Diagnosis not present

## 2019-09-18 DIAGNOSIS — M47816 Spondylosis without myelopathy or radiculopathy, lumbar region: Secondary | ICD-10-CM | POA: Insufficient documentation

## 2019-09-18 DIAGNOSIS — G8929 Other chronic pain: Secondary | ICD-10-CM | POA: Diagnosis not present

## 2019-09-18 DIAGNOSIS — M25552 Pain in left hip: Secondary | ICD-10-CM | POA: Diagnosis not present

## 2019-09-18 DIAGNOSIS — M5136 Other intervertebral disc degeneration, lumbar region: Secondary | ICD-10-CM | POA: Diagnosis not present

## 2019-09-24 DIAGNOSIS — Z1211 Encounter for screening for malignant neoplasm of colon: Secondary | ICD-10-CM | POA: Diagnosis not present

## 2019-09-24 DIAGNOSIS — K635 Polyp of colon: Secondary | ICD-10-CM | POA: Diagnosis not present

## 2019-09-24 DIAGNOSIS — D123 Benign neoplasm of transverse colon: Secondary | ICD-10-CM | POA: Diagnosis not present

## 2019-09-24 DIAGNOSIS — D125 Benign neoplasm of sigmoid colon: Secondary | ICD-10-CM | POA: Diagnosis not present

## 2019-09-24 DIAGNOSIS — K653 Choleperitonitis: Secondary | ICD-10-CM | POA: Diagnosis not present

## 2019-09-24 DIAGNOSIS — K5730 Diverticulosis of large intestine without perforation or abscess without bleeding: Secondary | ICD-10-CM | POA: Diagnosis not present

## 2019-09-24 DIAGNOSIS — Z86010 Personal history of colonic polyps: Secondary | ICD-10-CM | POA: Diagnosis not present

## 2019-09-29 DIAGNOSIS — M545 Low back pain: Secondary | ICD-10-CM | POA: Diagnosis not present

## 2019-09-29 DIAGNOSIS — M47816 Spondylosis without myelopathy or radiculopathy, lumbar region: Secondary | ICD-10-CM | POA: Diagnosis not present

## 2019-10-02 ENCOUNTER — Other Ambulatory Visit: Payer: Self-pay

## 2019-10-02 ENCOUNTER — Encounter: Payer: Self-pay | Admitting: Cardiovascular Disease

## 2019-10-02 ENCOUNTER — Ambulatory Visit (INDEPENDENT_AMBULATORY_CARE_PROVIDER_SITE_OTHER): Payer: Medicare Other | Admitting: Cardiovascular Disease

## 2019-10-02 ENCOUNTER — Ambulatory Visit (INDEPENDENT_AMBULATORY_CARE_PROVIDER_SITE_OTHER): Payer: Medicare Other

## 2019-10-02 VITALS — BP 110/70 | HR 53 | Ht 74.5 in | Wt 226.0 lb

## 2019-10-02 DIAGNOSIS — I10 Essential (primary) hypertension: Secondary | ICD-10-CM | POA: Diagnosis not present

## 2019-10-02 DIAGNOSIS — I471 Supraventricular tachycardia: Secondary | ICD-10-CM

## 2019-10-02 DIAGNOSIS — E785 Hyperlipidemia, unspecified: Secondary | ICD-10-CM | POA: Diagnosis not present

## 2019-10-02 DIAGNOSIS — Z23 Encounter for immunization: Secondary | ICD-10-CM | POA: Diagnosis not present

## 2019-10-02 DIAGNOSIS — I350 Nonrheumatic aortic (valve) stenosis: Secondary | ICD-10-CM | POA: Diagnosis not present

## 2019-10-02 DIAGNOSIS — I25118 Atherosclerotic heart disease of native coronary artery with other forms of angina pectoris: Secondary | ICD-10-CM | POA: Diagnosis not present

## 2019-10-02 NOTE — Patient Instructions (Addendum)
Medication Instructions:  Your physician recommends that you continue on your current medications as directed. Please refer to the Current Medication list given to you today.  If you need a refill on your cardiac medications before your next appointment, please call your pharmacy.   Lab work: None ordered If you have labs (blood work) drawn today and your tests are completely normal, you will receive your results only by: Marland Kitchen MyChart Message (if you have MyChart) OR . A paper copy in the mail If you have any lab test that is abnormal or we need to change your treatment, we will call you to review the results.  Testing/Procedures: None ordered  Follow-Up: At [facility], you and your health needs are our priority.  As part of our continuing mission to provide you with exceptional heart care, we have created designated Provider Care Teams.  These Care Teams include your primary Cardiologist (physician) and Advanced Practice Providers (APPs -  Physician Assistants and Nurse Practitioners) who all work together to provide you with the care you need, when you need it. You will need a follow up appointment in 12 months.  Please call our office 2 months in advance to schedule this appointment.  You may see  [name] or one of the following Advanced Practice Providers on your designated Care Team:   Murray Hodgkins, NP Christell Faith, PA-C . Marrianne Mood, PA-C  Any Other Special Instructions Will Be Listed Below (If Applicable). You have been given the influenza vaccine today

## 2019-10-02 NOTE — Progress Notes (Signed)
Cardiology Office Note   Date:  10/02/2019   ID:  Chase Barton, DOB Jun 05, 1951, MRN [id number]  PCP:  System, Pcp Not In  Cardiologist:   Chase Sacramento, MD   Chief Complaint  Patient presents with  . office visit    6 mo F/U-echo results      History of Present Illness: Chase Barton is a 68 y.o. male who is here today for a follow-up visit regarding coronary artery disease and aortic stenosis.  He has known history of moderate aortic stenosis, coronary artery disease, occluded RCA with collaterals from left to right,  stent placement to his LAD in May of 2013, history of smoking cigars, strong family history of coronary artery disease and hypertension. He had an attempted RCA CTO PCI by Dr. Joycelyn Man [facility]) in 2014 with an antegrade and retrograde approach but was not successful in crossing the occlusion. Echocardiogram in 01/2015  showed normal LV systolic function with moderate  aortic valve stenosis with a mean gradient of 27 mmHg an valve area of 0.95 cm .   Most recent right and left cardiac catheterization in January 2017 showed significant two-vessel coronary artery disease with widely patent proximal LAD stent, chronically occluded right coronary artery with left-to-right collaterals. Right heart catheterization showed no evidence of pulmonary hypertension with normal filling pressures. Aortic stenosis was moderate with a mean gradient of 21 mmHg and a valve area of 1.6.  He was diagnosed with symptomatic supraventricular tachycardia in 2018 and ultimately underwent successful ablation in March 2018 with no recurrent arrhythmia since then.    Echocardiogram in August 2019 showed an EF of 50 to 55% with stable moderate aortic stenosis and mildly dilated ascending aorta at 39 mm.  Aortic valve mean gradient was 21 mmHg with an area of 0.95.  He has been doing extremely well and denies any chest pain, shortness of breath or palpitations.  No tachycardia.   Past Medical History:  Diagnosis Date  . Aortic stenosis 04/2012   mild-moderate  . Chronic kidney disease    Stage III  . Coronary artery disease 04/2012   Cardiac cath 04/2012: LAD, 90% proximal, RCA: 100% mid with left to right collaterals, Normal EF, Moderate aortic stenosis (peak -peak gradient of 23 mm Hg), LAD PCI with a DES placment.   . Diverticulitis   . Heart murmur   . Hyperlipidemia   . Hypertension 06/20/2012  . PSVT (paroxysmal supraventricular tachycardia) ([place]) 2018   Status post ablation in March 2018 in [place].  . Substance abuse ([place])    remote  . Tobacco abuse     Past Surgical History:  Procedure Laterality Date  . CARDIAC CATHETERIZATION     x1 stent  . CARDIAC CATHETERIZATION    . CARDIAC CATHETERIZATION    . CARDIAC CATHETERIZATION N/A 01/05/2016   Procedure: Right/Left Heart Cath and Coronary Angiography;  Surgeon: Wellington Hampshire, MD;  Location: [place] [facility];  Service: Cardiovascular;  Laterality: N/A;  . CARDIAC ELECTROPHYSIOLOGY STUDY AND ABLATION       Current Outpatient Medications  Medication Sig Dispense Refill  . acetaminophen (TYLENOL) 500 MG tablet Take 1,000 mg by mouth daily.    Marland Kitchen aspirin EC 81 MG tablet Take 81 mg by mouth daily.    Marland Kitchen latanoprost (XALATAN) 0.005 % ophthalmic solution Apply 1 drop to eye daily.    Marland Kitchen lisinopril (PRINIVIL,ZESTRIL) 20 MG tablet Take 10 mg by mouth 2 (two) times daily.     Marland Kitchen  metoprolol tartrate (LOPRESSOR) 25 MG tablet Take 25 mg by mouth daily.    . nitroGLYCERIN (NITROSTAT) 0.4 MG SL tablet Place 0.4 mg under the tongue every 5 (five) minutes as needed for chest pain.    . ranolazine (RANEXA) 1000 MG SR tablet Take 1 tablet (1,000 mg total) by mouth 2 (two) times daily. 60 tablet 5  . rosuvastatin (CRESTOR) 20 MG tablet Take 1 tablet (20 mg total) by mouth daily.    . tamsulosin (FLOMAX) 0.4 MG CAPS capsule Take 0.4 mg by mouth daily.     No current facility-administered medications for this  visit.     Allergies:   Atorvastatin    Social History:  The patient  reports that he has been smoking cigars. He has been smoking about 3.00 packs per day. He uses smokeless tobacco. He reports current alcohol use of about 1.0 standard drinks of alcohol per week. He reports that he does not use drugs.   Family History:  The patient's family history includes Cancer in his father; Cancer (age of onset: 31) in his brother; Heart disease in his father and mother.    ROS:  Please see the history of present illness.   Otherwise, review of systems are positive for none.   All other systems are reviewed and negative.    PHYSICAL EXAM: VS:  BP 110/70 (BP Location: Left Arm, Patient Position: Sitting, Cuff Size: Normal)   Pulse (!) 53   Ht 6' 2.5" (1.892 m)   Wt 226 lb (102.5 kg)   SpO2 96%   BMI 28.63 kg/m  , BMI Body mass index is 28.63 kg/m. GEN: Well nourished, well developed, in no acute distress  HEENT: normal  Neck: no JVD, carotid bruits, or masses Cardiac: RRR; no rubs, or gallops,no edema , 3/6 crescendo decrescendo murmur in the aortic area which is mid peaking. Respiratory:  clear to auscultation bilaterally, normal work of breathing GI: soft, nontender, nondistended, + BS MS: no deformity or atrophy  Skin: warm and dry, no rash Neuro:  Strength and sensation are intact Psych: euthymic mood, full affect   EKG:  EKG is ordered today. The ekg ordered today demonstrates sinus bradycardia with no significant ST or T wave changes.   Recent Labs: No results found for requested labs within last 8760 hours.    Lipid Panel    Component Value Date/Time   CHOL 148 07/31/2018 0812   TRIG 124 07/31/2018 0812   HDL 45 07/31/2018 0812   CHOLHDL 3.3 07/31/2018 0812   CHOLHDL 4 10/17/2012 0955   VLDL 33.4 10/17/2012 0955   LDLCALC 78 07/31/2018 0812      Wt Readings from Last 3 Encounters:  10/02/19 226 lb (102.5 kg)  02/20/19 233 lb (105.7 kg)  07/16/18 230 lb 8 oz  (104.6 kg)         ASSESSMENT AND PLAN:  1.  Coronary artery disease involving native coronary arteries with stable angina: He is doing very well with no anginal symptoms at the present time.  Continue medical therapy.  2. Aortic stenosis: This was moderate on most recent echocardiogram in August 2019.  He had an echocardiogram done today which showed overall stability.  If anything, but gradient appears to be less at 18 mmHg with a valve area of 1.3 cm.  3. Essential hypertension: Blood pressure is controlled.  4. Hyperlipidemia: Continue treatment with rosuvastatin with a target LDL of less than 70.    5.  Paroxysmal supraventricular tachycardia status  post successful ablation in 2018.  No recurrent arrhythmia.  6.  Chronic kidney disease: Followed by nephrology.  The patient was given a flu shot today.  Disposition:   FU with me in 12 months  Signed,  Chase Sacramento, MD  10/02/2019 2:23 PM    [place]

## 2019-10-06 DIAGNOSIS — Z20828 Contact with and (suspected) exposure to other viral communicable diseases: Secondary | ICD-10-CM | POA: Diagnosis not present

## 2019-11-24 DIAGNOSIS — M47816 Spondylosis without myelopathy or radiculopathy, lumbar region: Secondary | ICD-10-CM | POA: Diagnosis not present

## 2019-11-24 DIAGNOSIS — G8929 Other chronic pain: Secondary | ICD-10-CM | POA: Diagnosis not present

## 2019-11-24 DIAGNOSIS — M545 Low back pain: Secondary | ICD-10-CM | POA: Diagnosis not present

## 2019-11-26 DIAGNOSIS — N2581 Secondary hyperparathyroidism of renal origin: Secondary | ICD-10-CM | POA: Insufficient documentation

## 2019-11-26 DIAGNOSIS — R8290 Unspecified abnormal findings in urine: Secondary | ICD-10-CM | POA: Diagnosis not present

## 2019-11-26 DIAGNOSIS — I129 Hypertensive chronic kidney disease with stage 1 through stage 4 chronic kidney disease, or unspecified chronic kidney disease: Secondary | ICD-10-CM | POA: Diagnosis not present

## 2019-11-26 DIAGNOSIS — N183 Chronic kidney disease, stage 3 unspecified: Secondary | ICD-10-CM | POA: Diagnosis present

## 2019-12-29 ENCOUNTER — Other Ambulatory Visit: Payer: Self-pay

## 2019-12-29 MED ORDER — LISINOPRIL 20 MG PO TABS: 10.0000 mg | ORAL_TABLET | Freq: Two times a day (BID) | ORAL | 0 refills | Status: DC

## 2019-12-29 MED ORDER — METOPROLOL TARTRATE 25 MG PO TABS: 25.0000 mg | ORAL_TABLET | Freq: Every day | ORAL | 0 refills | Status: DC

## 2019-12-29 MED ORDER — ROSUVASTATIN CALCIUM 20 MG PO TABS: 20.0000 mg | ORAL_TABLET | Freq: Every day | ORAL | 0 refills | Status: AC

## 2019-12-29 NOTE — Telephone Encounter (Signed)
Patient is out of town and needs a 10 day supply

## 2020-05-26 DIAGNOSIS — N2581 Secondary hyperparathyroidism of renal origin: Secondary | ICD-10-CM | POA: Diagnosis not present

## 2020-05-26 DIAGNOSIS — I129 Hypertensive chronic kidney disease with stage 1 through stage 4 chronic kidney disease, or unspecified chronic kidney disease: Secondary | ICD-10-CM | POA: Diagnosis not present

## 2020-05-26 DIAGNOSIS — N1832 Chronic kidney disease, stage 3b: Secondary | ICD-10-CM | POA: Diagnosis not present

## 2020-05-26 DIAGNOSIS — R8290 Unspecified abnormal findings in urine: Secondary | ICD-10-CM | POA: Diagnosis not present

## 2020-05-29 DIAGNOSIS — M47816 Spondylosis without myelopathy or radiculopathy, lumbar region: Secondary | ICD-10-CM | POA: Diagnosis not present

## 2020-05-29 DIAGNOSIS — N1832 Chronic kidney disease, stage 3b: Secondary | ICD-10-CM | POA: Diagnosis not present

## 2020-05-29 DIAGNOSIS — N183 Chronic kidney disease, stage 3 unspecified: Secondary | ICD-10-CM | POA: Diagnosis not present

## 2020-05-29 DIAGNOSIS — N3001 Acute cystitis with hematuria: Secondary | ICD-10-CM | POA: Diagnosis not present

## 2020-06-02 ENCOUNTER — Encounter: Payer: Self-pay | Admitting: Cardiovascular Disease

## 2020-07-20 ENCOUNTER — Other Ambulatory Visit: Payer: Self-pay

## 2020-07-20 DIAGNOSIS — I350 Nonrheumatic aortic (valve) stenosis: Secondary | ICD-10-CM

## 2020-07-23 DIAGNOSIS — M48062 Spinal stenosis, lumbar region with neurogenic claudication: Secondary | ICD-10-CM | POA: Diagnosis not present

## 2020-07-23 DIAGNOSIS — I10 Essential (primary) hypertension: Secondary | ICD-10-CM | POA: Diagnosis not present

## 2020-07-23 DIAGNOSIS — M5136 Other intervertebral disc degeneration, lumbar region: Secondary | ICD-10-CM | POA: Diagnosis not present

## 2020-07-23 DIAGNOSIS — I25118 Atherosclerotic heart disease of native coronary artery with other forms of angina pectoris: Secondary | ICD-10-CM | POA: Diagnosis not present

## 2020-09-23 ENCOUNTER — Telehealth: Payer: Self-pay | Admitting: Cardiovascular Disease

## 2020-09-23 NOTE — Telephone Encounter (Signed)
Can we expedite his echocardiogram to ensure no change in heart function or aortic stenosis?

## 2020-09-23 NOTE — Telephone Encounter (Signed)
Spoke with the patient. Patient denies weight gain or increased sob.  Patient sts that he played 18 rounds of golf on Tues 09/21/20, he had not played in a while. Patient noticed increased swelling in his ankles and feet yesterday.  Swelling is a little better today. Adv the patient to try and elevate his legs as much as possible over the next day or 2. He does have compression socks at home. Recommended that he wear his compression socks when he plans on being on his feet for prolonged periods of time.  Adv the patient to let us know if he has no improvement after trying the compression and elevation.  Patient wanted [name] to know that nephrology has d/c his BP medications. He does monitor his BP at home and it averages 130's/140's/70's.  Adv the patient that I will fwd the FYI to [name] and call back if he has additional recommendations.

## 2020-09-23 NOTE — Telephone Encounter (Signed)
Pt c/o swelling: STAT is pt has developed SOB within 24 hours  1) How much weight have you gained and in what time span? Maybe 1-2 lbs up and down   2) If swelling, where is the swelling located? Ankles   3) Are you currently taking a fluid pill?   No   4) Are you currently SOB?  Chronic a little as always   5) Do you have a log of your daily weights (if so, list)? Yes 226-227 as always   6) Have you gained 3 pounds in a day or 5 pounds in a week? No   7) Have you traveled recently? No but played golf yesterday

## 2020-09-24 NOTE — Telephone Encounter (Signed)
Patient aware of Dr. Tyrell Antonio recommendation. Adv the patient that I will fwd the message to scheduling to see if they can help expedite his echo.

## 2020-09-26 ENCOUNTER — Telehealth: Payer: Self-pay | Admitting: Cardiology

## 2020-09-26 ENCOUNTER — Telehealth: Payer: Self-pay | Admitting: Internal Medicine

## 2020-09-26 DIAGNOSIS — R9431 Abnormal electrocardiogram [ECG] [EKG]: Secondary | ICD-10-CM | POA: Diagnosis not present

## 2020-09-26 DIAGNOSIS — R0789 Other chest pain: Secondary | ICD-10-CM | POA: Diagnosis not present

## 2020-09-26 DIAGNOSIS — R079 Chest pain, unspecified: Secondary | ICD-10-CM | POA: Diagnosis not present

## 2020-09-26 DIAGNOSIS — R918 Other nonspecific abnormal finding of lung field: Secondary | ICD-10-CM | POA: Diagnosis not present

## 2020-09-26 DIAGNOSIS — J810 Acute pulmonary edema: Secondary | ICD-10-CM | POA: Diagnosis not present

## 2020-09-26 DIAGNOSIS — I493 Ventricular premature depolarization: Secondary | ICD-10-CM | POA: Diagnosis not present

## 2020-09-26 DIAGNOSIS — R0602 Shortness of breath: Secondary | ICD-10-CM | POA: Diagnosis not present

## 2020-09-26 DIAGNOSIS — F17210 Nicotine dependence, cigarettes, uncomplicated: Secondary | ICD-10-CM | POA: Diagnosis not present

## 2020-09-26 DIAGNOSIS — M7989 Other specified soft tissue disorders: Secondary | ICD-10-CM | POA: Diagnosis not present

## 2020-09-26 NOTE — Telephone Encounter (Signed)
Patient's wife called in reporting he is currently admitted to a hospital in [place] with CHF. States they want to be transferred to [facility] for further management. I advised this would have to be requested by the MD taking care of him, but [facility] has also not been accepting outside transfers with increased COVID volumes. Advised that if he is receiving care there should probably stay. She asked about bringing him to [facility], I advised he would have to go to the ED and start the admission process over. She voiced understanding.

## 2020-09-26 NOTE — Telephone Encounter (Signed)
Called by Mr. Sharma Covert Pasquali's daughter this evening. Her father went to an outside ED with SOB and chest discomfort. Was told he had pulmonary edema likely due to poorly controlled hypertension. Was given IV lasix and d/c'd home. Shortness of breath has improved some at home, but has not completely resolved. Still having mild chest pressure that improved with nitro. Raquel Sarna told me he received the lasix a few hours ago. I advised her that I would try to give it a bit more time to see if some more fluid could come off. Advised that if his SOB or chest pressure worsened he should come to the ED. They will call in the AM to report on symptoms.

## 2020-09-27 ENCOUNTER — Telehealth: Payer: Self-pay | Admitting: Cardiovascular Disease

## 2020-09-27 ENCOUNTER — Inpatient Hospital Stay (HOSPITAL_COMMUNITY)
Admission: EM | Admit: 2020-09-27 | Discharge: 2020-10-01 | DRG: 246 | Disposition: A | Discharge status: Home / Self Care | Payer: No Typology Code available for payment source | Attending: Cardiology | Admitting: Cardiology

## 2020-09-27 ENCOUNTER — Emergency Department (HOSPITAL_COMMUNITY): Payer: No Typology Code available for payment source

## 2020-09-27 ENCOUNTER — Other Ambulatory Visit: Payer: Self-pay

## 2020-09-27 ENCOUNTER — Encounter (HOSPITAL_COMMUNITY): Payer: Self-pay

## 2020-09-27 DIAGNOSIS — Z6829 Body mass index (BMI) 29.0-29.9, adult: Secondary | ICD-10-CM | POA: Diagnosis not present

## 2020-09-27 DIAGNOSIS — N183 Chronic kidney disease, stage 3 unspecified: Secondary | ICD-10-CM

## 2020-09-27 DIAGNOSIS — I493 Ventricular premature depolarization: Secondary | ICD-10-CM | POA: Diagnosis present

## 2020-09-27 DIAGNOSIS — R011 Cardiac murmur, unspecified: Secondary | ICD-10-CM | POA: Diagnosis present

## 2020-09-27 DIAGNOSIS — Z8249 Family history of ischemic heart disease and other diseases of the circulatory system: Secondary | ICD-10-CM

## 2020-09-27 DIAGNOSIS — I5031 Acute diastolic (congestive) heart failure: Secondary | ICD-10-CM | POA: Diagnosis not present

## 2020-09-27 DIAGNOSIS — I2582 Chronic total occlusion of coronary artery: Secondary | ICD-10-CM | POA: Diagnosis present

## 2020-09-27 DIAGNOSIS — E785 Hyperlipidemia, unspecified: Secondary | ICD-10-CM | POA: Diagnosis present

## 2020-09-27 DIAGNOSIS — I701 Atherosclerosis of renal artery: Secondary | ICD-10-CM | POA: Diagnosis present

## 2020-09-27 DIAGNOSIS — I350 Nonrheumatic aortic (valve) stenosis: Secondary | ICD-10-CM

## 2020-09-27 DIAGNOSIS — Z20822 Contact with and (suspected) exposure to covid-19: Secondary | ICD-10-CM | POA: Diagnosis present

## 2020-09-27 DIAGNOSIS — I272 Pulmonary hypertension, unspecified: Secondary | ICD-10-CM | POA: Diagnosis present

## 2020-09-27 DIAGNOSIS — E669 Obesity, unspecified: Secondary | ICD-10-CM | POA: Diagnosis present

## 2020-09-27 DIAGNOSIS — R6881 Early satiety: Secondary | ICD-10-CM | POA: Diagnosis present

## 2020-09-27 DIAGNOSIS — I5033 Acute on chronic diastolic (congestive) heart failure: Secondary | ICD-10-CM | POA: Diagnosis not present

## 2020-09-27 DIAGNOSIS — J9811 Atelectasis: Secondary | ICD-10-CM | POA: Diagnosis not present

## 2020-09-27 DIAGNOSIS — I200 Unstable angina: Secondary | ICD-10-CM | POA: Diagnosis not present

## 2020-09-27 DIAGNOSIS — Z79899 Other long term (current) drug therapy: Secondary | ICD-10-CM | POA: Diagnosis not present

## 2020-09-27 DIAGNOSIS — Z888 Allergy status to other drugs, medicaments and biological substances status: Secondary | ICD-10-CM

## 2020-09-27 DIAGNOSIS — I5030 Unspecified diastolic (congestive) heart failure: Secondary | ICD-10-CM | POA: Diagnosis not present

## 2020-09-27 DIAGNOSIS — Z7982 Long term (current) use of aspirin: Secondary | ICD-10-CM

## 2020-09-27 DIAGNOSIS — Z955 Presence of coronary angioplasty implant and graft: Secondary | ICD-10-CM | POA: Diagnosis not present

## 2020-09-27 DIAGNOSIS — M1990 Unspecified osteoarthritis, unspecified site: Secondary | ICD-10-CM | POA: Diagnosis present

## 2020-09-27 DIAGNOSIS — I5032 Chronic diastolic (congestive) heart failure: Secondary | ICD-10-CM | POA: Diagnosis present

## 2020-09-27 DIAGNOSIS — N1832 Chronic kidney disease, stage 3b: Secondary | ICD-10-CM | POA: Diagnosis not present

## 2020-09-27 DIAGNOSIS — I2510 Atherosclerotic heart disease of native coronary artery without angina pectoris: Secondary | ICD-10-CM | POA: Diagnosis present

## 2020-09-27 DIAGNOSIS — E782 Mixed hyperlipidemia: Secondary | ICD-10-CM | POA: Diagnosis present

## 2020-09-27 DIAGNOSIS — R008 Other abnormalities of heart beat: Secondary | ICD-10-CM | POA: Diagnosis present

## 2020-09-27 DIAGNOSIS — F17290 Nicotine dependence, other tobacco product, uncomplicated: Secondary | ICD-10-CM | POA: Diagnosis present

## 2020-09-27 DIAGNOSIS — R079 Chest pain, unspecified: Secondary | ICD-10-CM | POA: Diagnosis not present

## 2020-09-27 DIAGNOSIS — Z9861 Coronary angioplasty status: Secondary | ICD-10-CM | POA: Diagnosis not present

## 2020-09-27 DIAGNOSIS — I25110 Atherosclerotic heart disease of native coronary artery with unstable angina pectoris: Principal | ICD-10-CM

## 2020-09-27 DIAGNOSIS — R0789 Other chest pain: Secondary | ICD-10-CM | POA: Diagnosis present

## 2020-09-27 DIAGNOSIS — R059 Cough, unspecified: Secondary | ICD-10-CM | POA: Diagnosis not present

## 2020-09-27 DIAGNOSIS — I130 Hypertensive heart and chronic kidney disease with heart failure and stage 1 through stage 4 chronic kidney disease, or unspecified chronic kidney disease: Secondary | ICD-10-CM

## 2020-09-27 DIAGNOSIS — I10 Essential (primary) hypertension: Secondary | ICD-10-CM | POA: Diagnosis not present

## 2020-09-27 DIAGNOSIS — I351 Nonrheumatic aortic (valve) insufficiency: Secondary | ICD-10-CM | POA: Diagnosis present

## 2020-09-27 DIAGNOSIS — R918 Other nonspecific abnormal finding of lung field: Secondary | ICD-10-CM

## 2020-09-27 LAB — BASIC METABOLIC PANEL
Anion gap: 12 (ref 5–15)
BUN: 27 mg/dL — ABNORMAL HIGH (ref 8–23)
CO2: 25 mmol/L (ref 22–32)
Calcium: 8.9 mg/dL (ref 8.9–10.3)
Chloride: 104 mmol/L (ref 98–111)
Creatinine, Ser: 1.65 mg/dL — ABNORMAL HIGH (ref 0.61–1.24)
GFR calc Af Amer: 48 mL/min — ABNORMAL LOW (ref >60)
GFR calc non Af Amer: 41 mL/min — ABNORMAL LOW (ref >60)
Glucose, Bld: 137 mg/dL — ABNORMAL HIGH (ref 70–99)
Potassium: 3.7 mmol/L (ref 3.5–5.1)
Sodium: 141 mmol/L (ref 135–145)

## 2020-09-27 LAB — CBC
HCT: 46.8 % (ref 39.0–52.0)
Hemoglobin: 15 g/dL (ref 13.0–17.0)
MCH: 29.4 pg (ref 26.0–34.0)
MCHC: 32.1 g/dL (ref 30.0–36.0)
MCV: 91.6 fL (ref 80.0–100.0)
Platelets: 181 10*3/uL (ref 150–400)
RBC: 5.11 MIL/uL (ref 4.22–5.81)
RDW: 14.3 % (ref 11.5–15.5)
WBC: 12.6 10*3/uL — ABNORMAL HIGH (ref 4.0–10.5)
nRBC: 0 % (ref 0.0–0.2)

## 2020-09-27 LAB — MAGNESIUM: Magnesium: 2.1 mg/dL (ref 1.7–2.4)

## 2020-09-27 LAB — TROPONIN I (HIGH SENSITIVITY)
Troponin I (High Sensitivity): 25 ng/L — ABNORMAL HIGH (ref <18)
Troponin I (High Sensitivity): 21 ng/L — ABNORMAL HIGH (ref <18)

## 2020-09-27 LAB — HIV ANTIBODY (ROUTINE TESTING W REFLEX): HIV Screen 4th Generation wRfx: NONREACTIVE

## 2020-09-27 LAB — RESPIRATORY PANEL BY RT PCR (FLU A&B, COVID)
Influenza A by PCR: NEGATIVE
Influenza B by PCR: NEGATIVE
SARS Coronavirus 2 by RT PCR: NEGATIVE

## 2020-09-27 LAB — BRAIN NATRIURETIC PEPTIDE: B Natriuretic Peptide: 605.4 pg/mL — ABNORMAL HIGH (ref 0.0–100.0)

## 2020-09-27 MED ORDER — TAMSULOSIN HCL 0.4 MG PO CAPS
0.4000 mg | ORAL_CAPSULE | Freq: Every day | ORAL | Status: DC
  Administered 2020-09-27 – 2020-10-01 (×5): 0.4 mg via ORAL
  Filled 2020-09-27 (×5): qty 1

## 2020-09-27 MED ORDER — FUROSEMIDE 20 MG PO TABS: 20.0000 mg | ORAL_TABLET | Freq: Every day | ORAL | 0 refills | Status: DC

## 2020-09-27 MED ORDER — POTASSIUM CHLORIDE CRYS ER 20 MEQ PO TBCR
20.0000 meq | EXTENDED_RELEASE_TABLET | Freq: Two times a day (BID) | ORAL | Status: DC
  Administered 2020-09-27 – 2020-09-28 (×3): 20 meq via ORAL
  Filled 2020-09-27 (×4): qty 1

## 2020-09-27 MED ORDER — ASPIRIN EC 81 MG PO TBEC
81.0000 mg | DELAYED_RELEASE_TABLET | Freq: Every day | ORAL | Status: DC
  Administered 2020-09-27 – 2020-10-01 (×5): 81 mg via ORAL
  Filled 2020-09-27 (×3): qty 1

## 2020-09-27 MED ORDER — NITROGLYCERIN 0.4 MG SL SUBL: 0.4000 mg | SUBLINGUAL_TABLET | SUBLINGUAL | Status: DC | PRN

## 2020-09-27 MED ORDER — AMLODIPINE BESYLATE 5 MG PO TABS: 5.0000 mg | ORAL_TABLET | Freq: Every day | ORAL | 0 refills | Status: DC

## 2020-09-27 MED ORDER — AMLODIPINE BESYLATE 5 MG PO TABS
5.0000 mg | ORAL_TABLET | Freq: Once | ORAL | Status: AC
  Administered 2020-09-27: 5 mg via ORAL
  Filled 2020-09-27: qty 1

## 2020-09-27 MED ORDER — FUROSEMIDE 10 MG/ML IJ SOLN
40.0000 mg | Freq: Two times a day (BID) | INTRAMUSCULAR | Status: AC
  Administered 2020-09-27 – 2020-09-30 (×6): 40 mg via INTRAVENOUS
  Filled 2020-09-27 (×7): qty 4

## 2020-09-27 MED ORDER — AMLODIPINE BESYLATE 5 MG PO TABS
5.0000 mg | ORAL_TABLET | Freq: Every day | ORAL | Status: DC
  Administered 2020-09-28: 5 mg via ORAL
  Filled 2020-09-27 (×2): qty 1

## 2020-09-27 MED ORDER — ACETAMINOPHEN 325 MG PO TABS: 650.0000 mg | ORAL_TABLET | ORAL | Status: DC | PRN

## 2020-09-27 MED ORDER — ASPIRIN EC 81 MG PO TBEC
81.0000 mg | DELAYED_RELEASE_TABLET | Freq: Every day | ORAL | Status: DC
  Filled 2020-09-27 (×2): qty 1

## 2020-09-27 MED ORDER — ONDANSETRON HCL 4 MG/2ML IJ SOLN: 4.0000 mg | Freq: Four times a day (QID) | INTRAMUSCULAR | Status: DC | PRN

## 2020-09-27 MED ORDER — HEPARIN SODIUM (PORCINE) 5000 UNIT/ML IJ SOLN
5000.0000 [IU] | Freq: Three times a day (TID) | INTRAMUSCULAR | Status: DC
  Administered 2020-09-27 – 2020-09-29 (×6): 5000 [IU] via SUBCUTANEOUS
  Filled 2020-09-27 (×6): qty 1

## 2020-09-27 MED ORDER — METOPROLOL TARTRATE 25 MG PO TABS
25.0000 mg | ORAL_TABLET | Freq: Two times a day (BID) | ORAL | Status: DC
  Administered 2020-09-27: 25 mg via ORAL
  Filled 2020-09-27 (×2): qty 1

## 2020-09-27 MED ORDER — ROSUVASTATIN CALCIUM 20 MG PO TABS
20.0000 mg | ORAL_TABLET | Freq: Every day | ORAL | Status: DC
  Administered 2020-09-27 – 2020-10-01 (×5): 20 mg via ORAL
  Filled 2020-09-27 (×5): qty 1

## 2020-09-27 NOTE — ED Notes (Signed)
Trop 25

## 2020-09-27 NOTE — Telephone Encounter (Signed)
[name] has already been updated. See 09/26/20 telephone encounter from Dr. Clayton Bibles.

## 2020-09-27 NOTE — Telephone Encounter (Signed)
Patient is currently at [facility] ED. Update sent to [name] through secure chat.

## 2020-09-27 NOTE — Telephone Encounter (Signed)
09/27/20 secure chat received from [name].  I got in touch with the PA at [facility], ED. He looks good to be discharged but his blood pressure was elevated. I asked him to add amlodipine 5 mg once daily and furosemide 20 mg once daily. He will be discharged home. I really want him to get the echocardiogram done this week given his new symptoms. He should also get a follow-up basic metabolic profile later this week.

## 2020-09-27 NOTE — ED Provider Notes (Signed)
[facility] EMERGENCY DEPARTMENT Provider Note   CSN: [id number] Arrival date & time: 09/27/20  3536     History Chief Complaint  Patient presents with  . Chest Pain    Chase Barton is a 69 y.o. male possible history of aortic stenosis, chronic kidney disease, CAD, hyperlipidemia, hypertension who presents for evaluation of chest pain.  Patient was seen at [facility] emergency department yesterday.  He went because he was having leg edema and shortness of breath.  He states that this had started after he had played golf and had to get exercise.  He states he never had shortness of breath or leg edema prior.  At [facility], he was evaluated.  His Trope was 35, his chest x-ray showed pulmonary edema with questionable infiltrate concerning for pneumonia.  Patient was given IV Lasix and discharged home with antibiotics.  He reports that he felt better last night.  Then yesterday evening, he started having some chest tightness again as well as some shortness of breath.  He states that he was laying down flat and still would have some mild shortness of breath.  He states that this morning, he decided that he was still having symptoms and wanted to come to [facility] that he can get an echocardiogram.  He states his cardiology doctor has scheduled him for an echocardiogram in December 2021 but he feels that given his symptoms, he needs it sooner.  He states the leg swelling is improved though he still has some mild swelling around his ankles.  He currently right now feels like the chest pain has improved but states he will occasionally get some tightness.  He has not been sick with any fever but has had some cough that has been persistent over the last few weeks.  He has gotten both Covid vaccines.  He denies any abdominal pain, nausea/vomiting.  The history is provided by the patient.       Past Medical History:  Diagnosis Date  . Aortic stenosis 04/2012   mild-moderate  .  Chronic kidney disease    Stage III  . Coronary artery disease 04/2012   Cardiac cath 04/2012: LAD, 90% proximal, RCA: 100% mid with left to right collaterals, Normal EF, Moderate aortic stenosis (peak -peak gradient of 23 mm Hg), LAD PCI with a DES placment.   . Diverticulitis   . Heart murmur   . Hyperlipidemia   . Hypertension 06/20/2012  . PSVT (paroxysmal supraventricular tachycardia) ([place]) 2018   Status post ablation in March 2018 in [place].  . Substance abuse ([place])    remote  . Tobacco abuse     Patient Active Problem List   Diagnosis Date Noted  . PSVT (paroxysmal supraventricular tachycardia) ([place]) 12/25/2016  . COPD with acute exacerbation ([place]) 11/13/2012  . Contact dermatitis and eczema due to plant 10/14/2012  . Bronchitis with chronic airway obstruction ([place]) 08/07/2012  . Claudication of left lower extremity ([place]) 08/06/2012  . Hypertension 06/20/2012  . Carotid bruit 05/06/2012  . Aortic stenosis   . Hyperlipidemia   . Tobacco abuse   . Chest pain on exertion 05/01/2012  . Arthritis 05/01/2012  . Systolic murmur 14/43/1540  . Fatigue 05/01/2012  . Coronary artery disease 04/24/2012    Past Surgical History:  Procedure Laterality Date  . CARDIAC CATHETERIZATION     x1 stent  . CARDIAC CATHETERIZATION    . CARDIAC CATHETERIZATION    . CARDIAC CATHETERIZATION N/A 01/05/2016   Procedure: Right/Left Heart Cath  and Coronary Angiography;  Surgeon: Wellington Hampshire, MD;  Location: [facility];  Service: Cardiovascular;  Laterality: N/A;  . CARDIAC ELECTROPHYSIOLOGY STUDY AND ABLATION         Family History  Problem Relation Age of Onset  . Heart disease Mother        s/p 4 vessel CABG x 2  . Heart disease Father        s/p CABG  . Cancer Father        metastatic prostate CA  . Cancer Brother 1       Lung    Social History   Tobacco Use  . Smoking status: Current Every Day Smoker    Packs/day: 3.00    Types: Cigars  . Smokeless tobacco:  Current User  . Tobacco comment: uses [employer]  Vaping Use  . Vaping Use: Never used  Substance Use Topics  . Alcohol use: Yes    Alcohol/week: 1.0 standard drink    Types: 1 Glasses of wine per week    Comment: rare  . Drug use: No    Home Medications Prior to Admission medications   Medication Sig Start Date End Date Taking? Authorizing Provider  acetaminophen (TYLENOL) 500 MG tablet Take 1,000 mg by mouth daily.    [provider]  amLODipine (NORVASC) 5 MG tablet Take 1 tablet (5 mg total) by mouth daily. 09/27/20 26-Nov-2020  Volanda Napoleon, PA-C  aspirin EC 81 MG tablet Take 81 mg by mouth daily.    [provider]  furosemide (LASIX) 20 MG tablet Take 1 tablet (20 mg total) by mouth daily. 09/27/20 11-26-20  Volanda Napoleon, PA-C  latanoprost (XALATAN) 0.005 % ophthalmic solution Apply 1 drop to eye daily.    [provider]  lisinopril (ZESTRIL) 20 MG tablet Take 0.5 tablets (10 mg total) by mouth 2 (two) times daily. 12/29/19   Wellington Hampshire, MD  metoprolol tartrate (LOPRESSOR) 25 MG tablet Take 1 tablet (25 mg total) by mouth daily. 12/29/19   Wellington Hampshire, MD  nitroGLYCERIN (NITROSTAT) 0.4 MG SL tablet Place 0.4 mg under the tongue every 5 (five) minutes as needed for chest pain.    [provider]  ranolazine (RANEXA) 1000 MG SR tablet Take 1 tablet (1,000 mg total) by mouth 2 (two) times daily. 05/18/16   Wellington Hampshire, MD  rosuvastatin (CRESTOR) 20 MG tablet Take 1 tablet (20 mg total) by mouth daily. 12/29/19   Wellington Hampshire, MD  tamsulosin (FLOMAX) 0.4 MG CAPS capsule Take 0.4 mg by mouth daily.    [provider]    Allergies    Atorvastatin  Review of Systems   Review of Systems  Constitutional: Negative for fever.  Respiratory: Positive for shortness of breath. Negative for cough.   Cardiovascular: Positive for chest pain and leg swelling.  Gastrointestinal: Negative for abdominal pain, nausea and vomiting.   Genitourinary: Negative for dysuria and hematuria.  Neurological: Negative for headaches.  All other systems reviewed and are negative.   Physical Exam Updated Vital Signs BP (!) 178/59 (BP Location: Left Arm)   Pulse 66   Temp 98 F (36.7 C) (Oral)   Resp 18   Ht 6\' 2"  (1.88 m)   Wt 104.3 kg   SpO2 97%   BMI 29.53 kg/m   Physical Exam Vitals and nursing note reviewed.  Constitutional:      Appearance: Normal appearance. He is well-developed.  HENT:  Head: Normocephalic and atraumatic.  Eyes:     General: Lids are normal.     Conjunctiva/sclera: Conjunctivae normal.     Pupils: Pupils are equal, round, and reactive to light.  Cardiovascular:     Rate and Rhythm: Normal rate and regular rhythm.     Pulses: Normal pulses.     Heart sounds: Murmur heard.  Systolic murmur is present.  No friction rub. No gallop.   Pulmonary:     Effort: Pulmonary effort is normal.     Breath sounds: Normal breath sounds.     Comments: Lungs clear to auscultation bilaterally.  Symmetric chest rise.  No wheezing, rales, rhonchi. Abdominal:     Palpations: Abdomen is soft. Abdomen is not rigid.     Tenderness: There is no abdominal tenderness. There is no guarding.     Comments: Abdomen is soft, non-distended, non-tender. No rigidity, No guarding. No peritoneal signs.  Musculoskeletal:        General: Normal range of motion.     Cervical back: Full passive range of motion without pain.     Comments: 1+ pitting edema noted to the bilateral ankles.  No overlying warmth, erythema.  Skin:    General: Skin is warm and dry.     Capillary Refill: Capillary refill takes less than 2 seconds.  Neurological:     Mental Status: He is alert and oriented to person, place, and time.  Psychiatric:        Speech: Speech normal.     ED Results / Procedures / Treatments   Labs (all labs ordered are listed, but only abnormal results are displayed) Labs Reviewed  BASIC METABOLIC PANEL - Abnormal;  Notable for the following components:      Result Value   Glucose, Bld 137 (*)    BUN 27 (*)    Creatinine, Ser 1.65 (*)    GFR calc non Af Amer 41 (*)    GFR calc Af Amer 48 (*)    All other components within normal limits  CBC - Abnormal; Notable for the following components:   WBC 12.6 (*)    All other components within normal limits  BRAIN NATRIURETIC PEPTIDE - Abnormal; Notable for the following components:   B Natriuretic Peptide 605.4 (*)    All other components within normal limits  TROPONIN I (HIGH SENSITIVITY) - Abnormal; Notable for the following components:   Troponin I (High Sensitivity) 25 (*)    All other components within normal limits  TROPONIN I (HIGH SENSITIVITY) - Abnormal; Notable for the following components:   Troponin I (High Sensitivity) 21 (*)    All other components within normal limits    EKG EKG Interpretation  Date/Time:  Monday September 27 2020 07:16:14 EDT Ventricular Rate:  67 PR Interval:  164 QRS Duration: 92 QT Interval:  388 QTC Calculation: 409 R Axis:   76 Text Interpretation: Sinus rhythm with frequent Premature ventricular complexes in a pattern of bigeminy Possible Left atrial enlargement Nonspecific ST and T wave abnormality Abnormal ECG No old tracing to compare Confirmed by Calvert Cantor [id number]) on 09/27/2020 12:51:22 PM   Radiology DG Chest 2 View  Result Date: 09/27/2020 CLINICAL DATA:  Chest pain EXAM: CHEST - 2 VIEW COMPARISON:  01/01/2012 FINDINGS: Right perihilar infiltrate. No edema, effusion, or pneumothorax. Borderline heart size, likely accentuated by fat pad. Normal aortic contours. Rounded bony density posterior to the sternum on the lateral view is related to the ribs and chronic. IMPRESSION: Right perihilar  infiltrate. Followup PA and lateral chest X-ray is recommended in 3-4 weeks to ensure resolution. Electronically Signed   By: Monte Fantasia M.D.   On: 09/27/2020 07:53    Procedures Procedures (including  critical care time)  Medications Ordered in ED Medications  amLODipine (NORVASC) tablet 5 mg (5 mg Oral Given 09/27/20 1512)    ED Course  I have reviewed the triage vital signs and the nursing notes.  Pertinent labs & imaging results that were available during my care of the patient were reviewed by me and considered in my medical decision making (see chart for details).    MDM Rules/Calculators/A&P                          69 year old male who presents for evaluation of chest tightness, shortness of breath.  Seen at [facility] yesterday for same.  Evidence of pulmonary edema noted on his chest x-ray.  He had diuresis done at [facility] and was discharged home.  Started having some chest tightness and shortness of breath last night and called his cardiology Midge Minium) and was prompted to come to the [facility] ED for further evaluation.  On initial ED arrival, he is afebrile, nontoxic-appearing.  He is hypertensive.  I suspect this is because he has been off of his lisinopril.  He is also very anxious.  I suspect this is contributing.  On exam, no evidence of respiratory distress.  He has 1+ pitting edema noted around the ankles.  Chest x-ray, labs ordered at triage.  BMP shows BUN of 27, creatinine 1.65.  CBC shows slight leukocytosis at 12.6.  Lance Bosch is 25.  Delta trop is 21.   Discussed patient with [name] (Cardiology).  He had rescheduled patient's echocardiogram for 10/12/2020.  If patient is stable and if cardiology evaluates patient and does not feel that he needs admission or emergent echo, patient can be discharged home with plans to follow-up with him in his office either this week or next week.  He will arrange for him to have an earlier echo.  He recommends starting patient on amlodipine 5 mg daily and Lasix 20 mg daily.  Discussed patient with Dr. Marlou Porch (Cardiology). Cards will come see patient in the ED.   Patient signed out to Alecia Lemming, PA-C pending Cardiology evaluation.  If cards agrees patient stable for discharge, he can follow up with [name] tomorrow.    Final Clinical Impression(s) / ED Diagnoses Final diagnoses:  Nonspecific chest pain    Rx / DC Orders ED Discharge Orders         Ordered    amLODipine (NORVASC) 5 MG tablet  Daily,   Status:  Discontinued        09/27/20 1459    amLODipine (NORVASC) 5 MG tablet  Daily        09/27/20 1502    furosemide (LASIX) 20 MG tablet  Daily        09/27/20 1502           Desma Mcgregor 09/27/20 1515    Truddie Hidden, MD 09/28/20 [phone]

## 2020-09-27 NOTE — ED Notes (Signed)
Main lab to add on BNP 

## 2020-09-27 NOTE — Telephone Encounter (Signed)
Can you please get an update about him?

## 2020-09-27 NOTE — Telephone Encounter (Signed)
Patient wants [name] to know he is currently admitted to [facility] and moved from hospital in [facility]

## 2020-09-27 NOTE — Discharge Instructions (Addendum)
Radial Site Care  This sheet gives you information about how to care for yourself after your procedure. Your health care provider may also give you more specific instructions. If you have problems or questions, contact your health care provider. What can I expect after the procedure? After the procedure, it is common to have:  Bruising and tenderness at the catheter insertion area. Follow these instructions at home: Medicines  Take over-the-counter and prescription medicines only as told by your health care provider. Insertion site care  Follow instructions from your health care provider about how to take care of your insertion site. Make sure you: ? Wash your hands with soap and water before you change your bandage (dressing). If soap and water are not available, use hand sanitizer. ? Change your dressing as told by your health care provider. ? Leave stitches (sutures), skin glue, or adhesive strips in place. These skin closures may need to stay in place for 2 weeks or longer. If adhesive strip edges start to loosen and curl up, you may trim the loose edges. Do not remove adhesive strips completely unless your health care provider tells you to do that.  Check your insertion site every day for signs of infection. Check for: ? Redness, swelling, or pain. ? Fluid or blood. ? Pus or a bad smell. ? Warmth.  Do not take baths, swim, or use a hot tub until your health care provider approves.  You may shower 24-48 hours after the procedure, or as directed by your health care provider. ? Remove the dressing and gently wash the site with plain soap and water. ? Pat the area dry with a clean towel. ? Do not rub the site. That could cause bleeding.  Do not apply powder or lotion to the site. Activity   For 24 hours after the procedure, or as directed by your health care provider: ? Do not flex or bend the affected arm. ? Do not push or pull heavy objects with the affected arm. ? Do not  drive yourself home from the hospital or clinic. You may drive 24 hours after the procedure unless your health care provider tells you not to. ? Do not operate machinery or power tools.  Do not lift anything that is heavier than 10 lb (4.5 kg), or the limit that you are told, until your health care provider says that it is safe.  Ask your health care provider when it is okay to: ? Return to work or school. ? Resume usual physical activities or sports. ? Resume sexual activity. General instructions  If the catheter site starts to bleed, raise your arm and put firm pressure on the site. If the bleeding does not stop, get help right away. This is a medical emergency.  If you went home on the same day as your procedure, a responsible adult should be with you for the first 24 hours after you arrive home.  Keep all follow-up visits as told by your health care provider. This is important. Contact a health care provider if:  You have a fever.  You have redness, swelling, or yellow drainage around your insertion site. Get help right away if:  You have unusual pain at the radial site.  The catheter insertion area swells very fast.  The insertion area is bleeding, and the bleeding does not stop when you hold steady pressure on the area.  Your arm or hand becomes pale, cool, tingly, or numb. These symptoms may represent a serious problem   that is an emergency. Do not wait to see if the symptoms will go away. Get medical help right away. Call your local emergency services (911 in the [place] Do not drive yourself to the hospital. Summary  After the procedure, it is common to have bruising and tenderness at the site.  Follow instructions from your health care provider about how to take care of your radial site wound. Check the wound every day for signs of infection.  Do not lift anything that is heavier than 10 lb (4.5 kg), or the limit that you are told, until your health care provider says  that it is safe. This information is not intended to replace advice given to you by your health care provider. Make sure you discuss any questions you have with your health care provider. Document Revised: 01/16/2018 Document Reviewed: 01/16/2018 Elsevier Patient Education  2020 [employer]  

## 2020-09-27 NOTE — ED Triage Notes (Signed)
Pt presents with mid-sternum CP and generalized abd pain x2 days. Seen at [facility] ED yesterday where they removed fluid from his lungs and ankles. Pt reports he's unable to sleep d/t increase coughing

## 2020-09-27 NOTE — H&P (Addendum)
Cardiology Admission History and Physical:   Patient ID: Chase Barton MRN: [id number]; DOB: 14-Jul-1951   Admission date: 09/27/2020  Primary Care Provider: Pcp, No [facility] Cardiologist: [name], MD  Chief Complaint: Chest pressure, shortness of breath, orthopnea and lower extremity edema  Patient Profile:   Chase Barton is a 69 y.o. male with with history of CAD s/p stenting to LAD and CTO of RCA, moderate aortic stenosis, hypertension, PSVT s/p ablation, chronic kidney disease stage III, hyperlipidemia and tobacco smoking presented with multiple complaints.  History of CAD s/p stenting to his LAD in May 2013.  Last right and left cardiac catheterization January 2017 showed two-vessel disease with widely patent proximal LAD stent and chronically occluded RCA with left-to-right collaterals.  Moderate aortic stenosis with mean gradient of 27 mmHg and valve area of 0.95 cm.  Last echocardiogram 09/2019 showed LV function of 60 to 65%, moderate diastolic dysfunction, moderate aortic stenosis.  History of Present Illness:   [name] plays golf routinely.  Recently has noted exertional limitation however able to complete 18-hole game.  After playing golf on Friday he had severe shortness of breath, chest pressure, orthopnea and lower extremity edema.  This was his worst episode.  He has been sleeping on recliner. He went to [facility] emergency room.  Treated with 1 dose of Lasix for CHF and discharged.  He did not noticed any improvement on his symptoms and decided to come to [facility] for further evaluation.  Patient cannot tell if his symptoms similar to prior angina when he had stenting.  He reports early satiety, orthopnea, PND and lower extremity edema.  Reports low-sodium diet.  He was seen by nephrologist 1 month ago and his ACE or ARB was discontinued.  Since then his systolic blood pressure running in 140-150s range.  He has received both dose of Covid  vaccine.  Denies Covid exposure.  Currently his breathing stable.  High-sensitivity troponin 25>>21.  BNP 605.  Serum creatinine 1.65.  Chest x-ray showed right perihilar infiltrate.   Past Medical History:  Diagnosis Date  . Aortic stenosis 04/2012   mild-moderate  . Chronic kidney disease    Stage III  . Coronary artery disease 04/2012   Cardiac cath 04/2012: LAD, 90% proximal, RCA: 100% mid with left to right collaterals, Normal EF, Moderate aortic stenosis (peak -peak gradient of 23 mm Hg), LAD PCI with a DES placment.   . Diverticulitis   . Heart murmur   . Hyperlipidemia   . Hypertension 06/20/2012  . PSVT (paroxysmal supraventricular tachycardia) ([place]) 2018   Status post ablation in March 2018 in [place].  . Substance abuse ([place])    remote  . Tobacco abuse     Past Surgical History:  Procedure Laterality Date  . CARDIAC CATHETERIZATION     x1 stent  . CARDIAC CATHETERIZATION    . CARDIAC CATHETERIZATION    . CARDIAC CATHETERIZATION N/A 01/05/2016   Procedure: Right/Left Heart Cath and Coronary Angiography;  Surgeon: Wellington Hampshire, MD;  Location: [place] CV LAB;  Service: Cardiovascular;  Laterality: N/A;  . CARDIAC ELECTROPHYSIOLOGY STUDY AND ABLATION       Medications Prior to Admission: Prior to Admission medications   Medication Sig Start Date End Date Taking? Authorizing Provider  acetaminophen (TYLENOL) 500 MG tablet Take 1,000 mg by mouth daily.    [provider]  amLODipine (NORVASC) 5 MG tablet Take 1 tablet (5 mg total) by mouth daily. 09/27/20  21-Nov-2020  Volanda Napoleon, PA-C  aspirin EC 81 MG tablet Take 81 mg by mouth daily.    [provider]  furosemide (LASIX) 20 MG tablet Take 1 tablet (20 mg total) by mouth daily. 09/27/20 21-Nov-2020  Volanda Napoleon, PA-C  latanoprost (XALATAN) 0.005 % ophthalmic solution Apply 1 drop to eye daily.    [provider]  lisinopril (ZESTRIL) 20 MG tablet Take 0.5 tablets (10 mg total) by  mouth 2 (two) times daily. 12/29/19   Wellington Hampshire, MD  metoprolol tartrate (LOPRESSOR) 25 MG tablet Take 1 tablet (25 mg total) by mouth daily. 12/29/19   Wellington Hampshire, MD  nitroGLYCERIN (NITROSTAT) 0.4 MG SL tablet Place 0.4 mg under the tongue every 5 (five) minutes as needed for chest pain.    [provider]  ranolazine (RANEXA) 1000 MG SR tablet Take 1 tablet (1,000 mg total) by mouth 2 (two) times daily. 05/18/16   Wellington Hampshire, MD  rosuvastatin (CRESTOR) 20 MG tablet Take 1 tablet (20 mg total) by mouth daily. 12/29/19   Wellington Hampshire, MD  tamsulosin (FLOMAX) 0.4 MG CAPS capsule Take 0.4 mg by mouth daily.    [provider]     Allergies:    Allergies  Allergen Reactions  . Atorvastatin Nausea And Vomiting and Other (See Comments)    Social History:   Social History   Socioeconomic History  . Marital status: Married    Spouse name: Not on file  . Number of children: Not on file  . Years of education: Not on file  . Highest education level: Not on file  Occupational History  . Not on file  Tobacco Use  . Smoking status: Current Every Day Smoker    Packs/day: 3.00    Types: Cigars  . Smokeless tobacco: Current User  . Tobacco comment: uses [employer]  Vaping Use  . Vaping Use: Never used  Substance and Sexual Activity  . Alcohol use: Yes    Alcohol/week: 1.0 standard drink    Types: 1 Glasses of wine per week    Comment: rare  . Drug use: No  . Sexual activity: Not on file  Other Topics Concern  . Not on file  Social History Narrative  . Not on file   Social Determinants of Health   Financial Resource Strain:   . Difficulty of Paying Living Expenses: Not on file  Food Insecurity:   . Worried About Charity fundraiser in the Last Year: Not on file  . Ran Out of Food in the Last Year: Not on file  Transportation Needs:   . Lack of Transportation (Medical): Not on file  . Lack of Transportation (Non-Medical): Not on file  Physical  Activity:   . Days of Exercise per Week: Not on file  . Minutes of Exercise per Session: Not on file  Stress:   . Feeling of Stress : Not on file  Social Connections:   . Frequency of Communication with Friends and Family: Not on file  . Frequency of Social Gatherings with Friends and Family: Not on file  . Attends Religious Services: Not on file  . Active Member of Clubs or Organizations: Not on file  . Attends Archivist Meetings: Not on file  . Marital Status: Not on file  Intimate Partner Violence:   . Fear of Current or Ex-Partner: Not on file  . Emotionally Abused: Not on file  . Physically Abused: Not on  file  . Sexually Abused: Not on file    Family History:   The patient's family history includes Cancer in his father; Cancer (age of onset: 25) in his brother; Heart disease in his father and mother.    ROS:  Please see the history of present illness.  All other ROS reviewed and negative.     Physical Exam/Data:   Vitals:   09/27/20 0945 09/27/20 1050 09/27/20 1218 09/27/20 1515  BP: 119/63 (!) 170/60 (!) 178/59 (!) 159/64  Pulse: 66 68 66 67  Resp: 18 16 18 18   Temp: 98 F (36.7 C) 98.4 F (36.9 C) 98 F (36.7 C)   TempSrc: Oral Oral Oral   SpO2: 94% 96% 97% 95%  Weight:      Height:       No intake or output data in the 24 hours ending 09/27/20 1609 Last 3 Weights 09/27/2020 10/02/2019 02/20/2019  Weight (lbs) 230 lb 226 lb 233 lb  Weight (kg) 104.327 kg 102.513 kg 105.688 kg     Body mass index is 29.53 kg/m.  General:  Well nourished, well developed, in no acute distress HEENT: normal Lymph: no adenopathy Neck: + JVD Endocrine:  No thryomegaly Vascular: No carotid bruits; FA pulses 2+ bilaterally without bruits  Cardiac:  normal S1, S2; RRR;systolic mumber Lungs:  clear to auscultation bilaterally, no wheezing, rhonchi or rales  Abd: soft, nontender, no hepatomegaly  Ext: 1+ edema Musculoskeletal:  No deformities, BUE and BLE strength normal  and equal Skin: warm and dry  Neuro:  CNs 2-12 intact, no focal abnormalities noted Psych:  Normal affect    EKG:  The ECG that was done today was personally reviewed and demonstrates sinus rhythm, PVCs, ventricular bigeminy  Relevant CV Studies:  Echo 10/02/2019 1. Left ventricular ejection fraction, by visual estimation, is 60 to  65%. The left ventricle has normal function. Mildly increased left  ventricular size. There is mildly increased left ventricular hypertrophy.  2. Left ventricular diastolic Doppler parameters are consistent with  pseudonormalization pattern of LV diastolic filling.  3. Global right ventricle has normal systolic function.The right  ventricular size is normal. No increase in right ventricular wall  thickness.  4. Left atrial size was moderately dilated.  5. The aortic valve is moderately calcified, unable to determine number  of leaflets. Moderate aortic valve stenosis.  6. Aortic valve area, by VTI measures 1.32 cm.  7. Aortic valve mean gradient measures 15.0 mmHg. (improved gradient  compared to 2019)  8. There is mild dilatation of the ascending aorta measuring 41 mm.  9. TR signal is inadequate for assessing pulmonary artery systolic  pressure.   Right/Left Heart Cath and Coronary Angiography 12/2015  Conclusion   Prox RCA lesion, 70% stenosed.  Mid RCA lesion, 100% stenosed.  Dist Cx lesion, 20% stenosed.   1. Significant 2 vessel coronary artery disease with patent proximal to mid LAD stent with chronically occluded right coronary artery with bridging collaterals and left-to-right collaterals. 2. Right heart catheterization showed no evidence of pulmonary hypertension with normal filling pressures. Aortic stenosis is moderate with a mean gradient of 21 mmHg and valve area of 1.6 cm which is not different from most recent evaluation.  Recommendations: The right coronary artery CTO is not favorable for PCI and has already failed an  antegrade and retrograde attempt at an outside institution. I recommend continuing medical therapy for now.  Diagnostic Dominance: Co-dominant   Laboratory Data:  High Sensitivity Troponin:   Recent  Labs  Lab 09/27/20 0745 09/27/20 1324  TROPONINIHS 25* 21*      Chemistry Recent Labs  Lab 09/27/20 0745  NA 141  K 3.7  CL 104  CO2 25  GLUCOSE 137*  BUN 27*  CREATININE 1.65*  CALCIUM 8.9  GFRNONAA 41*  GFRAA 48*  ANIONGAP 12    Hematology Recent Labs  Lab 09/27/20 0745  WBC 12.6*  RBC 5.11  HGB 15.0  HCT 46.8  MCV 91.6  MCH 29.4  MCHC 32.1  RDW 14.3  PLT 181   BNP Recent Labs  Lab 09/27/20 1334  BNP 605.4*     Radiology/Studies:  DG Chest 2 View  Result Date: 09/27/2020 CLINICAL DATA:  Chest pain EXAM: CHEST - 2 VIEW COMPARISON:  01/01/2012 FINDINGS: Right perihilar infiltrate. No edema, effusion, or pneumothorax. Borderline heart size, likely accentuated by fat pad. Normal aortic contours. Rounded bony density posterior to the sternum on the lateral view is related to the ribs and chronic. IMPRESSION: Right perihilar infiltrate. Followup PA and lateral chest X-ray is recommended in 3-4 weeks to ensure resolution. Electronically Signed   By: Monte Fantasia M.D.   On: 09/27/2020 07:53    HEAR Score (for undifferentiated chest pain):  HEAR Score: 6    New York Heart Association (NYHA) Functional Class NYHA Class II  Assessment and Plan:   1. Chest pressure with history of CAD s/p stenting to LAD and known CTO of RCA -Last cardiac catheterization in 2017 as summarized above.  Patient has recently noted exertional fatigue.  Now chest pressure after playing golf with SOB.  Cannot tell if his symptoms similar to prior angina or not.  EKG with nonspecific changes and ventricular bigeminy.  High-sensitivity troponin minimally up. Seem symptoms due to accelerating angina.  - Continue ASA, statin and BB (increase metoprolol to 25mg  BID then consolidate to long  acting) - Likely left and right cath after diuresis  2.  Acute on chronic diastolic heart failure -Patient has classic heart failure exacerbation symptoms and volume overload by exam.  BNP is elevated.  His symptoms could be due to worsening CAD or valvular disease. -Start IV Lasix and follow renal function closely -Strict INO -Daily weight  3.  Moderate aortic stenosis - Last echo 09/2019 showed aortic valve mean gradient measures 15.0 mmHg and area by VTI measures 1.32 cm.  - Update echo  4. HTN - Recently his SBP running in 140-160s range discontinuation of ACE or ARB by his nephrologist. - SBP of 190/70 here on arrival >> given amlodipine 5mg  qd  5. CKD III - Seems SCr at baseline around 1.6  6. HLD - No results found for requested labs within last 8760 hours.  - Check lipid panel - continue statin   7. PVCs/bigeminy - Increase BB as above - Keep K above 4 - Check Mg  Severity of Illness: The appropriate patient status for this patient is INPATIENT. Inpatient status is judged to be reasonable and necessary in order to provide the required intensity of service to ensure the patient's safety. The patient's presenting symptoms, physical exam findings, and initial radiographic and laboratory data in the context of their chronic comorbidities is felt to place them at high risk for further clinical deterioration. Furthermore, it is not anticipated that the patient will be medically stable for discharge from the hospital within 2 midnights of admission. The following factors support the patient status of inpatient.   " The patient's presenting symptoms include Chest pressure, SOB,  orthopnea, PND. " The worrisome physical exam findings include Volume overload " The initial radiographic and laboratory data are worrisome because of Elevated BNP. " The chronic co-morbidities include CAD, HTN   * I certify that at the point of admission it is my clinical judgment that the patient will  require inpatient hospital care spanning beyond 2 midnights from the point of admission due to high intensity of service, high risk for further deterioration and high frequency of surveillance required.*    For questions or updates, please contact [place] Please consult [email or web address] for contact info under    Jarrett Soho, [place]  09/27/2020 4:09 PM    ATTENDING ATTESTATION  I have seen, examined and evaluated the patient this PM along with Mr. Curly [place], [place].  After reviewing all the available data and chart, we discussed the patients laboratory, study & physical findings as well as symptoms in detail. I agree with his findings, examination as well as impression recommendations as per our discussion.   69 year old gentleman with the least two-vessel disease as noted-known occluded RCA with LAD PCI back in 2013 along with moderate aortic stenosis (really mild to moderate based on gradient) and normal EF likely diastolic dysfunction (HFpEF), who presents with signs symptoms concerning for acute on chronic diastolic heart failure with possible progressive angina.  Is difficult to tell what is driving force.  But possibly it is because his antihypertensive agent was stopped by his nephrologist for concerns of worsening renal function, and his blood pressures have gone higher leading to exacerbation of his diastolic heart failure, however cannot exclude an ischemic component.  Plan for now will be to titrate antihypertensives and diuresis overnight, reevaluate renal function.  If his renal function is stable we will plan for cardiac catheterization on Wednesday with [name].   Otherwise agree the plan as noted above per our discussion.    Glenetta Hew, M.D., M.S. Interventional Cardiologist   Pager # [phone] Phone # [phone] [address] [place], [place] [address]

## 2020-09-28 ENCOUNTER — Inpatient Hospital Stay (HOSPITAL_COMMUNITY): Payer: No Typology Code available for payment source

## 2020-09-28 ENCOUNTER — Inpatient Hospital Stay: Payer: Medicare Other

## 2020-09-28 DIAGNOSIS — I10 Essential (primary) hypertension: Secondary | ICD-10-CM | POA: Diagnosis not present

## 2020-09-28 DIAGNOSIS — I350 Nonrheumatic aortic (valve) stenosis: Secondary | ICD-10-CM

## 2020-09-28 DIAGNOSIS — N1832 Chronic kidney disease, stage 3b: Secondary | ICD-10-CM | POA: Diagnosis not present

## 2020-09-28 DIAGNOSIS — I5033 Acute on chronic diastolic (congestive) heart failure: Secondary | ICD-10-CM | POA: Diagnosis not present

## 2020-09-28 DIAGNOSIS — E785 Hyperlipidemia, unspecified: Secondary | ICD-10-CM

## 2020-09-28 DIAGNOSIS — I200 Unstable angina: Secondary | ICD-10-CM | POA: Diagnosis not present

## 2020-09-28 LAB — LIPID PANEL
Cholesterol: 113 mg/dL (ref 0–200)
HDL: 53 mg/dL (ref >40)
LDL Cholesterol: 44 mg/dL (ref 0–99)
Total CHOL/HDL Ratio: 2.1 RATIO
Triglycerides: 78 mg/dL (ref <150)
VLDL: 16 mg/dL (ref 0–40)

## 2020-09-28 LAB — ECHOCARDIOGRAM COMPLETE
AR max vel: 0.95 cm2
AV Area VTI: 1 cm2
AV Area mean vel: 0.95 cm2
AV Mean grad: 14.5 mmHg
AV Peak grad: 24.4 mmHg
Ao pk vel: 2.47 m/s
Calc EF: 59.2 %
Height: 74 in
S' Lateral: 4.2 cm
Single Plane A2C EF: 59 %
Single Plane A4C EF: 54.6 %
Weight: 3680 oz

## 2020-09-28 LAB — BASIC METABOLIC PANEL
Anion gap: 11 (ref 5–15)
BUN: 27 mg/dL — ABNORMAL HIGH (ref 8–23)
CO2: 30 mmol/L (ref 22–32)
Calcium: 8.6 mg/dL — ABNORMAL LOW (ref 8.9–10.3)
Chloride: 103 mmol/L (ref 98–111)
Creatinine, Ser: 1.51 mg/dL — ABNORMAL HIGH (ref 0.61–1.24)
GFR calc Af Amer: 53 mL/min — ABNORMAL LOW (ref >60)
GFR calc non Af Amer: 46 mL/min — ABNORMAL LOW (ref >60)
Glucose, Bld: 128 mg/dL — ABNORMAL HIGH (ref 70–99)
Potassium: 3.5 mmol/L (ref 3.5–5.1)
Sodium: 144 mmol/L (ref 135–145)

## 2020-09-28 LAB — CBC
HCT: 43.4 % (ref 39.0–52.0)
Hemoglobin: 13.8 g/dL (ref 13.0–17.0)
MCH: 29.3 pg (ref 26.0–34.0)
MCHC: 31.8 g/dL (ref 30.0–36.0)
MCV: 92.1 fL (ref 80.0–100.0)
Platelets: 141 10*3/uL — ABNORMAL LOW (ref 150–400)
RBC: 4.71 MIL/uL (ref 4.22–5.81)
RDW: 14.2 % (ref 11.5–15.5)
WBC: 9.3 10*3/uL (ref 4.0–10.5)
nRBC: 0 % (ref 0.0–0.2)

## 2020-09-28 LAB — HEMOGLOBIN A1C
Hgb A1c MFr Bld: 5.6 % (ref 4.8–5.6)
Mean Plasma Glucose: 114.02 mg/dL

## 2020-09-28 LAB — TSH: TSH: 1.057 u[IU]/mL (ref 0.350–4.500)

## 2020-09-28 MED ORDER — SODIUM CHLORIDE 0.9% FLUSH: 3.0000 mL | INTRAVENOUS | Status: DC | PRN

## 2020-09-28 MED ORDER — TRAMADOL HCL 50 MG PO TABS
50.0000 mg | ORAL_TABLET | Freq: Four times a day (QID) | ORAL | Status: DC | PRN
  Administered 2020-09-28 – 2020-09-29 (×2): 50 mg via ORAL
  Filled 2020-09-28 (×2): qty 1

## 2020-09-28 MED ORDER — TRAMADOL HCL 50 MG PO TABS: 25.0000 mg | ORAL_TABLET | Freq: Four times a day (QID) | ORAL | Status: DC | PRN

## 2020-09-28 MED ORDER — CARVEDILOL 6.25 MG PO TABS
6.2500 mg | ORAL_TABLET | Freq: Two times a day (BID) | ORAL | Status: DC
  Administered 2020-09-28 – 2020-09-29 (×3): 6.25 mg via ORAL
  Filled 2020-09-28 (×3): qty 1

## 2020-09-28 MED ORDER — SODIUM CHLORIDE 0.9% FLUSH
3.0000 mL | Freq: Two times a day (BID) | INTRAVENOUS | Status: DC
  Administered 2020-09-28 – 2020-09-29 (×2): 3 mL via INTRAVENOUS

## 2020-09-28 MED ORDER — SODIUM CHLORIDE 0.9 % IV SOLN
250.0000 mL | INTRAVENOUS | Status: DC | PRN
  Administered 2020-09-29: 250 mL via INTRAVENOUS

## 2020-09-28 MED ORDER — SODIUM CHLORIDE 0.9 % IV SOLN: INTRAVENOUS | Status: DC

## 2020-09-28 NOTE — ED Notes (Signed)
Requested transport for pt

## 2020-09-28 NOTE — Progress Notes (Addendum)
Progress Note  Patient Name: Manish Ruggiero Date of Encounter: 09/28/2020  [facility] Cardiologist: [name], MD   Subjective   No acute overnight events. Patient notes he is feeling better today. Responding well to the IV Lasix. Breathing has improved. Able to lay flat last night for several hours which is an improvement. He does very minimal chest discomfort this morning. He is requesting home prescription of Tramadol for this and arthritis. No palpitations.  Inpatient Medications    Scheduled Meds:  amLODipine  5 mg Oral Daily   aspirin EC  81 mg Oral Daily   aspirin EC  81 mg Oral Daily   carvedilol  6.25 mg Oral BID WC   furosemide  40 mg Intravenous BID   heparin  5,000 Units Subcutaneous Q8H   potassium chloride  20 mEq Oral BID   rosuvastatin  20 mg Oral Daily   tamsulosin  0.4 mg Oral Daily   Continuous Infusions:  PRN Meds: acetaminophen, nitroGLYCERIN, nitroGLYCERIN, ondansetron (ZOFRAN) IV   Vital Signs    Vitals:   09/27/20 2245 09/28/20 0200 09/28/20 0500 09/28/20 0515  BP: (!) 155/44 (!) 143/72 (!) 162/68   Pulse:    70  Resp: 18 16  17   Temp:      TempSrc:      SpO2: 91% 95%  95%  Weight:      Height:       No intake or output data in the 24 hours ending 09/28/20 0831 Last 3 Weights 09/27/2020 10/02/2019 02/20/2019  Weight (lbs) 230 lb 226 lb 233 lb  Weight (kg) 104.327 kg 102.513 kg 105.688 kg      Telemetry    Sinus rhythm with baseline rates in the 50's to 70's. Frequent bigeminy PVCs. 6 beat run of non-sustained VT. - Personally Reviewed  ECG    No new ECG tracing today. - Personally Reviewed  Physical Exam   GEN: No acute distress.   Neck: Supple. Cardiac: Irregular rhythm with normal rate. III/VI systolic murmurs. No rubs or gallops. Radial pulses 2+ and equal bilaterally. Respiratory: Clear to auscultation bilaterally. No wheezes, rhonchi, or rales. GI: Soft, non-tender, non-distended. Bowel sounds  present. MS: No lower extremity edema. No deformity. Skin: Warm and dry. Neuro:  No focal deficits. Psych: Normal affect. Responds appropriately.  Labs    High Sensitivity Troponin:   Recent Labs  Lab 09/27/20 0745 09/27/20 1324  TROPONINIHS 25* 21*      Chemistry Recent Labs  Lab 09/27/20 0745 09/28/20 0521  NA 141 144  K 3.7 3.5  CL 104 103  CO2 25 30  GLUCOSE 137* 128*  BUN 27* 27*  CREATININE 1.65* 1.51*  CALCIUM 8.9 8.6*  GFRNONAA 41* 46*  GFRAA 48* 53*  ANIONGAP 12 11     Hematology Recent Labs  Lab 09/27/20 0745 09/28/20 0521  WBC 12.6* 9.3  RBC 5.11 4.71  HGB 15.0 13.8  HCT 46.8 43.4  MCV 91.6 92.1  MCH 29.4 29.3  MCHC 32.1 31.8  RDW 14.3 14.2  PLT 181 141*    BNP Recent Labs  Lab 09/27/20 1334  BNP 605.4*     DDimer No results for input(s): DDIMER in the last 168 hours.   Radiology    DG Chest 2 View  Result Date: 09/27/2020 CLINICAL DATA:  Chest pain EXAM: CHEST - 2 VIEW COMPARISON:  01/01/2012 FINDINGS: Right perihilar infiltrate. No edema, effusion, or pneumothorax. Borderline heart size, likely accentuated by fat pad. Normal  aortic contours. Rounded bony density posterior to the sternum on the lateral view is related to the ribs and chronic. IMPRESSION: Right perihilar infiltrate. Followup PA and lateral chest X-ray is recommended in 3-4 weeks to ensure resolution. Electronically Signed   By: Monte Fantasia M.D.   On: 09/27/2020 07:53    Cardiac Studies   Echo pending at time of initial evaluation--formal read done shortly after being seen: EF 55 to 60%.  No R WMA.  GRII DD.  Normal PA pressures.  Severe RA dilation.  Mild aortic stenosis with mean gradient 14.5 mmHg.  Dilated IVC with suggestion of elevated RAP.  Patient Profile     69 y.o. male with a history of CAD with known CTO of RCA and prior stenting to LAD in 04/2012, moderate aortic stenosis, paroxysmal SVT s/p ablation, hypertension, hyperlipidemia, CKD stage III, and  tobacco abuse who was admitted with chest pain and acute on chronic diastolic CHF.  Assessment & Plan    Chest Pain with Known CAD - History of CAD. Last cath in 2017 showed patent stent in LAD and chronically occluded RCA with collaterals. - High-sensitivity troponin minimally elevated and flat at 25 >> 21. Not consistent with ACS. - EKG showed normal sinus rhythm with bigeminy PVCs but no acute ischemia.  - Echo pending.  - Reports very minimal pain 1/10 chest discomfort this morning. - Continue aspirin, beta-blocker, and statin. - Plan is for LHC (possibly right as well) tomorrow. The patient understands that risks include but are not limited to stroke (1 in 1000), death (1 in 21), kidney failure [usually temporary] (1 in 500), bleeding (1 in 200), allergic reaction [possibly serious] (1 in 200), and agrees to proceed.    Acute on Chronic Diastolic CHF - BNP elevated at 605.  - Chest x-ray showed right perihilar infiltrate but no edema. Will repeat 2V chest x-ray tomorrow.  - Echo pending. - Currently on IV Lasix 40mg  twice daily. No documented I/O's and no updated weight. Renal function improving with diuresis. - Appears euvolemic on exam. - Continue current dose of IV Lasix. - Lopressor switched to Coreg this morning for better blood pressure control. - Monitor daily weight, strict I/O's, and renal function.  Frequent PVCs - Continue to have bigeminy PVCs.  - Magnesium 2.1 yesterday. - Potassium 3.5 today. Will give dose of K-Dur. - TSH normal. - Switched to Coreg 6.25mg  twice daily today.  Lung Infiltrate - Chest -xray showed right perihilar infiltrate.  - He was prescribed started on Levaquin 750mg  twice daily x7 days at [facility] ED for possible infectious etiology. Patient has not started this yet and this was not started on admission.  - WBC minimally elevated at 12.6 on admission but normal today. Afebrile. - Will repeat 2 view chest x-ray tomorrow.  Hypertension -  BP elevated this morning. - Lopressor was switched to Coreg 6.25mg  twice daily and Amlodipine increased to 10mg  daily this morning.  Hyperlipidemia - Lipid panel this admission: Total Cholesterol 113, Triglycerides 78, HDL 53, LDL 44. - LDL goal <70 given CAD. - Continue home Crestor 20mg  daily.  CKD Stage III - Creatinine 1.51 today, down from 1.65 yesterday. Baseline around 1.6. - Continue to monitor with diuresis.  Arthritis - Patient state he is on Tramadol at home for arthritis and is requesting this for arthritic pain and mild chest pain.  - Reviewed [place] PMP database - he was prescribed Tramadol 50mg  in 05/2020 with no refills. He states he is still  taking this but only states 1/2 of this. - Discussed with Dr. Ellyn Hack - he is OK continuing Tramadol.  For questions or updates, please contact [place] Please consult [email or web address] for contact info under        Signed, Darreld Mclean, PA-C  09/28/2020, 8:31 AM    ATTENDING ATTESTATION  I have seen, examined and evaluated the patient this AM along with Sande Rives, PA.  After reviewing all the available data and chart, we discussed the patients laboratory, study & physical findings as well as symptoms in detail. I agree with her findings, examination as well as impression recommendations as per our discussion.    Attending adjustments noted in italics.   Feeling much better after diuresis.  Plan will be for confirmatory cardiac cath tomorrow.  If LVEDP is normal, may not need right heart cath.    Glenetta Hew, M.D., M.S. Interventional Cardiologist   Pager # [phone] Phone # [phone] [address] [place], [place] [address]

## 2020-09-28 NOTE — Progress Notes (Signed)
  Echocardiogram 2D Echocardiogram has been performed.  Geoffery Lyons [place] 09/28/2020, 10:50 AM

## 2020-09-28 NOTE — Telephone Encounter (Addendum)
Patient is still currently admitted at [facility]. Patients echo has been performed this morning and he is scheduled for a R&L heart cath on 09/29/20 with [name].

## 2020-09-29 ENCOUNTER — Other Ambulatory Visit: Payer: Self-pay

## 2020-09-29 ENCOUNTER — Encounter (HOSPITAL_COMMUNITY)
Admission: EM | Disposition: A | Discharge status: Home / Self Care | Payer: Self-pay | Source: Home / Self Care | Attending: Cardiology

## 2020-09-29 ENCOUNTER — Inpatient Hospital Stay (HOSPITAL_COMMUNITY): Payer: No Typology Code available for payment source

## 2020-09-29 DIAGNOSIS — I5031 Acute diastolic (congestive) heart failure: Secondary | ICD-10-CM | POA: Diagnosis not present

## 2020-09-29 DIAGNOSIS — N1832 Chronic kidney disease, stage 3b: Secondary | ICD-10-CM | POA: Diagnosis not present

## 2020-09-29 DIAGNOSIS — I10 Essential (primary) hypertension: Secondary | ICD-10-CM | POA: Diagnosis not present

## 2020-09-29 DIAGNOSIS — I5033 Acute on chronic diastolic (congestive) heart failure: Secondary | ICD-10-CM | POA: Diagnosis not present

## 2020-09-29 DIAGNOSIS — I25110 Atherosclerotic heart disease of native coronary artery with unstable angina pectoris: Secondary | ICD-10-CM | POA: Diagnosis not present

## 2020-09-29 DIAGNOSIS — I5032 Chronic diastolic (congestive) heart failure: Secondary | ICD-10-CM | POA: Diagnosis present

## 2020-09-29 DIAGNOSIS — I200 Unstable angina: Secondary | ICD-10-CM | POA: Diagnosis not present

## 2020-09-29 HISTORY — PX: CORONARY STENT INTERVENTION: CATH118234

## 2020-09-29 HISTORY — PX: RIGHT/LEFT HEART CATH AND CORONARY ANGIOGRAPHY: CATH118266

## 2020-09-29 HISTORY — PX: INTRAVASCULAR ULTRASOUND/IVUS: CATH118244

## 2020-09-29 LAB — POCT I-STAT EG7
Acid-Base Excess: 10 mmol/L — ABNORMAL HIGH (ref 0.0–2.0)
Bicarbonate: 35.7 mmol/L — ABNORMAL HIGH (ref 20.0–28.0)
Calcium, Ion: 1.13 mmol/L — ABNORMAL LOW (ref 1.15–1.40)
HCT: 42 % (ref 39.0–52.0)
Hemoglobin: 14.3 g/dL (ref 13.0–17.0)
O2 Saturation: 73 %
Potassium: 3.5 mmol/L (ref 3.5–5.1)
Sodium: 145 mmol/L (ref 135–145)
TCO2: 37 mmol/L — ABNORMAL HIGH (ref 22–32)
pCO2, Ven: 52.9 mmHg (ref 44.0–60.0)
pH, Ven: 7.437 — ABNORMAL HIGH (ref 7.250–7.430)
pO2, Ven: 39 mmHg (ref 32.0–45.0)

## 2020-09-29 LAB — POCT I-STAT 7, (LYTES, BLD GAS, ICA,H+H)
Acid-Base Excess: 9 mmol/L — ABNORMAL HIGH (ref 0.0–2.0)
Bicarbonate: 34.1 mmol/L — ABNORMAL HIGH (ref 20.0–28.0)
Calcium, Ion: 1.08 mmol/L — ABNORMAL LOW (ref 1.15–1.40)
HCT: 42 % (ref 39.0–52.0)
Hemoglobin: 14.3 g/dL (ref 13.0–17.0)
O2 Saturation: 97 %
Potassium: 3.4 mmol/L — ABNORMAL LOW (ref 3.5–5.1)
Sodium: 145 mmol/L (ref 135–145)
TCO2: 36 mmol/L — ABNORMAL HIGH (ref 22–32)
pCO2 arterial: 47.5 mmHg (ref 32.0–48.0)
pH, Arterial: 7.464 — ABNORMAL HIGH (ref 7.350–7.450)
pO2, Arterial: 90 mmHg (ref 83.0–108.0)

## 2020-09-29 LAB — POCT ACTIVATED CLOTTING TIME
Activated Clotting Time: 637 seconds
Activated Clotting Time: 351 seconds

## 2020-09-29 LAB — CBC
HCT: 45.5 % (ref 39.0–52.0)
Hemoglobin: 14.9 g/dL (ref 13.0–17.0)
MCH: 29.7 pg (ref 26.0–34.0)
MCHC: 32.7 g/dL (ref 30.0–36.0)
MCV: 90.6 fL (ref 80.0–100.0)
Platelets: 171 10*3/uL (ref 150–400)
RBC: 5.02 MIL/uL (ref 4.22–5.81)
RDW: 14.3 % (ref 11.5–15.5)
WBC: 11.2 10*3/uL — ABNORMAL HIGH (ref 4.0–10.5)
nRBC: 0 % (ref 0.0–0.2)

## 2020-09-29 LAB — BASIC METABOLIC PANEL
Anion gap: 12 (ref 5–15)
BUN: 33 mg/dL — ABNORMAL HIGH (ref 8–23)
CO2: 30 mmol/L (ref 22–32)
Calcium: 8.4 mg/dL — ABNORMAL LOW (ref 8.9–10.3)
Chloride: 101 mmol/L (ref 98–111)
Creatinine, Ser: 1.58 mg/dL — ABNORMAL HIGH (ref 0.61–1.24)
GFR calc non Af Amer: 44 mL/min — ABNORMAL LOW (ref >60)
Glucose, Bld: 128 mg/dL — ABNORMAL HIGH (ref 70–99)
Potassium: 3.4 mmol/L — ABNORMAL LOW (ref 3.5–5.1)
Sodium: 143 mmol/L (ref 135–145)

## 2020-09-29 SURGERY — RIGHT/LEFT HEART CATH AND CORONARY ANGIOGRAPHY
Anesthesia: LOCAL

## 2020-09-29 MED ORDER — VERAPAMIL HCL 2.5 MG/ML IV SOLN
INTRAVENOUS | Status: AC
  Filled 2020-09-29: qty 2

## 2020-09-29 MED ORDER — MIDAZOLAM HCL 2 MG/2ML IJ SOLN
INTRAMUSCULAR | Status: AC
  Filled 2020-09-29: qty 2

## 2020-09-29 MED ORDER — HEPARIN (PORCINE) IN NACL 1000-0.9 UT/500ML-% IV SOLN
INTRAVENOUS | Status: DC | PRN
  Administered 2020-09-29: 500 mL

## 2020-09-29 MED ORDER — CLOPIDOGREL BISULFATE 300 MG PO TABS
ORAL_TABLET | ORAL | Status: DC | PRN
  Administered 2020-09-29: 600 mg via ORAL

## 2020-09-29 MED ORDER — LIDOCAINE HCL (PF) 1 % IJ SOLN
INTRAMUSCULAR | Status: AC
  Filled 2020-09-29: qty 30

## 2020-09-29 MED ORDER — FENTANYL CITRATE (PF) 100 MCG/2ML IJ SOLN
INTRAMUSCULAR | Status: AC
  Filled 2020-09-29: qty 2

## 2020-09-29 MED ORDER — IOHEXOL 350 MG/ML SOLN
INTRAVENOUS | Status: DC | PRN
  Administered 2020-09-29: 135 mL

## 2020-09-29 MED ORDER — CLOPIDOGREL BISULFATE 300 MG PO TABS
ORAL_TABLET | ORAL | Status: AC
  Filled 2020-09-29: qty 2

## 2020-09-29 MED ORDER — LIDOCAINE HCL (PF) 1 % IJ SOLN
INTRAMUSCULAR | Status: DC | PRN
  Administered 2020-09-29 (×2): 2 mL

## 2020-09-29 MED ORDER — LABETALOL HCL 5 MG/ML IV SOLN
10.0000 mg | INTRAVENOUS | Status: DC | PRN
  Administered 2020-09-29: 10 mg via INTRAVENOUS
  Filled 2020-09-29: qty 4

## 2020-09-29 MED ORDER — CLOPIDOGREL BISULFATE 75 MG PO TABS
75.0000 mg | ORAL_TABLET | Freq: Every day | ORAL | Status: DC
  Administered 2020-09-30 – 2020-10-01 (×2): 75 mg via ORAL
  Filled 2020-09-29 (×2): qty 1

## 2020-09-29 MED ORDER — HEPARIN (PORCINE) IN NACL 1000-0.9 UT/500ML-% IV SOLN
INTRAVENOUS | Status: AC
  Filled 2020-09-29: qty 1000

## 2020-09-29 MED ORDER — SODIUM CHLORIDE 0.9 % IV SOLN: INTRAVENOUS | Status: AC

## 2020-09-29 MED ORDER — MIDAZOLAM HCL 2 MG/2ML IJ SOLN
INTRAMUSCULAR | Status: DC | PRN
  Administered 2020-09-29: 1 mg via INTRAVENOUS

## 2020-09-29 MED ORDER — POTASSIUM CHLORIDE CRYS ER 20 MEQ PO TBCR: 40.0000 meq | EXTENDED_RELEASE_TABLET | Freq: Every day | ORAL | Status: DC

## 2020-09-29 MED ORDER — SODIUM CHLORIDE 0.9% FLUSH
3.0000 mL | Freq: Two times a day (BID) | INTRAVENOUS | Status: DC
  Administered 2020-09-30 – 2020-10-01 (×3): 3 mL via INTRAVENOUS

## 2020-09-29 MED ORDER — SODIUM CHLORIDE 0.9 % IV SOLN: 250.0000 mL | INTRAVENOUS | Status: DC | PRN

## 2020-09-29 MED ORDER — FENTANYL CITRATE (PF) 100 MCG/2ML IJ SOLN
INTRAMUSCULAR | Status: DC | PRN
Start: 2020-09-29 — End: 2020-09-29
  Administered 2020-09-29: 50 ug via INTRAVENOUS

## 2020-09-29 MED ORDER — HEPARIN SODIUM (PORCINE) 1000 UNIT/ML IJ SOLN
INTRAMUSCULAR | Status: AC
  Filled 2020-09-29: qty 1

## 2020-09-29 MED ORDER — POTASSIUM CHLORIDE CRYS ER 20 MEQ PO TBCR
40.0000 meq | EXTENDED_RELEASE_TABLET | Freq: Two times a day (BID) | ORAL | Status: DC
  Administered 2020-09-29 – 2020-10-01 (×5): 40 meq via ORAL
  Filled 2020-09-29 (×5): qty 2

## 2020-09-29 MED ORDER — VERAPAMIL HCL 2.5 MG/ML IV SOLN
INTRAVENOUS | Status: DC | PRN
  Administered 2020-09-29: 10 mL via INTRA_ARTERIAL

## 2020-09-29 MED ORDER — CARVEDILOL 12.5 MG PO TABS
12.5000 mg | ORAL_TABLET | Freq: Two times a day (BID) | ORAL | Status: DC
  Administered 2020-09-30 – 2020-10-01 (×3): 12.5 mg via ORAL
  Filled 2020-09-29 (×3): qty 1

## 2020-09-29 MED ORDER — HEPARIN SODIUM (PORCINE) 1000 UNIT/ML IJ SOLN
INTRAMUSCULAR | Status: DC | PRN
  Administered 2020-09-29: 6000 [IU] via INTRAVENOUS
  Administered 2020-09-29: 5000 [IU] via INTRAVENOUS

## 2020-09-29 MED ORDER — SODIUM CHLORIDE 0.9% FLUSH: 3.0000 mL | INTRAVENOUS | Status: DC | PRN

## 2020-09-29 MED ORDER — AMLODIPINE BESYLATE 10 MG PO TABS
10.0000 mg | ORAL_TABLET | Freq: Every day | ORAL | Status: DC
  Administered 2020-09-29 – 2020-10-01 (×3): 10 mg via ORAL
  Filled 2020-09-29 (×3): qty 1

## 2020-09-29 SURGICAL SUPPLY — 22 items
BALLN  ~~LOC~~ SAPPHIRE 5.0X8 (BALLOONS) ×1
BALLN ~~LOC~~ SAPPHIRE 5.0X8 (BALLOONS) ×1
BALLOON ~~LOC~~ SAPPHIRE 5.0X8 (BALLOONS) IMPLANT
CATH BALLN WEDGE 5F 110CM (CATHETERS) ×1 IMPLANT
CATH INFINITI 5FR JK (CATHETERS) ×1 IMPLANT
CATH INFINITI JR4 5F (CATHETERS) ×1 IMPLANT
CATH LAUNCHER 6FR EBU3.5 (CATHETERS) ×1 IMPLANT
CATH OPTICROSS HD (CATHETERS) ×1 IMPLANT
DEVICE RAD COMP TR BAND LRG (VASCULAR PRODUCTS) ×1 IMPLANT
GLIDESHEATH SLEND SS 6F .021 (SHEATH) ×1 IMPLANT
GUIDEWIRE INQWIRE 1.5J.035X260 (WIRE) IMPLANT
INQWIRE 1.5J .035X260CM (WIRE) ×2
KIT ENCORE 26 ADVANTAGE (KITS) ×1 IMPLANT
KIT HEART LEFT (KITS) ×2 IMPLANT
PACK CARDIAC CATHETERIZATION (CUSTOM PROCEDURE TRAY) ×2 IMPLANT
SHEATH GLIDE SLENDER 4/5FR (SHEATH) ×1 IMPLANT
SLED PULL BACK IVUS (MISCELLANEOUS) ×1 IMPLANT
STENT RESOLUTE ONYX 4.0X12 (Permanent Stent) ×1 IMPLANT
STENT RESOLUTE ONYX 4.5X12 (Permanent Stent) ×1 IMPLANT
TRANSDUCER W/STOPCOCK (MISCELLANEOUS) ×2 IMPLANT
TUBING CIL FLEX 10 FLL-RA (TUBING) ×2 IMPLANT
WIRE RUNTHROUGH .014X180CM (WIRE) ×1 IMPLANT

## 2020-09-29 NOTE — Plan of Care (Signed)
?  Problem: Coping: ?Goal: Level of anxiety will decrease ?Outcome: Progressing ?  ?Problem: Safety: ?Goal: Ability to remain free from injury will improve ?Outcome: Progressing ?  ?

## 2020-09-29 NOTE — H&P (View-Only) (Signed)
Progress Note  Patient Name: Chase Barton Date of Encounter: 09/29/2020  [facility] Cardiologist: [name], MD   Subjective   Planned for cardiac cath today. Breathing is better but still wheezing.   Inpatient Medications    Scheduled Meds: . amLODipine  5 mg Oral Daily  . aspirin EC  81 mg Oral Daily  . carvedilol  6.25 mg Oral BID WC  . furosemide  40 mg Intravenous BID  . heparin  5,000 Units Subcutaneous Q8H  . potassium chloride  20 mEq Oral BID  . rosuvastatin  20 mg Oral Daily  . sodium chloride flush  3 mL Intravenous Q12H  . tamsulosin  0.4 mg Oral Daily   Continuous Infusions: . sodium chloride 250 mL (09/29/20 0545)  . sodium chloride     PRN Meds: sodium chloride, acetaminophen, nitroGLYCERIN, ondansetron (ZOFRAN) IV, sodium chloride flush, traMADol   Vital Signs    Vitals:   09/28/20 2011 09/29/20 0500 09/29/20 0601 09/29/20 0850  BP: (!) 153/55  (!) 152/67 (!) 162/60  Pulse: 66  66 (!) 32  Resp: 20  16 20   Temp: 98.1 F (36.7 C)  98.2 F (36.8 C) 98.5 F (36.9 C)  TempSrc: Oral  Oral Oral  SpO2: 96%  96% 94%  Weight:  103.7 kg    Height:        Intake/Output Summary (Last 24 hours) at 09/29/2020 0908 Last data filed at 09/29/2020 0700 Gross per 24 hour  Intake 242.58 ml  Output 2170 ml  Net -1927.42 ml   Last 3 Weights 09/29/2020 09/27/2020 10/02/2019  Weight (lbs) 228 lb 9.6 oz 230 lb 226 lb  Weight (kg) 103.692 kg 104.327 kg 102.513 kg      Telemetry    SR, bigeminy - Personally Reviewed  ECG    No new tracing this morning  Physical Exam   Pleasant older WM GEN: No acute distress.   Neck: No JVD Cardiac: RRR, no murmurs, rubs, or gallops.  Respiratory: Mild expiratory wheezing, R>L GI: Soft, nontender, non-distended  MS: No edema; No deformity. Neuro:  Nonfocal  Psych: Normal affect   Labs    High Sensitivity Troponin:   Recent Labs  Lab 09/27/20 0745 09/27/20 1324  TROPONINIHS 25* 21*       Chemistry Recent Labs  Lab 09/27/20 0745 09/28/20 0521 09/29/20 0648  NA 141 144 143  K 3.7 3.5 3.4*  CL 104 103 101  CO2 25 30 30   GLUCOSE 137* 128* 128*  BUN 27* 27* 33*  CREATININE 1.65* 1.51* 1.58*  CALCIUM 8.9 8.6* 8.4*  GFRNONAA 41* 46* 44*  GFRAA 48* 53*  --   ANIONGAP 12 11 12      Hematology Recent Labs  Lab 09/27/20 0745 09/28/20 0521 09/29/20 0648  WBC 12.6* 9.3 11.2*  RBC 5.11 4.71 5.02  HGB 15.0 13.8 14.9  HCT 46.8 43.4 45.5  MCV 91.6 92.1 90.6  MCH 29.4 29.3 29.7  MCHC 32.1 31.8 32.7  RDW 14.3 14.2 14.3  PLT 181 141* 171    BNP Recent Labs  Lab 09/27/20 1334  BNP 605.4*     DDimer No results for input(s): DDIMER in the last 168 hours.   Radiology    DG Chest 2 View  Result Date: 09/29/2020 CLINICAL DATA:  Cough, pulmonary infiltrate EXAM: CHEST - 2 VIEW COMPARISON:  09/27/2020 FINDINGS: Normal heart size and pulmonary vascularity. Prominence of RIGHT hilum. Mediastinal contours otherwise normal. Peribronchial thickening with subsegmental atelectasis at  RIGHT base. Scattered interstitial infiltrates RIGHT perihilar to RIGHT base. Remaining lungs clear. No pleural effusion or pneumothorax. IMPRESSION: Persistent RIGHT perihilar/basilar infiltrate and basilar atelectasis. Electronically Signed   By: Lavonia Dana M.D.   On: 09/29/2020 08:28   ECHOCARDIOGRAM COMPLETE  Result Date: 09/28/2020    ECHOCARDIOGRAM REPORT   Patient Name:   [name] Date of Exam: 09/28/2020 Medical Rec #:  [id number]           Height:       74.0 in Accession #:    [id number]          Weight:       230.0 lb Date of Birth:  Apr 18, 1951            BSA:          2.306 m Patient Age:    7 years            BP:           162/68 mmHg Patient Gender: M                   HR:           71 bpm. Exam Location:  Inpatient Procedure: 2D Echo, Cardiac Doppler and Color Doppler Indications:    Aortic Stenosis 424.1 / 135.0  History:        Patient has prior history of Echocardiogram  examinations, most                 recent 10/02/2019. CAD, Aortic Valve Disease; Risk                 Factors:Hypertension, Dyslipidemia and Current Smoker.  Sonographer:    Vickie Epley RDCS Referring Phys: [id number] [facility] IMPRESSIONS  1. Left ventricular ejection fraction, by estimation, is 55 to 60%. The left ventricle has normal function. The left ventricle has no regional wall motion abnormalities. The left ventricular internal cavity size was moderately dilated. Left ventricular diastolic parameters are consistent with Grade II diastolic dysfunction (pseudonormalization).  2. Right ventricular systolic function is normal. The right ventricular size is normal. There is normal pulmonary artery systolic pressure.  3. Right atrial size was severely dilated.  4. The mitral valve is normal in structure. Trivial mitral valve regurgitation. No evidence of mitral stenosis.  5. The aortic valve is normal in structure. There is mild calcification of the aortic valve. There is mild thickening of the aortic valve. Aortic valve regurgitation is moderate. Mild aortic valve stenosis. Aortic valve area, by VTI measures 1.00 cm. Aortic valve mean gradient measures 14.5 mmHg. Aortic valve Vmax measures 2.47 m/s.  6. The inferior vena cava is dilated in size with <50% respiratory variability, suggesting right atrial pressure of 15 mmHg. FINDINGS  Left Ventricle: Left ventricular ejection fraction, by estimation, is 55 to 60%. The left ventricle has normal function. The left ventricle has no regional wall motion abnormalities. The left ventricular internal cavity size was moderately dilated. There is no left ventricular hypertrophy. Left ventricular diastolic parameters are consistent with Grade II diastolic dysfunction (pseudonormalization). Right Ventricle: The right ventricular size is normal. No increase in right ventricular wall thickness. Right ventricular systolic function is normal. There is normal pulmonary  artery systolic pressure. The tricuspid regurgitant velocity is 1.73 m/s, and  with an assumed right atrial pressure of 15 mmHg, the estimated right ventricular systolic pressure is 60.6 mmHg. Left Atrium: Left atrial size was normal in size. Right Atrium: Right  atrial size was severely dilated. Pericardium: There is no evidence of pericardial effusion. Mitral Valve: The mitral valve is normal in structure. Trivial mitral valve regurgitation. No evidence of mitral valve stenosis. Tricuspid Valve: The tricuspid valve is normal in structure. Tricuspid valve regurgitation is trivial. No evidence of tricuspid stenosis. Aortic Valve: The aortic valve is normal in structure. There is mild calcification of the aortic valve. There is mild thickening of the aortic valve. Aortic valve regurgitation is moderate. Mild aortic stenosis is present. Aortic valve mean gradient measures 14.5 mmHg. Aortic valve peak gradient measures 24.4 mmHg. Aortic valve area, by VTI measures 1.00 cm. Pulmonic Valve: The pulmonic valve was normal in structure. Pulmonic valve regurgitation is not visualized. No evidence of pulmonic stenosis. Aorta: The aortic root is normal in size and structure. Venous: The inferior vena cava is dilated in size with less than 50% respiratory variability, suggesting right atrial pressure of 15 mmHg. IAS/Shunts: No atrial level shunt detected by color flow Doppler.  LEFT VENTRICLE PLAX 2D LVIDd:         6.40 cm LVIDs:         4.20 cm LV PW:         1.00 cm LV IVS:        1.00 cm LVOT diam:     2.00 cm LV SV:         51 LV SV Index:   22 LVOT Area:     3.14 cm  LV Volumes (MOD) LV vol d, MOD A2C: 184.0 ml LV vol d, MOD A4C: 110.0 ml LV vol s, MOD A2C: 75.4 ml LV vol s, MOD A4C: 49.9 ml LV SV MOD A2C:     108.6 ml LV SV MOD A4C:     110.0 ml LV SV MOD BP:      93.6 ml RIGHT VENTRICLE TAPSE (M-mode): 2.2 cm LEFT ATRIUM             Index       RIGHT ATRIUM           Index LA diam:        4.80 cm 2.08 cm/m  RA Area:      30.90 cm LA Vol (A2C):   68.4 ml 29.66 ml/m RA Volume:   113.00 ml 48.99 ml/m LA Vol (A4C):   51.0 ml 22.11 ml/m LA Biplane Vol: 62.9 ml 27.27 ml/m  AORTIC VALVE AV Area (Vmax):    0.95 cm AV Area (Vmean):   0.95 cm AV Area (VTI):     1.00 cm AV Vmax:           247.00 cm/s AV Vmean:          165.500 cm/s AV VTI:            0.512 m AV Peak Grad:      24.4 mmHg AV Mean Grad:      14.5 mmHg LVOT Vmax:         74.40 cm/s LVOT Vmean:        50.300 cm/s LVOT VTI:          0.162 m LVOT/AV VTI ratio: 0.32  AORTA Ao Root diam: 3.30 cm TRICUSPID VALVE TR Peak grad:   12.0 mmHg TR Vmax:        173.00 cm/s  SHUNTS Systemic VTI:  0.16 m Systemic Diam: 2.00 cm Skeet Latch MD Electronically signed by Skeet Latch MD Signature Date/Time: 09/28/2020/11:56:52 AM    Final  Cardiac Studies   Echo: 09/28/20   IMPRESSIONS    1. Left ventricular ejection fraction, by estimation, is 55 to 60%. The  left ventricle has normal function. The left ventricle has no regional  wall motion abnormalities. The left ventricular internal cavity size was  moderately dilated. Left ventricular  diastolic parameters are consistent with Grade II diastolic dysfunction  (pseudonormalization).  2. Right ventricular systolic function is normal. The right ventricular  size is normal. There is normal pulmonary artery systolic pressure.  3. Right atrial size was severely dilated.  4. The mitral valve is normal in structure. Trivial mitral valve  regurgitation. No evidence of mitral stenosis.  5. The aortic valve is normal in structure. There is mild calcification  of the aortic valve. There is mild thickening of the aortic valve. Aortic  valve regurgitation is moderate. Mild aortic valve stenosis. Aortic valve  area, by VTI measures 1.00 cm.  Aortic valve mean gradient measures 14.5 mmHg. Aortic valve Vmax measures  2.47 m/s.  6. The inferior vena cava is dilated in size with <50% respiratory  variability,  suggesting right atrial pressure of 15 mmHg.   Patient Profile     69 y.o. male with a history of CAD with known CTO of RCA and prior stenting to LAD in 04/2012, moderate aortic stenosis, paroxysmal SVT s/p ablation, hypertension, hyperlipidemia, CKD stage III, and tobacco abuse who was admitted with chest pain and acute on chronic diastolic CHF.  Assessment & Plan    1. Chest Pain with Known CAD: History of CAD. Last cath in 2017 showed patent stent in LAD and chronically occluded RCA with collaterals. Flat hsTn with non ACS trend. Echo with normal EF and no rWMA note. -plan for cardiac cath today -Continue aspirin, beta-blocker, and statin.  2. Acute on Chronic Diastolic CHF: BNP elevated at 605 on admission. - Currently on IV Lasix 40mg  twice daily. Net - 2.2L - Continue current dose of IV Lasix, holding am dose with need for cath. - On Coreg 6.25mg  BID - Monitor daily weight, strict I/O's, and renal function.  3. Frequent PVCs with hx of SVT ablation: - Continue to have bigeminy PVCs.  - Magnesium 2.1, K+ 3.4 this morning. Will suppl extra potassium in addition to daily dose - TSH normal.  4. Lung Infiltrate: - Chest -xray showed persistent right perihilar infiltrate. WBC 11.2, but afebrile. No productive cough - He was prescribed started on Levaquin 750mg  twice daily x7 days at [facility] ED for possible infectious etiology. Patient has not started this yet and this was not started on admission. Will hold on starting at this time.  5. Hypertension: blood pressures remain elevated this morning. Will further titrate Norvasc to 10mg  daily  6. Hyperlipidemia: - Lipid panel this admission: Total Cholesterol 113, Triglycerides 78, HDL 53, LDL 44. - Continue home Crestor 20mg  daily.  7. CKD Stage III - Creatinine 1.51>>1.58 today, down from 1.65 yesterday. Baseline around 1.6. - Continue to monitor with diuresis.  For questions or updates, please contact [place] Please consult [email or web address] for contact info under    Signed, Reino Bellis, NP  09/29/2020, 9:08 AM    ATTENDING ATTESTATION  I have seen, examined and evaluated the patient this AM along with Reino Bellis, NP-Cger.  After reviewing all the available data and chart, we discussed the patients laboratory, study & physical findings as well as symptoms in detail. I agree with her findings, examination as well as impression recommendations as per our  discussion.    No further chest pain.  He is pretty upset about having his BP medications stopped by nephrology.  Thinks that this is the main reason for him being here. Breathing is improved, unfortunately blood pressure still high -> I have added as needed hydralazine.  I am reluctant to push beta-blocker too much further because of resting bradycardia, however he still has lots of PVCs.  At this point I think the best course of action is is to hold Lasix today, plan for at least left heart if not left and right heart cath today with [name] to return if this is ACS or simply heart failure from HFpEF. We will then continue to treat blood pressure by titrating her medications.  Increase amlodipine to 10 mg.  We will discuss follow-up plans with [name] following cath.   Glenetta Hew, M.D., M.S. Interventional Cardiologist   Pager # [phone] Phone # [phone] [address] [place], [place] [address]

## 2020-09-29 NOTE — Interval H&P Note (Signed)
Cath Lab Visit (complete for each Cath Lab visit)  Clinical Evaluation Leading to the Procedure:   ACS: Yes.    Non-ACS:  n/a  CHF NYHA class 4       History and Physical Interval Note:  09/29/2020 2:26 PM  Chase Barton  has presented today for surgery, with the diagnosis of unstable angina.  The various methods of treatment have been discussed with the patient and family. After consideration of risks, benefits and other options for treatment, the patient has consented to  Procedure(s): RIGHT/LEFT HEART CATH AND CORONARY ANGIOGRAPHY (N/A) as a surgical intervention.  The patient's history has been reviewed, patient examined, no change in status, stable for surgery.  I have reviewed the patient's chart and labs.  Questions were answered to the patient's satisfaction.     [name]

## 2020-09-29 NOTE — Progress Notes (Addendum)
Progress Note  Patient Name: Chase Barton Date of Encounter: 09/29/2020  [facility] Cardiologist: [name], MD   Subjective   Planned for cardiac cath today. Breathing is better but still wheezing.   Inpatient Medications    Scheduled Meds: . amLODipine  5 mg Oral Daily  . aspirin EC  81 mg Oral Daily  . carvedilol  6.25 mg Oral BID WC  . furosemide  40 mg Intravenous BID  . heparin  5,000 Units Subcutaneous Q8H  . potassium chloride  20 mEq Oral BID  . rosuvastatin  20 mg Oral Daily  . sodium chloride flush  3 mL Intravenous Q12H  . tamsulosin  0.4 mg Oral Daily   Continuous Infusions: . sodium chloride 250 mL (09/29/20 0545)  . sodium chloride     PRN Meds: sodium chloride, acetaminophen, nitroGLYCERIN, ondansetron (ZOFRAN) IV, sodium chloride flush, traMADol   Vital Signs    Vitals:   09/28/20 2011 09/29/20 0500 09/29/20 0601 09/29/20 0850  BP: (!) 153/55  (!) 152/67 (!) 162/60  Pulse: 66  66 (!) 32  Resp: 20  16 20   Temp: 98.1 F (36.7 C)  98.2 F (36.8 C) 98.5 F (36.9 C)  TempSrc: Oral  Oral Oral  SpO2: 96%  96% 94%  Weight:  103.7 kg    Height:        Intake/Output Summary (Last 24 hours) at 09/29/2020 0908 Last data filed at 09/29/2020 0700 Gross per 24 hour  Intake 242.58 ml  Output 2170 ml  Net -1927.42 ml   Last 3 Weights 09/29/2020 09/27/2020 10/02/2019  Weight (lbs) 228 lb 9.6 oz 230 lb 226 lb  Weight (kg) 103.692 kg 104.327 kg 102.513 kg      Telemetry    SR, bigeminy - Personally Reviewed  ECG    No new tracing this morning  Physical Exam   Pleasant older WM GEN: No acute distress.   Neck: No JVD Cardiac: RRR, no murmurs, rubs, or gallops.  Respiratory: Mild expiratory wheezing, R>L GI: Soft, nontender, non-distended  MS: No edema; No deformity. Neuro:  Nonfocal  Psych: Normal affect   Labs    High Sensitivity Troponin:   Recent Labs  Lab 09/27/20 0745 09/27/20 1324  TROPONINIHS 25* 21*       Chemistry Recent Labs  Lab 09/27/20 0745 09/28/20 0521 09/29/20 0648  NA 141 144 143  K 3.7 3.5 3.4*  CL 104 103 101  CO2 25 30 30   GLUCOSE 137* 128* 128*  BUN 27* 27* 33*  CREATININE 1.65* 1.51* 1.58*  CALCIUM 8.9 8.6* 8.4*  GFRNONAA 41* 46* 44*  GFRAA 48* 53*  --   ANIONGAP 12 11 12      Hematology Recent Labs  Lab 09/27/20 0745 09/28/20 0521 09/29/20 0648  WBC 12.6* 9.3 11.2*  RBC 5.11 4.71 5.02  HGB 15.0 13.8 14.9  HCT 46.8 43.4 45.5  MCV 91.6 92.1 90.6  MCH 29.4 29.3 29.7  MCHC 32.1 31.8 32.7  RDW 14.3 14.2 14.3  PLT 181 141* 171    BNP Recent Labs  Lab 09/27/20 1334  BNP 605.4*     DDimer No results for input(s): DDIMER in the last 168 hours.   Radiology    DG Chest 2 View  Result Date: 09/29/2020 CLINICAL DATA:  Cough, pulmonary infiltrate EXAM: CHEST - 2 VIEW COMPARISON:  09/27/2020 FINDINGS: Normal heart size and pulmonary vascularity. Prominence of RIGHT hilum. Mediastinal contours otherwise normal. Peribronchial thickening with subsegmental atelectasis at  RIGHT base. Scattered interstitial infiltrates RIGHT perihilar to RIGHT base. Remaining lungs clear. No pleural effusion or pneumothorax. IMPRESSION: Persistent RIGHT perihilar/basilar infiltrate and basilar atelectasis. Electronically Signed   By: Lavonia Dana M.D.   On: 09/29/2020 08:28   ECHOCARDIOGRAM COMPLETE  Result Date: 09/28/2020    ECHOCARDIOGRAM REPORT   Patient Name:   Chase Barton Date of Exam: 09/28/2020 Medical Rec #:  [id number]           Height:       74.0 in Accession #:    [id number]          Weight:       230.0 lb Date of Birth:  January 19, 1951            BSA:          2.306 m Patient Age:    69 years            BP:           162/68 mmHg Patient Gender: M                   HR:           71 bpm. Exam Location:  Inpatient Procedure: 2D Echo, Cardiac Doppler and Color Doppler Indications:    Aortic Stenosis 424.1 / 135.0  History:        Patient has prior history of Echocardiogram  examinations, most                 recent 10/02/2019. CAD, Aortic Valve Disease; Risk                 Factors:Hypertension, Dyslipidemia and Current Smoker.  Sonographer:    Vickie Epley RDCS Referring Phys: [id number] [facility]AGAT IMPRESSIONS  1. Left ventricular ejection fraction, by estimation, is 55 to 60%. The left ventricle has normal function. The left ventricle has no regional wall motion abnormalities. The left ventricular internal cavity size was moderately dilated. Left ventricular diastolic parameters are consistent with Grade II diastolic dysfunction (pseudonormalization).  2. Right ventricular systolic function is normal. The right ventricular size is normal. There is normal pulmonary artery systolic pressure.  3. Right atrial size was severely dilated.  4. The mitral valve is normal in structure. Trivial mitral valve regurgitation. No evidence of mitral stenosis.  5. The aortic valve is normal in structure. There is mild calcification of the aortic valve. There is mild thickening of the aortic valve. Aortic valve regurgitation is moderate. Mild aortic valve stenosis. Aortic valve area, by VTI measures 1.00 cm. Aortic valve mean gradient measures 14.5 mmHg. Aortic valve Vmax measures 2.47 m/s.  6. The inferior vena cava is dilated in size with <50% respiratory variability, suggesting right atrial pressure of 15 mmHg. FINDINGS  Left Ventricle: Left ventricular ejection fraction, by estimation, is 55 to 60%. The left ventricle has normal function. The left ventricle has no regional wall motion abnormalities. The left ventricular internal cavity size was moderately dilated. There is no left ventricular hypertrophy. Left ventricular diastolic parameters are consistent with Grade II diastolic dysfunction (pseudonormalization). Right Ventricle: The right ventricular size is normal. No increase in right ventricular wall thickness. Right ventricular systolic function is normal. There is normal pulmonary  artery systolic pressure. The tricuspid regurgitant velocity is 1.73 m/s, and  with an assumed right atrial pressure of 15 mmHg, the estimated right ventricular systolic pressure is 15.4 mmHg. Left Atrium: Left atrial size was normal in size. Right Atrium: Right  atrial size was severely dilated. Pericardium: There is no evidence of pericardial effusion. Mitral Valve: The mitral valve is normal in structure. Trivial mitral valve regurgitation. No evidence of mitral valve stenosis. Tricuspid Valve: The tricuspid valve is normal in structure. Tricuspid valve regurgitation is trivial. No evidence of tricuspid stenosis. Aortic Valve: The aortic valve is normal in structure. There is mild calcification of the aortic valve. There is mild thickening of the aortic valve. Aortic valve regurgitation is moderate. Mild aortic stenosis is present. Aortic valve mean gradient measures 14.5 mmHg. Aortic valve peak gradient measures 24.4 mmHg. Aortic valve area, by VTI measures 1.00 cm. Pulmonic Valve: The pulmonic valve was normal in structure. Pulmonic valve regurgitation is not visualized. No evidence of pulmonic stenosis. Aorta: The aortic root is normal in size and structure. Venous: The inferior vena cava is dilated in size with less than 50% respiratory variability, suggesting right atrial pressure of 15 mmHg. IAS/Shunts: No atrial level shunt detected by color flow Doppler.  LEFT VENTRICLE PLAX 2D LVIDd:         6.40 cm LVIDs:         4.20 cm LV PW:         1.00 cm LV IVS:        1.00 cm LVOT diam:     2.00 cm LV SV:         51 LV SV Index:   22 LVOT Area:     3.14 cm  LV Volumes (MOD) LV vol d, MOD A2C: 184.0 ml LV vol d, MOD A4C: 110.0 ml LV vol s, MOD A2C: 75.4 ml LV vol s, MOD A4C: 49.9 ml LV SV MOD A2C:     108.6 ml LV SV MOD A4C:     110.0 ml LV SV MOD BP:      93.6 ml RIGHT VENTRICLE TAPSE (M-mode): 2.2 cm LEFT ATRIUM             Index       RIGHT ATRIUM           Index LA diam:        4.80 cm 2.08 cm/m  RA Area:      30.90 cm LA Vol (A2C):   68.4 ml 29.66 ml/m RA Volume:   113.00 ml 48.99 ml/m LA Vol (A4C):   51.0 ml 22.11 ml/m LA Biplane Vol: 62.9 ml 27.27 ml/m  AORTIC VALVE AV Area (Vmax):    0.95 cm AV Area (Vmean):   0.95 cm AV Area (VTI):     1.00 cm AV Vmax:           247.00 cm/s AV Vmean:          165.500 cm/s AV VTI:            0.512 m AV Peak Grad:      24.4 mmHg AV Mean Grad:      14.5 mmHg LVOT Vmax:         74.40 cm/s LVOT Vmean:        50.300 cm/s LVOT VTI:          0.162 m LVOT/AV VTI ratio: 0.32  AORTA Ao Root diam: 3.30 cm TRICUSPID VALVE TR Peak grad:   12.0 mmHg TR Vmax:        173.00 cm/s  SHUNTS Systemic VTI:  0.16 m Systemic Diam: 2.00 cm Skeet Latch MD Electronically signed by Skeet Latch MD Signature Date/Time: 09/28/2020/11:56:52 AM    Final  Cardiac Studies   Echo: 09/28/20   IMPRESSIONS    1. Left ventricular ejection fraction, by estimation, is 55 to 60%. The  left ventricle has normal function. The left ventricle has no regional  wall motion abnormalities. The left ventricular internal cavity size was  moderately dilated. Left ventricular  diastolic parameters are consistent with Grade II diastolic dysfunction  (pseudonormalization).  2. Right ventricular systolic function is normal. The right ventricular  size is normal. There is normal pulmonary artery systolic pressure.  3. Right atrial size was severely dilated.  4. The mitral valve is normal in structure. Trivial mitral valve  regurgitation. No evidence of mitral stenosis.  5. The aortic valve is normal in structure. There is mild calcification  of the aortic valve. There is mild thickening of the aortic valve. Aortic  valve regurgitation is moderate. Mild aortic valve stenosis. Aortic valve  area, by VTI measures 1.00 cm.  Aortic valve mean gradient measures 14.5 mmHg. Aortic valve Vmax measures  2.47 m/s.  6. The inferior vena cava is dilated in size with <50% respiratory  variability,  suggesting right atrial pressure of 15 mmHg.   Patient Profile     69 y.o. male with a history of CAD with known CTO of RCA and prior stenting to LAD in 04/2012, moderate aortic stenosis, paroxysmal SVT s/p ablation, hypertension, hyperlipidemia, CKD stage III, and tobacco abuse who was admitted with chest pain and acute on chronic diastolic CHF.  Assessment & Plan    1. Chest Pain with Known CAD: History of CAD. Last cath in 2017 showed patent stent in LAD and chronically occluded RCA with collaterals. Flat hsTn with non ACS trend. Echo with normal EF and no rWMA note. -plan for cardiac cath today -Continue aspirin, beta-blocker, and statin.  2. Acute on Chronic Diastolic CHF: BNP elevated at 605 on admission. - Currently on IV Lasix 40mg  twice daily. Net - 2.2L - Continue current dose of IV Lasix, holding am dose with need for cath. - On Coreg 6.25mg  BID - Monitor daily weight, strict I/O's, and renal function.  3. Frequent PVCs with hx of SVT ablation: - Continue to have bigeminy PVCs.  - Magnesium 2.1, K+ 3.4 this morning. Will suppl extra potassium in addition to daily dose - TSH normal.  4. Lung Infiltrate: - Chest -xray showed persistent right perihilar infiltrate. WBC 11.2, but afebrile. No productive cough - He was prescribed started on Levaquin 750mg  twice daily x7 days at [facility] ED for possible infectious etiology. Patient has not started this yet and this was not started on admission. Will hold on starting at this time.  5. Hypertension: blood pressures remain elevated this morning. Will further titrate Norvasc to 10mg  daily  6. Hyperlipidemia: - Lipid panel this admission: Total Cholesterol 113, Triglycerides 78, HDL 53, LDL 44. - Continue home Crestor 20mg  daily.  7. CKD Stage III - Creatinine 1.51>>1.58 today, down from 1.65 yesterday. Baseline around 1.6. - Continue to monitor with diuresis.  For questions or updates, please contact [place] Please consult [email or web address] for contact info under    Signed, Reino Bellis, NP  09/29/2020, 9:08 AM    ATTENDING ATTESTATION  I have seen, examined and evaluated the patient this AM along with Reino Bellis, NP-Cger.  After reviewing all the available data and chart, we discussed the patients laboratory, study & physical findings as well as symptoms in detail. I agree with her findings, examination as well as impression recommendations as per our  discussion.    No further chest pain.  He is pretty upset about having his BP medications stopped by nephrology.  Thinks that this is the main reason for him being here. Breathing is improved, unfortunately blood pressure still high -> I have added as needed hydralazine.  I am reluctant to push beta-blocker too much further because of resting bradycardia, however he still has lots of PVCs.  At this point I think the best course of action is is to hold Lasix today, plan for at least left heart if not left and right heart cath today with [name] to return if this is ACS or simply heart failure from HFpEF. We will then continue to treat blood pressure by titrating her medications.  Increase amlodipine to 10 mg.  We will discuss follow-up plans with [name] following cath.   Glenetta Hew, M.D., M.S. Interventional Cardiologist   Pager # [phone] Phone # [phone] [address] [place], [place] [address]

## 2020-09-29 NOTE — Progress Notes (Signed)
RN notified by CCMD, ST elevation noted. VSS. Pt. Denies pain or discomfort. Currently resting in bed. Cardiology paged to make aware. Darthula Desa, Katherine Roan

## 2020-09-30 ENCOUNTER — Encounter (HOSPITAL_COMMUNITY): Payer: Self-pay | Admitting: Cardiovascular Disease

## 2020-09-30 DIAGNOSIS — I200 Unstable angina: Secondary | ICD-10-CM | POA: Diagnosis not present

## 2020-09-30 DIAGNOSIS — I5033 Acute on chronic diastolic (congestive) heart failure: Secondary | ICD-10-CM | POA: Diagnosis not present

## 2020-09-30 DIAGNOSIS — I350 Nonrheumatic aortic (valve) stenosis: Secondary | ICD-10-CM

## 2020-09-30 DIAGNOSIS — N1832 Chronic kidney disease, stage 3b: Secondary | ICD-10-CM | POA: Diagnosis not present

## 2020-09-30 DIAGNOSIS — I25110 Atherosclerotic heart disease of native coronary artery with unstable angina pectoris: Secondary | ICD-10-CM | POA: Diagnosis not present

## 2020-09-30 DIAGNOSIS — I493 Ventricular premature depolarization: Secondary | ICD-10-CM | POA: Diagnosis present

## 2020-09-30 LAB — CBC
HCT: 43.9 % (ref 39.0–52.0)
Hemoglobin: 14.2 g/dL (ref 13.0–17.0)
MCH: 29.5 pg (ref 26.0–34.0)
MCHC: 32.3 g/dL (ref 30.0–36.0)
MCV: 91.1 fL (ref 80.0–100.0)
Platelets: 162 10*3/uL (ref 150–400)
RBC: 4.82 MIL/uL (ref 4.22–5.81)
RDW: 14.2 % (ref 11.5–15.5)
WBC: 11.2 10*3/uL — ABNORMAL HIGH (ref 4.0–10.5)
nRBC: 0 % (ref 0.0–0.2)

## 2020-09-30 LAB — BASIC METABOLIC PANEL
Anion gap: 14 (ref 5–15)
BUN: 35 mg/dL — ABNORMAL HIGH (ref 8–23)
CO2: 28 mmol/L (ref 22–32)
Calcium: 8.4 mg/dL — ABNORMAL LOW (ref 8.9–10.3)
Chloride: 102 mmol/L (ref 98–111)
Creatinine, Ser: 1.63 mg/dL — ABNORMAL HIGH (ref 0.61–1.24)
GFR calc non Af Amer: 42 mL/min — ABNORMAL LOW (ref >60)
Glucose, Bld: 131 mg/dL — ABNORMAL HIGH (ref 70–99)
Potassium: 3.5 mmol/L (ref 3.5–5.1)
Sodium: 144 mmol/L (ref 135–145)

## 2020-09-30 MED ORDER — POTASSIUM CHLORIDE CRYS ER 20 MEQ PO TBCR
40.0000 meq | EXTENDED_RELEASE_TABLET | Freq: Once | ORAL | Status: AC
  Administered 2020-09-30: 40 meq via ORAL
  Filled 2020-09-30: qty 2

## 2020-09-30 MED ORDER — FUROSEMIDE 40 MG PO TABS: 40.0000 mg | ORAL_TABLET | Freq: Every day | ORAL | Status: DC

## 2020-09-30 NOTE — Progress Notes (Addendum)
Progress Note  Patient Name: Chase Barton Date of Encounter: 09/30/2020  [facility] Cardiologist: [name], MD   Subjective   No acute overnight events. No recurrent chest pain. Breathing is much better. Orthopnea has resolved and able to ambulate with cardiac rehab without any problems. He does report more fluttering in his chest today which is bothersome to him.   Inpatient Medications    Scheduled Meds: . amLODipine  10 mg Oral Daily  . aspirin EC  81 mg Oral Daily  . carvedilol  12.5 mg Oral BID WC  . clopidogrel  75 mg Oral Q breakfast  . furosemide  40 mg Intravenous BID  . potassium chloride  40 mEq Oral BID  . rosuvastatin  20 mg Oral Daily  . sodium chloride flush  3 mL Intravenous Q12H  . tamsulosin  0.4 mg Oral Daily   Continuous Infusions: . sodium chloride     PRN Meds: sodium chloride, acetaminophen, labetalol, nitroGLYCERIN, ondansetron (ZOFRAN) IV, sodium chloride flush, traMADol   Vital Signs    Vitals:   09/29/20 1848 09/29/20 2100 09/30/20 0539 09/30/20 0556  BP: (!) 143/54 (!) 169/63 (!) 164/74   Pulse:  (!) 34 (!) 45   Resp: 20 15 20    Temp:  98 F (36.7 C) 98 F (36.7 C)   TempSrc:  Oral Oral   SpO2: 92% 94% 94%   Weight:    102.6 kg  Height:    6\' 2"  (1.88 m)    Intake/Output Summary (Last 24 hours) at 09/30/2020 0801 Last data filed at 09/30/2020 0557 Gross per 24 hour  Intake 0 ml  Output 1630 ml  Net -1630 ml   Last 3 Weights 09/30/2020 09/29/2020 09/27/2020  Weight (lbs) 226 lb 1.6 oz 228 lb 9.6 oz 230 lb  Weight (kg) 102.558 kg 103.692 kg 104.327 kg      Telemetry    Normal sinus rhythm with baseline rates in the 70's. Still with frequent PVCs often in bigeminy pattern. - Personally Reviewed  ECG    Normal sinus rhythm with T wave in lead III and bigeminy PVCs. - Personally Reviewed  Physical Exam   GEN: No acute distress.   Neck: Supple. Positive hepatojugular reflux. Cardiac: Irregular rhythm with  normal rate. II-III systolic murmur. No rubs or gallops rubs. Right radial cath site soft with no signs of hematoma. Respiratory: Clear to auscultation bilaterally. Initially noted rhonchi in right base but this cleared with coughing. No wheezes or rales. GI: Soft, slightly distended, and non-tender. MS: No lower extremity edema. No deformity. Neuro:  No focal deficits. Psych: Normal affect. Responds appropriately.  Labs    High Sensitivity Troponin:   Recent Labs  Lab 09/27/20 0745 09/27/20 1324  TROPONINIHS 25* 21*      Chemistry Recent Labs  Lab 09/27/20 0745 09/27/20 0745 09/28/20 0521 09/28/20 0521 09/29/20 0648 09/29/20 1448 09/30/20 0440  NA 141   < > 144   < > 143 145  145 144  K 3.7   < > 3.5   < > 3.4* 3.5  3.4* 3.5  CL 104   < > 103  --  101  --  102  CO2 25   < > 30  --  30  --  28  GLUCOSE 137*   < > 128*  --  128*  --  131*  BUN 27*   < > 27*  --  33*  --  35*  CREATININE 1.65*   < >  1.51*  --  1.58*  --  1.63*  CALCIUM 8.9   < > 8.6*  --  8.4*  --  8.4*  GFRNONAA 41*   < > 46*  --  44*  --  42*  GFRAA 48*  --  53*  --   --   --   --   ANIONGAP 12   < > 11  --  12  --  14   < > = values in this interval not displayed.     Hematology Recent Labs  Lab 09/28/20 0521 09/28/20 0521 09/29/20 0648 09/29/20 1448 09/30/20 0440  WBC 9.3  --  11.2*  --  11.2*  RBC 4.71  --  5.02  --  4.82  HGB 13.8   < > 14.9 14.3  14.3 14.2  HCT 43.4   < > 45.5 42.0  42.0 43.9  MCV 92.1  --  90.6  --  91.1  MCH 29.3  --  29.7  --  29.5  MCHC 31.8  --  32.7  --  32.3  RDW 14.2  --  14.3  --  14.2  PLT 141*  --  171  --  162   < > = values in this interval not displayed.    BNP Recent Labs  Lab 09/27/20 1334  BNP 605.4*     DDimer No results for input(s): DDIMER in the last 168 hours.   Radiology    DG Chest 2 View  Result Date: 09/29/2020 CLINICAL DATA:  Cough, pulmonary infiltrate EXAM: CHEST - 2 VIEW COMPARISON:  09/27/2020 FINDINGS: Normal heart size  and pulmonary vascularity. Prominence of RIGHT hilum. Mediastinal contours otherwise normal. Peribronchial thickening with subsegmental atelectasis at RIGHT base. Scattered interstitial infiltrates RIGHT perihilar to RIGHT base. Remaining lungs clear. No pleural effusion or pneumothorax. IMPRESSION: Persistent RIGHT perihilar/basilar infiltrate and basilar atelectasis. Electronically Signed   By: Lavonia Dana M.D.   On: 09/29/2020 08:28   CARDIAC CATHETERIZATION  Addendum Date: 09/29/2020    Dist Cx lesion is 20% stenosed.  Prox RCA lesion is 70% stenosed.  Prox Cx lesion is 80% stenosed.  Post intervention, there is a 10% residual stenosis.  A drug-eluting stent was successfully placed using a STENT RESOLUTE ONYX 4.5X12.  Lat 1st Mrg lesion is 75% stenosed.  Mid Cx lesion is 80% stenosed.  Post intervention, there is a 0% residual stenosis.  A drug-eluting stent was successfully placed using a STENT RESOLUTE ONYX 4.0X12.  Previously placed Mid LAD stent (unknown type) is widely patent.  Mid RCA lesion is 100% stenosed.  1.  Widely patent LAD stent with no significant restenosis, chronically occluded right coronary artery with left-to-right collaterals, new 80% stenosis in proximal and mid left circumflex. 2.  Left ventricular angiography was not performed due to underlying chronic kidney disease. 3.  Right heart catheterization showed mild to moderate elevation in filling pressures, moderate pulmonary hypertension and normal cardiac output. 4.  Mild gradient across aortic valve.  Evaluating the gradient was difficult due to frequent PVCs. 5.  Successful IVUS guided drug-eluting stent placement to mid and proximal left circumflex. Recommendations: Dual antiplatelet therapy for at least 6 months. Aggressive treatment of risk factors and blood pressure control. I increase carvedilol to 12.5 mg twice daily. The patient continues to be volume overloaded and probably requires 1 more day of IV diuresis.   Monitor renal function closely.   Result Date: 09/29/2020  Dist Cx lesion is 20% stenosed.  Prox RCA  lesion is 70% stenosed.  Prox Cx lesion is 80% stenosed.  Post intervention, there is a 10% residual stenosis.  A drug-eluting stent was successfully placed using a STENT RESOLUTE ONYX 4.5X12.  Lat 1st Mrg lesion is 75% stenosed.  Mid Cx lesion is 80% stenosed.  Post intervention, there is a 0% residual stenosis.  A drug-eluting stent was successfully placed using a STENT RESOLUTE ONYX 4.0X12.  Previously placed Mid LAD stent (unknown type) is widely patent.  Mid RCA lesion is 100% stenosed.  1.  Widely patent LAD stent with no significant restenosis, chronically occluded right coronary artery with left-to-right collaterals, new 80% stenosis in proximal and mid left circumflex. 2.  Left ventricular angiography was not performed due to underlying chronic kidney disease. 3.  Right heart catheterization showed mild to moderate elevation in filling pressures, moderate pulmonary hypertension and normal cardiac output. 4.  Successful IVUS guided drug-eluting stent placement to mid and proximal left circumflex. Recommendations: Dual antiplatelet therapy for at least 6 months. Aggressive treatment of risk factors and blood pressure control. I increase carvedilol to 12.5 mg twice daily. The patient continues to be volume overloaded and probably requires 1 more day of IV diuresis.  Monitor renal function closely.   ECHOCARDIOGRAM COMPLETE  Result Date: 09/28/2020    ECHOCARDIOGRAM REPORT   Patient Name:   LAVELL SUPPLE Date of Exam: 09/28/2020 Medical Rec #:  [id number]           Height:       74.0 in Accession #:    [id number]          Weight:       230.0 lb Date of Birth:  [birth year]            BSA:          2.306 m Patient Age:    30 years            BP:           162/68 mmHg Patient Gender: M                   HR:           71 bpm. Exam Location:  Inpatient Procedure: 2D Echo, Cardiac Doppler and Color  Doppler Indications:    Aortic Stenosis 424.1 / 135.0  History:        Patient has prior history of Echocardiogram examinations, most                 recent 10/02/2019. CAD, Aortic Valve Disease; Risk                 Factors:Hypertension, Dyslipidemia and Current Smoker.  Sonographer:    Vickie Epley RDCS Referring Phys: [id number] [facility] BHAGAT IMPRESSIONS  1. Left ventricular ejection fraction, by estimation, is 55 to 60%. The left ventricle has normal function. The left ventricle has no regional wall motion abnormalities. The left ventricular internal cavity size was moderately dilated. Left ventricular diastolic parameters are consistent with Grade II diastolic dysfunction (pseudonormalization).  2. Right ventricular systolic function is normal. The right ventricular size is normal. There is normal pulmonary artery systolic pressure.  3. Right atrial size was severely dilated.  4. The mitral valve is normal in structure. Trivial mitral valve regurgitation. No evidence of mitral stenosis.  5. The aortic valve is normal in structure. There is mild calcification of the aortic valve. There is mild thickening of the aortic valve. Aortic valve regurgitation is  moderate. Mild aortic valve stenosis. Aortic valve area, by VTI measures 1.00 cm. Aortic valve mean gradient measures 14.5 mmHg. Aortic valve Vmax measures 2.47 m/s.  6. The inferior vena cava is dilated in size with <50% respiratory variability, suggesting right atrial pressure of 15 mmHg. FINDINGS  Left Ventricle: Left ventricular ejection fraction, by estimation, is 55 to 60%. The left ventricle has normal function. The left ventricle has no regional wall motion abnormalities. The left ventricular internal cavity size was moderately dilated. There is no left ventricular hypertrophy. Left ventricular diastolic parameters are consistent with Grade II diastolic dysfunction (pseudonormalization). Right Ventricle: The right ventricular size is normal. No increase  in right ventricular wall thickness. Right ventricular systolic function is normal. There is normal pulmonary artery systolic pressure. The tricuspid regurgitant velocity is 1.73 m/s, and  with an assumed right atrial pressure of 15 mmHg, the estimated right ventricular systolic pressure is 93.7 mmHg. Left Atrium: Left atrial size was normal in size. Right Atrium: Right atrial size was severely dilated. Pericardium: There is no evidence of pericardial effusion. Mitral Valve: The mitral valve is normal in structure. Trivial mitral valve regurgitation. No evidence of mitral valve stenosis. Tricuspid Valve: The tricuspid valve is normal in structure. Tricuspid valve regurgitation is trivial. No evidence of tricuspid stenosis. Aortic Valve: The aortic valve is normal in structure. There is mild calcification of the aortic valve. There is mild thickening of the aortic valve. Aortic valve regurgitation is moderate. Mild aortic stenosis is present. Aortic valve mean gradient measures 14.5 mmHg. Aortic valve peak gradient measures 24.4 mmHg. Aortic valve area, by VTI measures 1.00 cm. Pulmonic Valve: The pulmonic valve was normal in structure. Pulmonic valve regurgitation is not visualized. No evidence of pulmonic stenosis. Aorta: The aortic root is normal in size and structure. Venous: The inferior vena cava is dilated in size with less than 50% respiratory variability, suggesting right atrial pressure of 15 mmHg. IAS/Shunts: No atrial level shunt detected by color flow Doppler.  LEFT VENTRICLE PLAX 2D LVIDd:         6.40 cm LVIDs:         4.20 cm LV PW:         1.00 cm LV IVS:        1.00 cm LVOT diam:     2.00 cm LV SV:         51 LV SV Index:   22 LVOT Area:     3.14 cm  LV Volumes (MOD) LV vol d, MOD A2C: 184.0 ml LV vol d, MOD A4C: 110.0 ml LV vol s, MOD A2C: 75.4 ml LV vol s, MOD A4C: 49.9 ml LV SV MOD A2C:     108.6 ml LV SV MOD A4C:     110.0 ml LV SV MOD BP:      93.6 ml RIGHT VENTRICLE TAPSE (M-mode): 2.2 cm  LEFT ATRIUM             Index       RIGHT ATRIUM           Index LA diam:        4.80 cm 2.08 cm/m  RA Area:     30.90 cm LA Vol (A2C):   68.4 ml 29.66 ml/m RA Volume:   113.00 ml 48.99 ml/m LA Vol (A4C):   51.0 ml 22.11 ml/m LA Biplane Vol: 62.9 ml 27.27 ml/m  AORTIC VALVE AV Area (Vmax):    0.95 cm AV Area (Vmean):   0.95  cm AV Area (VTI):     1.00 cm AV Vmax:           247.00 cm/s AV Vmean:          165.500 cm/s AV VTI:            0.512 m AV Peak Grad:      24.4 mmHg AV Mean Grad:      14.5 mmHg LVOT Vmax:         74.40 cm/s LVOT Vmean:        50.300 cm/s LVOT VTI:          0.162 m LVOT/AV VTI ratio: 0.32  AORTA Ao Root diam: 3.30 cm TRICUSPID VALVE TR Peak grad:   12.0 mmHg TR Vmax:        173.00 cm/s  SHUNTS Systemic VTI:  0.16 m Systemic Diam: 2.00 cm Skeet Latch MD Electronically signed by Skeet Latch MD Signature Date/Time: 09/28/2020/11:56:52 AM    Final     Cardiac Studies   Echocardiogram 09/28/2020: Impressions: 1. Left ventricular ejection fraction, by estimation, is 55 to 60%. The  left ventricle has normal function. The left ventricle has no regional  wall motion abnormalities. The left ventricular internal cavity size was  moderately dilated. Left ventricular  diastolic parameters are consistent with Grade II diastolic dysfunction  (pseudonormalization).  2. Right ventricular systolic function is normal. The right ventricular  size is normal. There is normal pulmonary artery systolic pressure.  3. Right atrial size was severely dilated.  4. The mitral valve is normal in structure. Trivial mitral valve  regurgitation. No evidence of mitral stenosis.  5. The aortic valve is normal in structure. There is mild calcification  of the aortic valve. There is mild thickening of the aortic valve. Aortic  valve regurgitation is moderate. Mild aortic valve stenosis. Aortic valve  area, by VTI measures 1.00 cm.  Aortic valve mean gradient measures 14.5 mmHg. Aortic  valve Vmax measures  2.47 m/s.  6. The inferior vena cava is dilated in size with <50% respiratory  variability, suggesting right atrial pressure of 15 mmHg.  _______________  Right/Left Cardiac Catheterization 09/29/2020:  Dist Cx lesion is 20% stenosed.  Prox RCA lesion is 70% stenosed.  Prox Cx lesion is 80% stenosed.  Post intervention, there is a 10% residual stenosis.  A drug-eluting stent was successfully placed using a STENT RESOLUTE ONYX 4.5X12.  Lat 1st Mrg lesion is 75% stenosed.  Mid Cx lesion is 80% stenosed.  Post intervention, there is a 0% residual stenosis.  A drug-eluting stent was successfully placed using a STENT RESOLUTE ONYX 4.0X12.  Previously placed Mid LAD stent (unknown type) is widely patent.  Mid RCA lesion is 100% stenosed.   1.  Widely patent LAD stent with no significant restenosis, chronically occluded right coronary artery with left-to-right collaterals, new 80% stenosis in proximal and mid left circumflex. 2.  Left ventricular angiography was not performed due to underlying chronic kidney disease. 3.  Right heart catheterization showed mild to moderate elevation in filling pressures, moderate pulmonary hypertension and normal cardiac output. 4.  Mild gradient across aortic valve.  Evaluating the gradient was difficult due to frequent PVCs. 5.  Successful IVUS guided drug-eluting stent placement to mid and proximal left circumflex.  Recommendations: Dual antiplatelet therapy for at least 6 months. Aggressive treatment of risk factors and blood pressure control. I increase carvedilol to 12.5 mg twice daily. The patient continues to be volume overloaded and probably requires 1 more  day of IV diuresis.  Monitor renal function closely.   Patient Profile     69 y.o. male with a history of CAD with known CTO of RCA and prior stenting to LAD in 04/2012, moderate aortic stenosis, paroxysmal SVT s/p ablation, hypertension, hyperlipidemia, CKD stage  III, and tobacco abuse who was admitted with chest pain and acute on chronic diastolic CHF.  Assessment & Plan    Principal Problem:   Unstable angina (HCC) Active Problems:   Coronary artery disease involving native coronary artery of native heart with unstable angina pectoris (HCC)   Acute on chronic diastolic heart failure (HCC)   Mild aortic stenosis by prior echocardiogram   Hyperlipidemia with target LDL less than 70   CKD stage G3b/A2, GFR 30-44 and albumin creatinine ratio 30-299 mg/g (HCC)   Frequent unifocal PVCs   Essential hypertension   Chest Pain with Known CAD (Coronary artery disease involving native coronary artery of native heart with unstable angina pectoris (HCC))/ - - presenting with Unstable angina - History of CAD. Last cath in 2017 showed patent stent in LAD and chronically occluded RCA with collaterals. - High-sensitivity troponin minimally elevated and flat at 25 >> 21. Not consistent with ACS. - EKG showed normal sinus rhythm with bigeminy PVCs but no acute ischemia.  - Echo showed LVEF of 55-60% with normal wall motion, grade 2 diastolic dysfunction, and moderate AI. - R/LHC showed widely patent LAD stent, chronically occluded RCA with left to right collaterals, and a new 80% lesions on both Prox & mid LCx - both Treated with DES (non-overlapping to avoid jailing OM1). Mild to moderate elevation in filling pressure noted as well as moderate pulmonary hypertension. Cardiac output was normal.  - Continue DAPT with Aspirin and Plavix. - Continue beta-blocker and high-intensity statin.  Acute on Chronic Diastolic CHF - BNP elevated at 605.  - Chest x-ray showed right perihilar infiltrate but no edema. Will repeat 2V chest x-ray tomorrow.  - Echo as above. -  Right heart cath yesterday showed elevated PA pressures of elevated PCWP and wedge pressure consistent with volume overload.  Discussed yesterday with [name], would recommend additional dosing of IV  Lasix. - Currently on IV Lasix 40mg  twice daily. Documented urine output of 1.6 L in the past 24 hours and net negative 3.8 L since admission. Weight down 4 lbs since discharge. Creatinine slightly up after cath yesterday but stable. - Patient feels like his abdomen is bloated and notes some epigastric discomfort.  - Continue current dose of IV Lasix for today. May be able to switch to PO Lasix tomorrow. -We will reassess prior to morning dose based on output - Coreg increased this morning for additional BP control. - Monitor daily weight, strict I/O's, and renal function.  Frequent PVCs - Continue to have bigeminy PVCs.  - Magnesium 2.1 on 10/4. - Potassium 3.5 today. Continue K-Dur 40mg  twice daily. - TSH normal. - Coreg increased as above. - Will consult EP as while he is still here for further evaluation of frequent PVCs -> he will be seen tomorrow.  I would like to get a plan in place prior to his discharge since he is very symptomatic with bigeminy.  Anticipate he may end up needing to use antiarrhythmic agents..   Lung Infiltrate - Chest -xray showed right perihilar infiltrate. Repeat x-ray on 10/6 showed persistent infiltrate. - He was prescribed started on Levaquin 750mg  twice daily x7 days at [facility] ED for possible infectious etiology. Patient  has not started this yet and this was not started on admission.  - WBC minimally elevated at 12.6 and 11.2. Afebrile. - Do not think this is infectious in nature. Will hold off on antibiotics for now. - Recommend repeat chest x-ray in a couple weeks with PCP.  Hypertension -difficult to control - BP still elevated this morning. - Coreg increased to 12.5mg  twice daily, yet remains hypertensive - Will also get renal artery duplex given uncontrolled hypertension, CHF, and CKD which puts him at risk for renal artery stenosis.  Hyperlipidemia - Lipid panel this admission: Total Cholesterol 113, Triglycerides 78, HDL 53, LDL 44. - LDL  goal <70 given CAD. - Continue home Crestor 20mg  daily.  CKD Stage IIIb - Creatinine 1.63 today, slightly up from 1.58 yesterday. Baseline around 1.6. - Continue to monitor with diuresis.   For questions or updates, please contact [facility] Please consult [email or web address] for contact info under    Signed, Darreld Mclean, PA-C  09/30/2020, 8:01 AM    ATTENDING ATTESTATION  I have seen, examined and evaluated the patient this AM along with Sande Rives, PA.  After reviewing all the available data and chart, we discussed the patients laboratory, study & physical findings as well as symptoms in detail. I agree with her findings, examination as well as impression recommendations as per our discussion.    Attending adjustments noted in italics.  Overall, is feeling better.  No chest discomfort or dyspnea with ambulation.  He only become symptomatic when he lies down and his heart rate slows.  This is likely when he goes back into more of a bigeminy pattern. Right heart cath numbers yesterday still showed volume overload.  I think diuresis today with potential conversion to p.o. tomorrow depending on urine output is reasonable.  This will allow [place] to give more time for blood pressure control and to allow for EP consultation.  Where he not so symptomatic with the PVCs, we can probably consider outpatient evaluation, but I suspect that this is one of the major presenting symptoms that he is having and therefore would like to have it addressed prior to discharge.    Glenetta Hew, M.D., M.S. Interventional Cardiologist   Pager # [phone] Phone # [phone] [address] [place], [place] [address]

## 2020-09-30 NOTE — Progress Notes (Signed)
CARDIAC REHAB PHASE I   PRE:  Rate/Rhythm: 61 SR bigeminy PVCs  BP:  Supine: 176/71  Sitting:   Standing:    SaO2: 96%RA  MODE:  Ambulation: 800 ft   POST:  Rate/Rhythm: 84 SR bigeminy PVCs  BP:  Supine:   Sitting: 196/62  Standing:    SaO2: 96%RA 0810-0915 Pt walked 800 ft on RA with steady gait and tolerated well. No CP. Tolerated well. Pt had read the CHF booklet. Stated he weighs every day and adheres to low sodium diet. Discussed importance of plavix with stent. Reviewed NTG use, encouraged smoking cessation of cigars, gave low sodium and heart healthy diets, and walking for exercise. Pt voiced understanding. Reviewed signs of CHF-pt very knowledgeable. Referring to [facility] CRP 2.    Graylon Good, RN BSN  09/30/2020 9:12 AM

## 2020-10-01 ENCOUNTER — Other Ambulatory Visit: Payer: Self-pay | Admitting: Cardiology

## 2020-10-01 ENCOUNTER — Inpatient Hospital Stay (HOSPITAL_COMMUNITY): Payer: No Typology Code available for payment source

## 2020-10-01 DIAGNOSIS — N183 Chronic kidney disease, stage 3 unspecified: Secondary | ICD-10-CM | POA: Diagnosis not present

## 2020-10-01 DIAGNOSIS — I10 Essential (primary) hypertension: Secondary | ICD-10-CM | POA: Diagnosis not present

## 2020-10-01 DIAGNOSIS — Z79899 Other long term (current) drug therapy: Secondary | ICD-10-CM

## 2020-10-01 LAB — COMPREHENSIVE METABOLIC PANEL
ALT: 56 U/L — ABNORMAL HIGH (ref 0–44)
AST: 41 U/L (ref 15–41)
Albumin: 3.2 g/dL — ABNORMAL LOW (ref 3.5–5.0)
Alkaline Phosphatase: 73 U/L (ref 38–126)
Anion gap: 11 (ref 5–15)
BUN: 37 mg/dL — ABNORMAL HIGH (ref 8–23)
CO2: 32 mmol/L (ref 22–32)
Calcium: 8.2 mg/dL — ABNORMAL LOW (ref 8.9–10.3)
Chloride: 99 mmol/L (ref 98–111)
Creatinine, Ser: 1.82 mg/dL — ABNORMAL HIGH (ref 0.61–1.24)
GFR calc non Af Amer: 37 mL/min — ABNORMAL LOW (ref >60)
Glucose, Bld: 157 mg/dL — ABNORMAL HIGH (ref 70–99)
Potassium: 3.6 mmol/L (ref 3.5–5.1)
Sodium: 142 mmol/L (ref 135–145)
Total Bilirubin: 1 mg/dL (ref 0.3–1.2)
Total Protein: 5.2 g/dL — ABNORMAL LOW (ref 6.5–8.1)

## 2020-10-01 MED ORDER — AMIODARONE HCL 200 MG PO TABS
400.0000 mg | ORAL_TABLET | Freq: Two times a day (BID) | ORAL | Status: DC
  Administered 2020-10-01: 400 mg via ORAL
  Filled 2020-10-01: qty 2

## 2020-10-01 MED ORDER — AMLODIPINE BESYLATE 10 MG PO TABS
10.0000 mg | ORAL_TABLET | Freq: Every day | ORAL | 0 refills | Status: DC
Start: 2020-10-02 — End: 2020-10-02

## 2020-10-01 MED ORDER — CARVEDILOL 12.5 MG PO TABS
12.5000 mg | ORAL_TABLET | Freq: Two times a day (BID) | ORAL | 0 refills | Status: DC
Start: 2020-10-01 — End: 2020-10-01

## 2020-10-01 MED ORDER — AMIODARONE HCL 200 MG PO TABS: ORAL_TABLET | ORAL | 0 refills | Status: DC

## 2020-10-01 MED ORDER — CLOPIDOGREL BISULFATE 75 MG PO TABS
75.0000 mg | ORAL_TABLET | Freq: Every day | ORAL | 2 refills | Status: DC
Start: 2020-10-02 — End: 2020-10-02

## 2020-10-01 MED ORDER — FUROSEMIDE 80 MG PO TABS: 80.0000 mg | ORAL_TABLET | Freq: Every day | ORAL | 11 refills | Status: DC

## 2020-10-01 MED FILL — AMLODIPINE BESYLATE 10 MG T: 10 | 30 days supply | Qty: 30 | Fill #0

## 2020-10-01 MED FILL — CARVEDILOL 12.5 MG TABLET: 12.5 | 30 days supply | Qty: 60 | Fill #0

## 2020-10-01 MED FILL — CLOPIDOGREL 75 MG TABLET: 75 | 30 days supply | Qty: 30 | Fill #0

## 2020-10-01 MED FILL — AMIODARONE HCL 200 MG TAB: 200 | 50 days supply | Qty: 70 | Fill #0

## 2020-10-01 MED FILL — FUROSEMIDE 80 MG TAB: 80 | 30 days supply | Qty: 30 | Fill #0

## 2020-10-01 NOTE — Discharge Summary (Signed)
Discharge Summary    Patient ID: Chase Barton MRN: [id number]; DOB: [birth year]  Admit date: 09/27/2020 Discharge date: 10/01/2020  Primary Care Provider: Pcp, No  Primary Cardiologist: [name], MD  Primary Electrophysiologist:  None   Discharge Diagnoses    Principal Problem:   Unstable angina [facility]) Active Problems:   Mild aortic stenosis by prior echocardiogram   Coronary artery disease involving native coronary artery of native heart with unstable angina pectoris ([place])   Hyperlipidemia with target LDL less than 70   Essential hypertension   Acute on chronic diastolic heart failure ([place])   CKD stage G3b/A2, GFR 30-44 and albumin creatinine ratio 30-299 mg/g (H[facility])   Frequent unifocal PVCs   Diagnostic Studies/Procedures    Right/Left Cardiac Catheterization 09/29/2020:  Dist Cx lesion is 20% stenosed.  Prox RCA lesion is 70% stenosed.  Prox Cx lesion is 80% stenosed.  Post intervention, there is a 10% residual stenosis.  A drug-eluting stent was successfully placed using a STENT RESOLUTE ONYX 4.5X12.  Lat 1st Mrg lesion is 75% stenosed.  Mid Cx lesion is 80% stenosed.  Post intervention, there is a 0% residual stenosis.  A drug-eluting stent was successfully placed using a STENT RESOLUTE ONYX 4.0X12.  Previously placed Mid LAD stent (unknown type) is widely patent.  Mid RCA lesion is 100% stenosed.  1. Widely patent LAD stent with no significant restenosis, chronically occluded right coronary artery with left-to-right collaterals, new 80% stenosis in proximal and mid left circumflex. 2. Left ventricular angiography was not performed due to underlying chronic kidney disease. 3. Right heart catheterization showed mild to moderate elevation in filling pressures, moderate pulmonary hypertension and normal cardiac output. 4. Mild gradient across aortic valve. Evaluating the gradient was difficult due to frequent PVCs. 5. Successful IVUS guided  drug-eluting stent placement to mid and proximal left circumflex.  Recommendations: Dual antiplatelet therapy for at least 6 months. Aggressive treatment of risk factors and blood pressure control. I increase carvedilol to 12.5 mg twice daily. The patient continues to be volume overloaded and probably requires 1 more day of IV diuresis. Monitor renal function closely.  Diagnostic Dominance: Co-dominant  Intervention    Echo: 09/28/20  IMPRESSIONS    1. Left ventricular ejection fraction, by estimation, is 55 to 60%. The  left ventricle has normal function. The left ventricle has no regional  wall motion abnormalities. The left ventricular internal cavity size was  moderately dilated. Left ventricular  diastolic parameters are consistent with Grade II diastolic dysfunction  (pseudonormalization).  2. Right ventricular systolic function is normal. The right ventricular  size is normal. There is normal pulmonary artery systolic pressure.  3. Right atrial size was severely dilated.  4. The mitral valve is normal in structure. Trivial mitral valve  regurgitation. No evidence of mitral stenosis.  5. The aortic valve is normal in structure. There is mild calcification  of the aortic valve. There is mild thickening of the aortic valve. Aortic  valve regurgitation is moderate. Mild aortic valve stenosis. Aortic valve  area, by VTI measures 1.00 cm.  Aortic valve mean gradient measures 14.5 mmHg. Aortic valve Vmax measures  2.47 m/s.  6. The inferior vena cava is dilated in size with <50% respiratory  variability, suggesting right atrial pressure of 15 mmHg.  _____________   History of Present Illness     Chase Barton is a 69 y.o. male with history of CAD s/p stenting to LAD and CTO of RCA, moderate aortic stenosis,  hypertension, PSVT s/p ablation, chronic kidney disease stage III, hyperlipidemia and tobacco smoking presented with multiple complaints.  History of  CAD s/p stenting to his LAD in May 2013.  Last right and left cardiac catheterization January 2017 showed two-vessel disease with widely patent proximal LAD stent and chronically occluded RCA with left-to-right collaterals.  Moderate aortic stenosis with mean gradient of 27 mmHg and valve area of 0.95 cm.  Last echocardiogram 09/2019 showed LV function of 60 to 65%, moderate diastolic dysfunction, moderate aortic stenosis.   Chase Barton plays golf routinely.  Recently has noted exertional limitation however able to complete 18-hole game.  After playing golf on Friday prior to admission he had severe shortness of breath, chest pressure, orthopnea and lower extremity edema.  This was his worst episode.  He has been sleeping on recliner. He went to [facility] emergency room.  Treated with 1 dose of Lasix for CHF and discharged.  He did not noticed any improvement on his symptoms and decided to come to [facility] for further evaluation.  Patient cannot tell if his symptoms similar to prior angina when he had stenting.  He reported early satiety, orthopnea, PND and lower extremity edema.  Reported low-sodium diet.  He was seen by nephrologist 1 month ago and his ACE or ARB was discontinued.  Since then his systolic blood pressure running in 140-150s range.  He has received both dose of Covid vaccine.  Denied Covid exposure.  Currently his breathing stable.  High-sensitivity troponin 25>>21.  BNP 605. Serum creatinine 1.65.  Chest x-ray showed right perihilar infiltrate. He was admitted for further work up.    Hospital Course     Consultants: EP    1. Unstable angina:  Presenting symptoms were consistent with combination of unstable angina and acute diastolic heart failure.  Underwent cath showing CTO of the RCA with 2 tandem lesions in the LCx treated with 2 nonoverlapping DES stents.  Trivial troponin elevation. Seen by cardiac rehab and ambulated without recurrent chest pain. -- Continue DAPT  with aspirin and Plavix. -- Continue beta-blocker and high intensity statin.    2.  Acute on chronic diastolic heart failure: Right heart cath clearly showed elevated filling pressures as the echocardiogram also showed dilated right atrium and IVC suggesting elevated pulmonary pressures. He was diuresed with IV lasix with improvement. -- Plan to start oral lasix day following discharge. Will be given   3. Frequent unifocal PVCs: Seen by EP.  His beta-blocker was increased to 12.5mg  BID. -- Recommendation was for amiodarone oral load followed by maintenance.  He will follow up with EP in the outpatient setting.  4. Mild aortic stenosis: stable on echo with moderate aortic regurgitation.    5. Essential hypertension: Blood pressures were variable this admission. -- Now on 10 mg of amlodipine +12.5 mg of carvedilol. Suspect he may need afterload reduction with hydralazine/nitrate combination as an outpatient -- Renal artery Dopplers checked.  Not yet read.  Can follow-up with [name]  6. Hyperlipidemia: Currently at goal based on labs drawn this admission.  -- Continue home dose Crestor 20 mg daily.  7. CKD stage G3b/A2: Creatinine increased slightly after being relatively stable.  This could be a partial effect of diuresis versus a combination of diuresis and contrast.  We will need to have chemistry panel checked next week.   -- We will hold his diuretic today at discharge and then restart oral diuretic tomorrow.        Did  the patient have an acute coronary syndrome (MI, NSTEMI, STEMI, etc) this admission?:  Yes                               AHA/ACC Clinical Performance & Quality Measures: 1. Aspirin prescribed? - Yes 2. ADP Receptor Inhibitor (Plavix/Clopidogrel, Brilinta/Ticagrelor or Effient/Prasugrel) prescribed (includes medically managed patients)? - Yes 3. Beta Blocker prescribed? - Yes 4. High Intensity Statin (Lipitor 40-80mg  or Crestor 20-40mg ) prescribed? - Yes 5. EF  assessed during THIS hospitalization? - Yes 6. For EF <40%, was ACEI/ARB prescribed? - No - Reason:  elevated CR 7. For EF <40%, Aldosterone Antagonist (Spironolactone or Eplerenone) prescribed? - Not Applicable (EF >/= 23%) 8. Cardiac Rehab Phase II ordered (including medically managed patients)? - Yes   _____________  Discharge Vitals Blood pressure (!) 153/60, pulse 75, temperature (!) 97 F (36.1 C), temperature source Axillary, resp. rate (!) 22, height 6\' 2"  (1.88 m), weight 102.3 kg, SpO2 93 %.  Filed Weights   09/29/20 0500 09/30/20 0556 10/01/20 0421  Weight: 103.7 kg 102.6 kg 102.3 kg    Labs & Radiologic Studies    CBC Recent Labs    09/29/20 0648 09/29/20 0648 09/29/20 1448 09/30/20 0440  WBC 11.2*  --   --  11.2*  HGB 14.9   < > 14.3  14.3 14.2  HCT 45.5   < > 42.0  42.0 43.9  MCV 90.6  --   --  91.1  PLT 171  --   --  162   < > = values in this interval not displayed.   Basic Metabolic Panel Recent Labs    09/30/20 0440 10/01/20 0142  NA 144 142  K 3.5 3.6  CL 102 99  CO2 28 32  GLUCOSE 131* 157*  BUN 35* 37*  CREATININE 1.63* 1.82*  CALCIUM 8.4* 8.2*   Liver Function Tests Recent Labs    10/01/20 0142  AST 41  ALT 56*  ALKPHOS 73  BILITOT 1.0  PROT 5.2*  ALBUMIN 3.2*   No results for input(s): LIPASE, AMYLASE in the last 72 hours. High Sensitivity Troponin:   Recent Labs  Lab 09/27/20 0745 09/27/20 1324  TROPONINIHS 25* 21*    BNP Invalid input(s): POCBNP D-Dimer No results for input(s): DDIMER in the last 72 hours. Hemoglobin A1C No results for input(s): HGBA1C in the last 72 hours. Fasting Lipid Panel No results for input(s): CHOL, HDL, LDLCALC, TRIG, CHOLHDL, LDLDIRECT in the last 72 hours. Thyroid Function Tests No results for input(s): TSH, T4TOTAL, T3FREE, THYROIDAB in the last 72 hours.  Invalid input(s): FREET3 _____________  DG Chest 2 View  Result Date: 09/29/2020 CLINICAL DATA:  Cough, pulmonary infiltrate  EXAM: CHEST - 2 VIEW COMPARISON:  09/27/2020 FINDINGS: Normal heart size and pulmonary vascularity. Prominence of RIGHT hilum. Mediastinal contours otherwise normal. Peribronchial thickening with subsegmental atelectasis at RIGHT base. Scattered interstitial infiltrates RIGHT perihilar to RIGHT base. Remaining lungs clear. No pleural effusion or pneumothorax. IMPRESSION: Persistent RIGHT perihilar/basilar infiltrate and basilar atelectasis. Electronically Signed   By: Lavonia Dana M.D.   On: 09/29/2020 08:28   DG Chest 2 View  Result Date: 09/27/2020 CLINICAL DATA:  Chest pain EXAM: CHEST - 2 VIEW COMPARISON:  01/01/2012 FINDINGS: Right perihilar infiltrate. No edema, effusion, or pneumothorax. Borderline heart size, likely accentuated by fat pad. Normal aortic contours. Rounded bony density posterior to the sternum on the lateral view is related to the  ribs and chronic. IMPRESSION: Right perihilar infiltrate. Followup PA and lateral chest X-ray is recommended in 3-4 weeks to ensure resolution. Electronically Signed   By: Monte Fantasia M.D.   On: 09/27/2020 07:53   CARDIAC CATHETERIZATION  Addendum Date: 09/29/2020    Dist Cx lesion is 20% stenosed.  Prox RCA lesion is 70% stenosed.  Prox Cx lesion is 80% stenosed.  Post intervention, there is a 10% residual stenosis.  A drug-eluting stent was successfully placed using a STENT RESOLUTE ONYX 4.5X12.  Lat 1st Mrg lesion is 75% stenosed.  Mid Cx lesion is 80% stenosed.  Post intervention, there is a 0% residual stenosis.  A drug-eluting stent was successfully placed using a STENT RESOLUTE ONYX 4.0X12.  Previously placed Mid LAD stent (unknown type) is widely patent.  Mid RCA lesion is 100% stenosed.  1.  Widely patent LAD stent with no significant restenosis, chronically occluded right coronary artery with left-to-right collaterals, new 80% stenosis in proximal and mid left circumflex. 2.  Left ventricular angiography was not performed due to  underlying chronic kidney disease. 3.  Right heart catheterization showed mild to moderate elevation in filling pressures, moderate pulmonary hypertension and normal cardiac output. 4.  Mild gradient across aortic valve.  Evaluating the gradient was difficult due to frequent PVCs. 5.  Successful IVUS guided drug-eluting stent placement to mid and proximal left circumflex. Recommendations: Dual antiplatelet therapy for at least 6 months. Aggressive treatment of risk factors and blood pressure control. I increase carvedilol to 12.5 mg twice daily. The patient continues to be volume overloaded and probably requires 1 more day of IV diuresis.  Monitor renal function closely.   Result Date: 09/29/2020  Dist Cx lesion is 20% stenosed.  Prox RCA lesion is 70% stenosed.  Prox Cx lesion is 80% stenosed.  Post intervention, there is a 10% residual stenosis.  A drug-eluting stent was successfully placed using a STENT RESOLUTE ONYX 4.5X12.  Lat 1st Mrg lesion is 75% stenosed.  Mid Cx lesion is 80% stenosed.  Post intervention, there is a 0% residual stenosis.  A drug-eluting stent was successfully placed using a STENT RESOLUTE ONYX 4.0X12.  Previously placed Mid LAD stent (unknown type) is widely patent.  Mid RCA lesion is 100% stenosed.  1.  Widely patent LAD stent with no significant restenosis, chronically occluded right coronary artery with left-to-right collaterals, new 80% stenosis in proximal and mid left circumflex. 2.  Left ventricular angiography was not performed due to underlying chronic kidney disease. 3.  Right heart catheterization showed mild to moderate elevation in filling pressures, moderate pulmonary hypertension and normal cardiac output. 4.  Successful IVUS guided drug-eluting stent placement to mid and proximal left circumflex. Recommendations: Dual antiplatelet therapy for at least 6 months. Aggressive treatment of risk factors and blood pressure control. I increase carvedilol to 12.5 mg  twice daily. The patient continues to be volume overloaded and probably requires 1 more day of IV diuresis.  Monitor renal function closely.   ECHOCARDIOGRAM COMPLETE  Result Date: 09/28/2020    ECHOCARDIOGRAM REPORT   Patient Name:   Chase Barton Date of Exam: 09/28/2020 Medical Rec #:  [id number]           Height:       74.0 in Accession #:    [id number]          Weight:       230.0 lb Date of Birth:  August 28, 1951  BSA:          2.306 m Patient Age:    19 years            BP:           162/68 mmHg Patient Gender: M                   HR:           71 bpm. Exam Location:  Inpatient Procedure: 2D Echo, Cardiac Doppler and Color Doppler Indications:    Aortic Stenosis 424.1 / 135.0  History:        Patient has prior history of Echocardiogram examinations, most                 recent 10/02/2019. CAD, Aortic Valve Disease; Risk                 Factors:Hypertension, Dyslipidemia and Current Smoker.  Sonographer:    Vickie Epley RDCS Referring Phys: [id number] [facility] IMPRESSIONS  1. Left ventricular ejection fraction, by estimation, is 55 to 60%. The left ventricle has normal function. The left ventricle has no regional wall motion abnormalities. The left ventricular internal cavity size was moderately dilated. Left ventricular diastolic parameters are consistent with Grade II diastolic dysfunction (pseudonormalization).  2. Right ventricular systolic function is normal. The right ventricular size is normal. There is normal pulmonary artery systolic pressure.  3. Right atrial size was severely dilated.  4. The mitral valve is normal in structure. Trivial mitral valve regurgitation. No evidence of mitral stenosis.  5. The aortic valve is normal in structure. There is mild calcification of the aortic valve. There is mild thickening of the aortic valve. Aortic valve regurgitation is moderate. Mild aortic valve stenosis. Aortic valve area, by VTI measures 1.00 cm. Aortic valve mean gradient measures  14.5 mmHg. Aortic valve Vmax measures 2.47 m/s.  6. The inferior vena cava is dilated in size with <50% respiratory variability, suggesting right atrial pressure of 15 mmHg. FINDINGS  Left Ventricle: Left ventricular ejection fraction, by estimation, is 55 to 60%. The left ventricle has normal function. The left ventricle has no regional wall motion abnormalities. The left ventricular internal cavity size was moderately dilated. There is no left ventricular hypertrophy. Left ventricular diastolic parameters are consistent with Grade II diastolic dysfunction (pseudonormalization). Right Ventricle: The right ventricular size is normal. No increase in right ventricular wall thickness. Right ventricular systolic function is normal. There is normal pulmonary artery systolic pressure. The tricuspid regurgitant velocity is 1.73 m/s, and  with an assumed right atrial pressure of 15 mmHg, the estimated right ventricular systolic pressure is 60.6 mmHg. Left Atrium: Left atrial size was normal in size. Right Atrium: Right atrial size was severely dilated. Pericardium: There is no evidence of pericardial effusion. Mitral Valve: The mitral valve is normal in structure. Trivial mitral valve regurgitation. No evidence of mitral valve stenosis. Tricuspid Valve: The tricuspid valve is normal in structure. Tricuspid valve regurgitation is trivial. No evidence of tricuspid stenosis. Aortic Valve: The aortic valve is normal in structure. There is mild calcification of the aortic valve. There is mild thickening of the aortic valve. Aortic valve regurgitation is moderate. Mild aortic stenosis is present. Aortic valve mean gradient measures 14.5 mmHg. Aortic valve peak gradient measures 24.4 mmHg. Aortic valve area, by VTI measures 1.00 cm. Pulmonic Valve: The pulmonic valve was normal in structure. Pulmonic valve regurgitation is not visualized. No evidence of pulmonic stenosis. Aorta: The aortic root  is normal in size and structure.  Venous: The inferior vena cava is dilated in size with less than 50% respiratory variability, suggesting right atrial pressure of 15 mmHg. IAS/Shunts: No atrial level shunt detected by color flow Doppler.  LEFT VENTRICLE PLAX 2D LVIDd:         6.40 cm LVIDs:         4.20 cm LV PW:         1.00 cm LV IVS:        1.00 cm LVOT diam:     2.00 cm LV SV:         51 LV SV Index:   22 LVOT Area:     3.14 cm  LV Volumes (MOD) LV vol d, MOD A2C: 184.0 ml LV vol d, MOD A4C: 110.0 ml LV vol s, MOD A2C: 75.4 ml LV vol s, MOD A4C: 49.9 ml LV SV MOD A2C:     108.6 ml LV SV MOD A4C:     110.0 ml LV SV MOD BP:      93.6 ml RIGHT VENTRICLE TAPSE (M-mode): 2.2 cm LEFT ATRIUM             Index       RIGHT ATRIUM           Index LA diam:        4.80 cm 2.08 cm/m  RA Area:     30.90 cm LA Vol (A2C):   68.4 ml 29.66 ml/m RA Volume:   113.00 ml 48.99 ml/m LA Vol (A4C):   51.0 ml 22.11 ml/m LA Biplane Vol: 62.9 ml 27.27 ml/m  AORTIC VALVE AV Area (Vmax):    0.95 cm AV Area (Vmean):   0.95 cm AV Area (VTI):     1.00 cm AV Vmax:           247.00 cm/s AV Vmean:          165.500 cm/s AV VTI:            0.512 m AV Peak Grad:      24.4 mmHg AV Mean Grad:      14.5 mmHg LVOT Vmax:         74.40 cm/s LVOT Vmean:        50.300 cm/s LVOT VTI:          0.162 m LVOT/AV VTI ratio: 0.32  AORTA Ao Root diam: 3.30 cm TRICUSPID VALVE TR Peak grad:   12.0 mmHg TR Vmax:        173.00 cm/s  SHUNTS Systemic VTI:  0.16 m Systemic Diam: 2.00 cm Skeet Latch MD Electronically signed by Skeet Latch MD Signature Date/Time: 09/28/2020/11:56:52 AM    Final    VAS US RENAL ARTERY DUPLEX  Result Date: 10/01/2020 ABDOMINAL VISCERAL Indications: Hypertension, CRD- stage 3, GFR 30-59 ml/min. Limitations: Air/bowel gas, obesity and patient discomfort. Comparison Study: No prior study Performing Technologist: Maudry Mayhew MHA, RDMS, RVT, RDCS  Examination Guidelines: A complete evaluation includes B-mode imaging, spectral Doppler, color Doppler,  and power Doppler as needed of all accessible portions of each vessel. Bilateral testing is considered an integral part of a complete examination. Limited examinations for reoccurring indications may be performed as noted.  Duplex Findings: +--------------------+--------+--------+------+------------------+ Mesenteric          PSV cm/sEDV cm/sPlaque     Comments      +--------------------+--------+--------+------+------------------+ Aorta Prox            188  43                            +--------------------+--------+--------+------+------------------+ Celiac Artery Origin  124                                    +--------------------+--------+--------+------+------------------+ SMA Proximal                              Unable to insonate +--------------------+--------+--------+------+------------------+    +------------------+--------+--------+-------+ Right Renal ArteryPSV cm/sEDV cm/sComment +------------------+--------+--------+-------+ Origin               59      14           +------------------+--------+--------+-------+ Proximal            148      36           +------------------+--------+--------+-------+ Mid                 151      35           +------------------+--------+--------+-------+ Distal              167      30           +------------------+--------+--------+-------+ +-----------------+--------+--------+-------+ Left Renal ArteryPSV cm/sEDV cm/sComment +-----------------+--------+--------+-------+ Origin             216      52           +-----------------+--------+--------+-------+ Proximal            46      9            +-----------------+--------+--------+-------+ Mid                 39      8            +-----------------+--------+--------+-------+ Distal              40      7            +-----------------+--------+--------+-------+ +------------+--------+--------+----+-----------+--------+--------+----+ Right  KidneyPSV cm/sEDV cm/sRI  Left KidneyPSV cm/sEDV cm/sRI   +------------+--------+--------+----+-----------+--------+--------+----+ Upper Pole  21      5       0.75Upper Pole 24      4       0.82 +------------+--------+--------+----+-----------+--------+--------+----+ Mid         36      8       0.77Mid        23      5       0.76 +------------+--------+--------+----+-----------+--------+--------+----+ Lower Pole  14      4       0.70Lower Pole 30      7       0.77 +------------+--------+--------+----+-----------+--------+--------+----+ Hilar       -37     -8      0.78Hilar      -20     -4      0.79 +------------+--------+--------+----+-----------+--------+--------+----+ +------------------+---------+------------------+---------+ Right Kidney               Left Kidney                 +------------------+---------+------------------+---------+ RAR  RAR                         +------------------+---------+------------------+---------+ RAR (manual)      0.89     RAR (manual)      0.87      +------------------+---------+------------------+---------+ Cortex            17/2 cm/sCortex            22/5 cm/s +------------------+---------+------------------+---------+ Cortex thickness           Corex thickness             +------------------+---------+------------------+---------+ Kidney length (cm)10.20    Kidney length (cm)10.20     +------------------+---------+------------------+---------+  Summary: Renal:  Right: 1-59% stenosis of the right renal artery. Cyst(s) noted. RRV        flow present. Left:  1-59% stenosis of the left renal artery. LRV flow present. Mesenteric: Normal Celiac artery findings.  Note: Renal/aortic ratio may be falsely diminished due to elevated aorta velocities.  *See table(s) above for measurements and observations.     Preliminary    Disposition   Pt is being discharged home today in good  condition.  Follow-up Plans & Appointments     Follow-up Information    Vickie Epley, MD Follow up on 11/08/2020.   Specialty: Cardiology Why: at 1130 for post hospital follow up for amiodarone Contact information: [place] [address] [phone]        Loel Dubonnet, NP Follow up on 10/13/2020.   Specialty: Cardiology Why: ar 2:30pm for your follow up appt Contact information: [place] [address] [phone]        [facility] Follow up on 10/06/2020.   Why: Please go in for repeat labs  Contact information: [address] [place] [address] [phone]             Discharge Instructions    Amb Referral to Cardiac Rehabilitation   Complete by: As directed    Referring to [facility] CRP 2   Diagnosis: Coronary Stents   After initial evaluation and assessments completed: Virtual Based Care may be provided alone or in conjunction with Phase 2 Cardiac Rehab based on patient barriers.: Yes   Call MD for:  redness, tenderness, or signs of infection (pain, swelling, redness, odor or green/yellow discharge around incision site)   Complete by: As directed    Diet - low sodium heart healthy   Complete by: As directed    Discharge instructions   Complete by: As directed    Radial Site Care Refer to this sheet in the next few weeks. These instructions provide you with information on caring for yourself after your procedure. Your caregiver may also give you more specific instructions. Your treatment has been planned according to current medical practices, but problems sometimes occur. Call your caregiver if you have any problems or questions after your procedure. HOME CARE INSTRUCTIONS You may shower the day after the procedure.Remove the bandage (dressing) and gently wash the site with plain soap and water.Gently pat the site dry.  Do not apply powder or lotion  to the site.  Do not submerge the affected site in water for 3 to 5 days.  Inspect the site at least twice daily.  Do not flex or bend the affected arm for 24 hours.  No lifting over 5 pounds (2.3 kg) for 5 days after  your procedure.  Do not drive home if you are discharged the same day of the procedure. Have someone else drive you.  You may drive 24 hours after the procedure unless otherwise instructed by your caregiver.  What to expect: Any bruising will usually fade within 1 to 2 weeks.  Blood that collects in the tissue (hematoma) may be painful to the touch. It should usually decrease in size and tenderness within 1 to 2 weeks.  SEEK IMMEDIATE MEDICAL CARE IF: You have unusual pain at the radial site.  You have redness, warmth, swelling, or pain at the radial site.  You have drainage (other than a small amount of blood on the dressing).  You have chills.  You have a fever or persistent symptoms for more than 72 hours.  You have a fever and your symptoms suddenly get worse.  Your arm becomes pale, cool, tingly, or numb.  You have heavy bleeding from the site. Hold pressure on the site.   PLEASE DO NOT MISS ANY DOSES OF YOUR PLAVIX!!!!! Also keep a log of you blood pressures and bring back to your follow up appt. Please call the office with any questions.   Patients taking blood thinners should generally stay away from medicines like ibuprofen, Advil, Motrin, naproxen, and Aleve due to risk of stomach bleeding. You may take Tylenol as directed or talk to your primary doctor about alternatives.  Sliding scale Lasix: Weigh yourself when you get home, then Daily in the Morning. Your dry weight will be what your scale says on the day you return home.(here your weight is 225.5 lbs--this may be different than your home scale.).  If you gain more than 3 pounds from dry weight: Increase the Lasix dosing to 80 mg in the morning and take a second 40 mg dose (1/2 tablet) in the afternoon (roughly  6 hours after first dose) until weight returns to baseline dry weight. If weight gain is greater than 5 pounds in 2 days: Increased to Lasix 80 mg mg twice a day and contact the office for further assistance if weight does not go down the next day. If the weight goes down more than 3 pounds from dry weight: Hold Lasix until it returns to baseline dry weight   Increase activity slowly   Complete by: As directed       Discharge Medications   Allergies as of 10/01/2020      Reactions   Atorvastatin Nausea And Vomiting, Other (See Comments)      Medication List    STOP taking these medications   lisinopril 20 MG tablet Commonly known as: ZESTRIL   metoprolol tartrate 25 MG tablet Commonly known as: LOPRESSOR     TAKE these medications   acetaminophen 500 MG tablet Commonly known as: TYLENOL Take 1,000 mg by mouth daily.   amiodarone 200 MG tablet Commonly known as: PACERONE Take 400mg  (2tabs) twice a day for 5 days, then 400mg  daily for 5 days, then 200mg  daily   amLODipine 10 MG tablet Commonly known as: NORVASC Take 1 tablet (10 mg total) by mouth daily. Start taking on: October 02, 2020   ascorbic acid 500 MG tablet Commonly known as: VITAMIN C Take 500 mg by mouth daily.   aspirin EC 81 MG tablet Take 81 mg by mouth daily.   carvedilol 12.5 MG tablet Commonly known as: COREG Take 1 tablet (12.5 mg total) by mouth 2 (two) times daily with a meal.   clopidogrel 75 MG tablet  Commonly known as: PLAVIX Take 1 tablet (75 mg total) by mouth daily with breakfast. Start taking on: October 02, 2020   furosemide 80 MG tablet Commonly known as: Lasix Take 1 tablet (80 mg total) by mouth daily.   latanoprost 0.005 % ophthalmic solution Commonly known as: XALATAN Place 1 drop into both eyes daily.   nitroGLYCERIN 0.4 MG SL tablet Commonly known as: NITROSTAT Place 0.4 mg under the tongue every 5 (five) minutes as needed for chest pain.   ranolazine 1000 MG SR  tablet Commonly known as: RANEXA Take 1 tablet (1,000 mg total) by mouth 2 (two) times daily.   rosuvastatin 20 MG tablet Commonly known as: CRESTOR Take 1 tablet (20 mg total) by mouth daily.   tamsulosin 0.4 MG Caps capsule Commonly known as: FLOMAX Take 0.4 mg by mouth daily.   traMADol 50 MG tablet Commonly known as: ULTRAM Take 50 mg by mouth every 6 (six) hours as needed for moderate pain.        Outstanding Labs/Studies   BMET 10/13  Duration of Discharge Encounter   Greater than 30 minutes including physician time.  Signed, Reino Bellis, NP 10/01/2020, 1:39 PM

## 2020-10-01 NOTE — Progress Notes (Signed)
Progress Note  Patient Name: Chase Barton Date of Encounter: 10/01/2020  [facility] Cardiologist: [name], MD  Subjective   Feels better today.  Still having PVCs, they get better as he walks. He had one episode where he woke up, but disorientated and his oxygen levels were down.  Went up with nasal cannula.  Better now. Also unable with abdominal bloating and gas.  Felt somewhat better after BM.  Inpatient Medications    Scheduled Meds: . amLODipine  10 mg Oral Daily  . aspirin EC  81 mg Oral Daily  . carvedilol  12.5 mg Oral BID WC  . clopidogrel  75 mg Oral Q breakfast  . potassium chloride  40 mEq Oral BID  . rosuvastatin  20 mg Oral Daily  . sodium chloride flush  3 mL Intravenous Q12H  . tamsulosin  0.4 mg Oral Daily   Continuous Infusions: . sodium chloride     PRN Meds: sodium chloride, acetaminophen, labetalol, nitroGLYCERIN, ondansetron (ZOFRAN) IV, sodium chloride flush, traMADol   Vital Signs    Vitals:   09/30/20 2352 10/01/20 0421 10/01/20 0600 10/01/20 0700  BP: (!) 155/58 (!) 157/59    Pulse: 67 (!) 50 70 69  Resp: 17 17 (!) 21 17  Temp: 98.5 F (36.9 C) 98.2 F (36.8 C)    TempSrc: Oral Oral    SpO2: 96% 97% 94% 95%  Weight:  102.3 kg    Height:        Intake/Output Summary (Last 24 hours) at 10/01/2020 0947 Last data filed at 10/01/2020 0454 Gross per 24 hour  Intake 243 ml  Output 2025 ml  Net -1782 ml   Last 3 Weights 10/01/2020 09/30/2020 09/29/2020  Weight (lbs) 225 lb 8.5 oz 226 lb 1.6 oz 228 lb 9.6 oz  Weight (kg) 102.3 kg 102.558 kg 103.692 kg      Telemetry    Sinus rhythm with PVCs and frequent bigeminy pattern.- Personally Reviewed  ECG    N/A- Personally Reviewed  Physical Exam   GEN: No acute distress.  Lying in bed.  Appears comfortable. Neck: No JVD or bruit Cardiac:  Regular rate rhythm with frequent ectopy.,  2/6 SEM at RUSB.  Otherwise no, rubs, or gallops.  Respiratory: Clear to auscultation  bilaterally.  Nonlabored GI: Soft, nontender, non-distended; mild fullness, but no ascites. MS: No edema; No deformity. Neuro:  Nonfocal  Psych: Normal affect   Labs    High Sensitivity Troponin:   Recent Labs  Lab 09/27/20 0745 09/27/20 1324  TROPONINIHS 25* 21*      Chemistry Recent Labs  Lab 09/27/20 0745 09/27/20 0745 09/28/20 0521 09/28/20 0521 09/29/20 3267 09/29/20 0648 09/29/20 1448 09/30/20 0440 10/01/20 0142  NA 141   < > 144   < > 143   < > 145  145 144 142  K 3.7   < > 3.5   < > 3.4*   < > 3.5  3.4* 3.5 3.6  CL 104   < > 103   < > 101  --   --  102 99  CO2 25   < > 30   < > 30  --   --  28 32  GLUCOSE 137*   < > 128*   < > 128*  --   --  131* 157*  BUN 27*   < > 27*   < > 33*  --   --  35* 37*  CREATININE 1.65*   < >  1.51*   < > 1.58*  --   --  1.63* 1.82*  CALCIUM 8.9   < > 8.6*   < > 8.4*  --   --  8.4* 8.2*  PROT  --   --   --   --   --   --   --   --  5.2*  ALBUMIN  --   --   --   --   --   --   --   --  3.2*  AST  --   --   --   --   --   --   --   --  41  ALT  --   --   --   --   --   --   --   --  56*  ALKPHOS  --   --   --   --   --   --   --   --  73  BILITOT  --   --   --   --   --   --   --   --  1.0  GFRNONAA 41*   < > 46*   < > 44*  --   --  42* 37*  GFRAA 48*  --  53*  --   --   --   --   --   --   ANIONGAP 12   < > 11   < > 12  --   --  14 11   < > = values in this interval not displayed.     Hematology Recent Labs  Lab 09/28/20 0521 09/28/20 0521 09/29/20 0648 09/29/20 1448 09/30/20 0440  WBC 9.3  --  11.2*  --  11.2*  RBC 4.71  --  5.02  --  4.82  HGB 13.8   < > 14.9 14.3  14.3 14.2  HCT 43.4   < > 45.5 42.0  42.0 43.9  MCV 92.1  --  90.6  --  91.1  MCH 29.3  --  29.7  --  29.5  MCHC 31.8  --  32.7  --  32.3  RDW 14.2  --  14.3  --  14.2  PLT 141*  --  171  --  162   < > = values in this interval not displayed.    BNP Recent Labs  Lab 09/27/20 1334  BNP 605.4*     DDimer No results for input(s): DDIMER in the  last 168 hours.   Radiology    CARDIAC CATHETERIZATION  Addendum Date: 09/29/2020    Dist Cx lesion is 20% stenosed.  Prox RCA lesion is 70% stenosed.  Prox Cx lesion is 80% stenosed.  Post intervention, there is a 10% residual stenosis.  A drug-eluting stent was successfully placed using a STENT RESOLUTE ONYX 4.5X12.  Lat 1st Mrg lesion is 75% stenosed.  Mid Cx lesion is 80% stenosed.  Post intervention, there is a 0% residual stenosis.  A drug-eluting stent was successfully placed using a STENT RESOLUTE ONYX 4.0X12.  Previously placed Mid LAD stent (unknown type) is widely patent.  Mid RCA lesion is 100% stenosed.  1.  Widely patent LAD stent with no significant restenosis, chronically occluded right coronary artery with left-to-right collaterals, new 80% stenosis in proximal and mid left circumflex. 2.  Left ventricular angiography was not performed due to underlying chronic kidney disease. 3.  Right heart catheterization showed mild to moderate elevation in  filling pressures, moderate pulmonary hypertension and normal cardiac output. 4.  Mild gradient across aortic valve.  Evaluating the gradient was difficult due to frequent PVCs. 5.  Successful IVUS guided drug-eluting stent placement to mid and proximal left circumflex. Recommendations: Dual antiplatelet therapy for at least 6 months. Aggressive treatment of risk factors and blood pressure control. I increase carvedilol to 12.5 mg twice daily. The patient continues to be volume overloaded and probably requires 1 more day of IV diuresis.  Monitor renal function closely.   Result Date: 09/29/2020  Dist Cx lesion is 20% stenosed.  Prox RCA lesion is 70% stenosed.  Prox Cx lesion is 80% stenosed.  Post intervention, there is a 10% residual stenosis.  A drug-eluting stent was successfully placed using a STENT RESOLUTE ONYX 4.5X12.  Lat 1st Mrg lesion is 75% stenosed.  Mid Cx lesion is 80% stenosed.  Post intervention, there is a 0%  residual stenosis.  A drug-eluting stent was successfully placed using a STENT RESOLUTE ONYX 4.0X12.  Previously placed Mid LAD stent (unknown type) is widely patent.  Mid RCA lesion is 100% stenosed.  1.  Widely patent LAD stent with no significant restenosis, chronically occluded right coronary artery with left-to-right collaterals, new 80% stenosis in proximal and mid left circumflex. 2.  Left ventricular angiography was not performed due to underlying chronic kidney disease. 3.  Right heart catheterization showed mild to moderate elevation in filling pressures, moderate pulmonary hypertension and normal cardiac output. 4.  Successful IVUS guided drug-eluting stent placement to mid and proximal left circumflex. Recommendations: Dual antiplatelet therapy for at least 6 months. Aggressive treatment of risk factors and blood pressure control. I increase carvedilol to 12.5 mg twice daily. The patient continues to be volume overloaded and probably requires 1 more day of IV diuresis.  Monitor renal function closely.    Cardiac Studies   Echocardiogram 09/28/2020: EF 55 to 60%.  Moderate LV dilation.  GRII DD.  Normal RV size and function.  Normal pressures.  Severe RA dilation.  Moderate aortic valve regurgitation with mild aortic stenosis dilated IVC suggesting elevated RAP  _______________  Right/Left Cardiac Catheterization 09/29/2020:  Dist Cx lesion is 20% stenosed.  Prox RCA lesion is 70% stenosed.  Prox Cx lesion is 80% stenosed.  Post intervention, there is a 10% residual stenosis.  A drug-eluting stent was successfully placed using a STENT RESOLUTE ONYX 4.5X12.  Lat 1st Mrg lesion is 75% stenosed.  Mid Cx lesion is 80% stenosed.  Post intervention, there is a 0% residual stenosis.  A drug-eluting stent was successfully placed using a STENT RESOLUTE ONYX 4.0X12.  Previously placed Mid LAD stent (unknown type) is widely patent.  Mid RCA lesion is 100% stenosed.  1. Widely  patent LAD stent with no significant restenosis, chronically occluded right coronary artery with left-to-right collaterals, new 80% stenosis in proximal and mid left circumflex. 2. Left ventricular angiography was not performed due to underlying chronic kidney disease. 3. Right heart catheterization showed mild to moderate elevation in filling pressures, moderate pulmonary hypertension and normal cardiac output. 4. Mild gradient across aortic valve. Evaluating the gradient was difficult due to frequent PVCs. 5. Successful IVUS guided drug-eluting stent placement to mid and proximal left circumflex.  Recommendations: Dual antiplatelet therapy for at least 6 months. Aggressive treatment of risk factors and blood pressure control. I increase carvedilol to 12.5 mg twice daily. The patient continues to be volume overloaded and probably requires 1 more day of IV diuresis. Monitor renal function  closely.  Patient Profile     69 y.o. male with a history of CAD with known CTO of RCA and prior stenting to LAD in 04/2012, moderate aortic stenosis, paroxysmal SVT s/p ablation, hypertension, hyperlipidemia, CKD stage III, and tobacco abuse who was admitted with chest pain and acute on chronic diastolic CHF.  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  Underwent cath showing CTO of the RCA with 2 tandem lesions in the LCx treated with 2 nonoverlapping DES stents.  Trivial troponin elevation.  Continue DAPT with aspirin and Plavix.  Continue beta-blocker and high intensity statin.    Active problems   Acute on chronic diastolic heart failure (HCC)  Right heart cath clearly showed elevated filling pressures as the echocardiogram also showed dilated right atrium and IVC suggesting elevated pulmonary pressures.  Interestingly, the right ventricle was not enlarged.  Overall feeling better with diuresis.  He appears somewhat euvolemic at this point with his creatinine increasing.  Will hold IV diuretic today and start oral diuresis tomorrow   For oral diuretic regimen will start with furosemide 80 mg daily and sliding scale as follows:  Sliding scale Lasix: Weigh yourself when you get home, then Daily in the Morning. Your dry weight will be what your scale says on the day you return home.(here your weight is 225.5 lbs--this may be different than your home scale.).   If you gain more than 3 pounds from dry weight: Increase the Lasix dosing to 80 mg in the morning and take a second 40 mg dose (1/2 tablet) in the afternoon (roughly 6 hours after first dose) until weight returns to baseline dry weight.  If weight gain is greater than 5 pounds in 2 days: Increased to Lasix 80 mg mg twice a day and contact the office for further assistance if weight does not go down the next day.  If the weight goes down more than 3 pounds from dry weight: Hold Lasix until it returns to baseline dry weight    Frequent unifocal PVCs.  Appreciate EP consult.  We have placed his beta-blocker as far as we feel safe for now. ->  Recommendation is amiodarone oral load followed by maintenance.  He will follow up with EP in the outpatient setting.    Mild aortic stenosis by prior echocardiogram -> stable on echo with moderate aortic  regurgitation.  Not likely related to current symptoms.    Essential hypertension -> blood pressure continues to be difficult.  He is now on 10 mg of amlodipine +12.5 mg of carvedilol.  I suspect that he probably will end up needing to be on additional afterload reduction such as hydralazine/nitrate versus restarting ARB (but would defer plus minus ARB to his nephrologist.  Currently blood pressures have not really changed much.  I suspect that as the medication doses continue  to kick in, his pressure should improve.  Renal artery Dopplers checked.  Not yet read.  Can follow-up with [name]    Hyperlipidemia with target LDL less than 70  Currently at goal based on labs drawn this admission.  Continue home dose Crestor 20 mg daily.     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  This could be a partial effect of diuresis versus a combination of diuresis and contrast.  We will need to have chemistry panel checked next week.    We will hold his diuretic today and then restart oral diuretic tomorrow.       Overall, okay for discharge today.  Restart oral diuretic tomorrow.  Check chemistry panel midweek next week at the [facility] [facility].   For questions or updates, please contact [place] Please consult [email or web address] for contact info under        Signed, Glenetta Hew, MD, [place] 10/01/2020, 9:47 AM

## 2020-10-01 NOTE — Consult Note (Addendum)
ELECTROPHYSIOLOGY CONSULT NOTE    Patient ID: Chase Barton MRN: [id number], DOB/AGE: 08-08-1951 69 y.o.  Admit date: 09/27/2020 Date of Consult: 10/01/2020  Primary Physician: Merryl Hacker, No Primary Cardiologist: [name], MD  Electrophysiologist:  New to Dr. Quentin Ore  Referring Provider: Dr. Ellyn Hack  Patient Profile: Chase Barton is a 69 y.o. male with a history of CAD s/p stenting to LAD and CTO of RCA, moderate AS< HTN, PSVT s/p ablation, chronic kidney disease stage III, HLD, and tobacco use who is being seen today for the evaluation of PVCs at the request of Dr. Ellyn Hack.  HPI:  Chase Barton is a 69 y.o. male with history as above.   Pt presented 09/27/2020 with severe SOB, chest pressure, orthopnea and LE edema after a round of golf. He has previously presented to the [facility] ER, treated with IV lasix, and sent home. His symptoms did not improve so her presented to [facility] for further evaluation. HS trop 25 >> 21. BNP 605. Cr 1.65. CXR with right perihilar infiltrate. Pt was unsure if symptoms were similar to his previous stenting.   Echo 09/28/2020 showed LVEF 55-60% with grade 2 DD  [facility] 09/29/2020 showed: 1.  Widely patent LAD stent with no significant restenosis, chronically occluded right coronary artery with left-to-right collaterals, new 80% stenosis in proximal and mid left circumflex which were stented 2.  Left ventricular angiography was not performed due to underlying chronic kidney disease. 3.  Right heart catheterization showed mild to moderate elevation in filling pressures, moderate pulmonary hypertension and normal cardiac output. 4.  Mild gradient across aortic valve.  Evaluating the gradient was difficult due to frequent PVCs. 5.  Successful IVUS guided drug-eluting stent placement to mid and proximal left circumflex.  EP was asked to see due to very frequent PVCs in a pattern of bigeminy.  He is feeling OK currently. Briefly required O2  overnight for sats in upper 80s, which is new for him. Currently satting 92-95 in bed. He denies chest pain. He does have occasional palpitations, and can feel that his heart is beating irregularly in the quiet of the evening.     He had an SVT ablation in [place], [place] approx 3 years ago and has felt well from this standpoint since then.   Past Medical History:  Diagnosis Date  . Aortic stenosis 04/2012   mild-moderate  . Chronic kidney disease    Stage III  . Coronary artery disease 04/2012   Cardiac cath 04/2012: LAD, 90% proximal, RCA: 100% mid with left to right collaterals, Normal EF, Moderate aortic stenosis (peak -peak gradient of 23 mm Hg), LAD PCI with a DES placment.   . Diverticulitis   . Heart murmur   . Hyperlipidemia   . Hypertension 06/20/2012  . PSVT (paroxysmal supraventricular tachycardia) ([place]) 2018   Status post ablation in March 2018 in [place].  . Substance abuse ([place])    remote  . Tobacco abuse      Surgical History:  Past Surgical History:  Procedure Laterality Date  . CARDIAC CATHETERIZATION     x1 stent  . CARDIAC CATHETERIZATION    . CARDIAC CATHETERIZATION    . CARDIAC CATHETERIZATION N/A 01/05/2016   Procedure: Right/Left Heart Cath and Coronary Angiography;  Surgeon: Wellington Hampshire, MD;  Location: [place] [facility];  Service: Cardiovascular;  Laterality: N/A;  . CARDIAC ELECTROPHYSIOLOGY STUDY AND ABLATION    . CORONARY STENT INTERVENTION N/A 09/29/2020   Procedure: CORONARY STENT  INTERVENTION;  Surgeon: Wellington Hampshire, MD;  Location: [place] [facility];  Service: Cardiovascular;  Laterality: N/A;  . INTRAVASCULAR ULTRASOUND/IVUS N/A 09/29/2020   Procedure: Intravascular Ultrasound/IVUS;  Surgeon: Wellington Hampshire, MD;  Location: [place] [facility];  Service: Cardiovascular;  Laterality: N/A;  . RIGHT/LEFT HEART CATH AND CORONARY ANGIOGRAPHY N/A 09/29/2020   Procedure: RIGHT/LEFT HEART CATH AND CORONARY ANGIOGRAPHY;  Surgeon: Wellington Hampshire, MD;  Location: [place] [facility];  Service: Cardiovascular;  Laterality: N/A;     Medications Prior to Admission  Medication Sig Dispense Refill Last Dose  . acetaminophen (TYLENOL) 500 MG tablet Take 1,000 mg by mouth daily.   09/27/2020 at Unknown time  . ascorbic acid (VITAMIN C) 500 MG tablet Take 500 mg by mouth daily.    09/27/2020 at Unknown time  . aspirin EC 81 MG tablet Take 81 mg by mouth daily.   09/27/2020 at Unknown time  . latanoprost (XALATAN) 0.005 % ophthalmic solution Place 1 drop into both eyes daily.    09/27/2020 at Unknown time  . metoprolol tartrate (LOPRESSOR) 25 MG tablet Take 1 tablet (25 mg total) by mouth daily. 10 tablet 0 09/27/2020 at 0800  . nitroGLYCERIN (NITROSTAT) 0.4 MG SL tablet Place 0.4 mg under the tongue every 5 (five) minutes as needed for chest pain.   unknown at unknown  . ranolazine (RANEXA) 1000 MG SR tablet Take 1 tablet (1,000 mg total) by mouth 2 (two) times daily. 60 tablet 5 09/27/2020 at Unknown time  . rosuvastatin (CRESTOR) 20 MG tablet Take 1 tablet (20 mg total) by mouth daily. 10 tablet 0 09/27/2020 at Unknown time  . tamsulosin (FLOMAX) 0.4 MG CAPS capsule Take 0.4 mg by mouth daily.   09/27/2020 at Unknown time  . traMADol (ULTRAM) 50 MG tablet Take 50 mg by mouth every 6 (six) hours as needed for moderate pain.    Past Week at Unknown time  . lisinopril (ZESTRIL) 20 MG tablet Take 0.5 tablets (10 mg total) by mouth 2 (two) times daily. (Patient not taking: Reported on 09/27/2020) 10 tablet 0 Not Taking at Unknown time    Inpatient Medications:  . amLODipine  10 mg Oral Daily  . aspirin EC  81 mg Oral Daily  . carvedilol  12.5 mg Oral BID WC  . clopidogrel  75 mg Oral Q breakfast  . potassium chloride  40 mEq Oral BID  . rosuvastatin  20 mg Oral Daily  . sodium chloride flush  3 mL Intravenous Q12H  . tamsulosin  0.4 mg Oral Daily    Allergies:  Allergies  Allergen Reactions  . Atorvastatin Nausea And Vomiting and Other (See  Comments)    Social History   Socioeconomic History  . Marital status: Married    Spouse name: Not on file  . Number of children: Not on file  . Years of education: Not on file  . Highest education level: Not on file  Occupational History  . Not on file  Tobacco Use  . Smoking status: Former Smoker    Packs/day: 3.00    Types: Cigars    Quit date: 09/25/2020    Years since quitting: 0.0  . Smokeless tobacco: Current User  . Tobacco comment: uses [employer]  Vaping Use  . Vaping Use: Never used  Substance and Sexual Activity  . Alcohol use: Yes    Alcohol/week: 1.0 standard drink    Types: 1 Glasses of wine per week    Comment:  rare  . Drug use: No  . Sexual activity: Not on file  Other Topics Concern  . Not on file  Social History Narrative  . Not on file   Social Determinants of Health   Financial Resource Strain:   . Difficulty of Paying Living Expenses: Not on file  Food Insecurity:   . Worried About Charity fundraiser in the Last Year: Not on file  . Ran Out of Food in the Last Year: Not on file  Transportation Needs:   . Lack of Transportation (Medical): Not on file  . Lack of Transportation (Non-Medical): Not on file  Physical Activity:   . Days of Exercise per Week: Not on file  . Minutes of Exercise per Session: Not on file  Stress:   . Feeling of Stress : Not on file  Social Connections:   . Frequency of Communication with Friends and Family: Not on file  . Frequency of Social Gatherings with Friends and Family: Not on file  . Attends Religious Services: Not on file  . Active Member of Clubs or Organizations: Not on file  . Attends Archivist Meetings: Not on file  . Marital Status: Not on file  Intimate Partner Violence:   . Fear of Current or Ex-Partner: Not on file  . Emotionally Abused: Not on file  . Physically Abused: Not on file  . Sexually Abused: Not on file     Family History  Problem Relation Age of Onset  . Heart disease  Mother        s/p 4 vessel CABG x 2  . Heart disease Father        s/p CABG  . Cancer Father        metastatic prostate CA  . Cancer Brother 74       Lung     Review of Systems: All other systems reviewed and are otherwise negative except as noted above.  Physical Exam: Vitals:   09/30/20 2352 10/01/20 0421 10/01/20 0600 10/01/20 0700  BP: (!) 155/58 (!) 157/59    Pulse: 67 (!) 50 70 69  Resp: 17 17 (!) 21 17  Temp: 98.5 F (36.9 C) 98.2 F (36.8 C)    TempSrc: Oral Oral    SpO2: 96% 97% 94% 95%  Weight:  102.3 kg    Height:        GEN- The patient is well appearing, alert and oriented x 3 today.   HEENT: normocephalic, atraumatic; sclera clear, conjunctiva pink; hearing intact; oropharynx clear; neck supple Lungs- Clear to ausculation bilaterally, normal work of breathing.  No wheezes, rales, rhonchi Heart- Regular rate and rhythm, no murmurs, rubs or gallops GI- soft, non-tender, non-distended, bowel sounds present Extremities- no clubbing, cyanosis, or edema; DP/PT/radial pulses 2+ bilaterally MS- no significant deformity or atrophy Skin- warm and dry, no rash or lesion Psych- euthymic mood, full affect Neuro- strength and sensation are intact  Labs:   Lab Results  Component Value Date   WBC 11.2 (H) 09/30/2020   HGB 14.2 09/30/2020   HCT 43.9 09/30/2020   MCV 91.1 09/30/2020   PLT 162 09/30/2020    Recent Labs  Lab 10/01/20 0142  NA 142  K 3.6  CL 99  CO2 32  BUN 37*  CREATININE 1.82*  CALCIUM 8.2*  PROT 5.2*  BILITOT 1.0  ALKPHOS 73  ALT 56*  AST 41  GLUCOSE 157*      Radiology/Studies: DG Chest 2 View  Result Date:  09/29/2020 CLINICAL DATA:  Cough, pulmonary infiltrate EXAM: CHEST - 2 VIEW COMPARISON:  09/27/2020 FINDINGS: Normal heart size and pulmonary vascularity. Prominence of RIGHT hilum. Mediastinal contours otherwise normal. Peribronchial thickening with subsegmental atelectasis at RIGHT base. Scattered interstitial infiltrates RIGHT  perihilar to RIGHT base. Remaining lungs clear. No pleural effusion or pneumothorax. IMPRESSION: Persistent RIGHT perihilar/basilar infiltrate and basilar atelectasis. Electronically Signed   By: Lavonia Dana M.D.   On: 09/29/2020 08:28   DG Chest 2 View  Result Date: 09/27/2020 CLINICAL DATA:  Chest pain EXAM: CHEST - 2 VIEW COMPARISON:  01/01/2012 FINDINGS: Right perihilar infiltrate. No edema, effusion, or pneumothorax. Borderline heart size, likely accentuated by fat pad. Normal aortic contours. Rounded bony density posterior to the sternum on the lateral view is related to the ribs and chronic. IMPRESSION: Right perihilar infiltrate. Followup PA and lateral chest X-ray is recommended in 3-4 weeks to ensure resolution. Electronically Signed   By: Monte Fantasia M.D.   On: 09/27/2020 07:53   CARDIAC CATHETERIZATION  Addendum Date: 09/29/2020    Dist Cx lesion is 20% stenosed.  Prox RCA lesion is 70% stenosed.  Prox Cx lesion is 80% stenosed.  Post intervention, there is a 10% residual stenosis.  A drug-eluting stent was successfully placed using a STENT RESOLUTE ONYX 4.5X12.  Lat 1st Mrg lesion is 75% stenosed.  Mid Cx lesion is 80% stenosed.  Post intervention, there is a 0% residual stenosis.  A drug-eluting stent was successfully placed using a STENT RESOLUTE ONYX 4.0X12.  Previously placed Mid LAD stent (unknown type) is widely patent.  Mid RCA lesion is 100% stenosed.  1.  Widely patent LAD stent with no significant restenosis, chronically occluded right coronary artery with left-to-right collaterals, new 80% stenosis in proximal and mid left circumflex. 2.  Left ventricular angiography was not performed due to underlying chronic kidney disease. 3.  Right heart catheterization showed mild to moderate elevation in filling pressures, moderate pulmonary hypertension and normal cardiac output. 4.  Mild gradient across aortic valve.  Evaluating the gradient was difficult due to frequent PVCs.  5.  Successful IVUS guided drug-eluting stent placement to mid and proximal left circumflex. Recommendations: Dual antiplatelet therapy for at least 6 months. Aggressive treatment of risk factors and blood pressure control. I increase carvedilol to 12.5 mg twice daily. The patient continues to be volume overloaded and probably requires 1 more day of IV diuresis.  Monitor renal function closely.   Result Date: 09/29/2020  Dist Cx lesion is 20% stenosed.  Prox RCA lesion is 70% stenosed.  Prox Cx lesion is 80% stenosed.  Post intervention, there is a 10% residual stenosis.  A drug-eluting stent was successfully placed using a STENT RESOLUTE ONYX 4.5X12.  Lat 1st Mrg lesion is 75% stenosed.  Mid Cx lesion is 80% stenosed.  Post intervention, there is a 0% residual stenosis.  A drug-eluting stent was successfully placed using a STENT RESOLUTE ONYX 4.0X12.  Previously placed Mid LAD stent (unknown type) is widely patent.  Mid RCA lesion is 100% stenosed.  1.  Widely patent LAD stent with no significant restenosis, chronically occluded right coronary artery with left-to-right collaterals, new 80% stenosis in proximal and mid left circumflex. 2.  Left ventricular angiography was not performed due to underlying chronic kidney disease. 3.  Right heart catheterization showed mild to moderate elevation in filling pressures, moderate pulmonary hypertension and normal cardiac output. 4.  Successful IVUS guided drug-eluting stent placement to mid and proximal left circumflex. Recommendations: Dual  antiplatelet therapy for at least 6 months. Aggressive treatment of risk factors and blood pressure control. I increase carvedilol to 12.5 mg twice daily. The patient continues to be volume overloaded and probably requires 1 more day of IV diuresis.  Monitor renal function closely.   ECHOCARDIOGRAM COMPLETE  Result Date: 09/28/2020    ECHOCARDIOGRAM REPORT   Patient Name:   Chase Barton Date of Exam: 09/28/2020  Medical Rec #:  [id number]           Height:       74.0 in Accession #:    [id number]          Weight:       230.0 lb Date of Birth:  10/31/1951            BSA:          2.306 m Patient Age:    27 years            BP:           162/68 mmHg Patient Gender: M                   HR:           71 bpm. Exam Location:  Inpatient Procedure: 2D Echo, Cardiac Doppler and Color Doppler Indications:    Aortic Stenosis 424.1 / 135.0  History:        Patient has prior history of Echocardiogram examinations, most                 recent 10/02/2019. CAD, Aortic Valve Disease; Risk                 Factors:Hypertension, Dyslipidemia and Current Smoker.  Sonographer:    Vickie Epley RDCS Referring Phys: [id number] [facility] IMPRESSIONS  1. Left ventricular ejection fraction, by estimation, is 55 to 60%. The left ventricle has normal function. The left ventricle has no regional wall motion abnormalities. The left ventricular internal cavity size was moderately dilated. Left ventricular diastolic parameters are consistent with Grade II diastolic dysfunction (pseudonormalization).  2. Right ventricular systolic function is normal. The right ventricular size is normal. There is normal pulmonary artery systolic pressure.  3. Right atrial size was severely dilated.  4. The mitral valve is normal in structure. Trivial mitral valve regurgitation. No evidence of mitral stenosis.  5. The aortic valve is normal in structure. There is mild calcification of the aortic valve. There is mild thickening of the aortic valve. Aortic valve regurgitation is moderate. Mild aortic valve stenosis. Aortic valve area, by VTI measures 1.00 cm. Aortic valve mean gradient measures 14.5 mmHg. Aortic valve Vmax measures 2.47 m/s.  6. The inferior vena cava is dilated in size with <50% respiratory variability, suggesting right atrial pressure of 15 mmHg. FINDINGS  Left Ventricle: Left ventricular ejection fraction, by estimation, is 55 to 60%. The left ventricle  has normal function. The left ventricle has no regional wall motion abnormalities. The left ventricular internal cavity size was moderately dilated. There is no left ventricular hypertrophy. Left ventricular diastolic parameters are consistent with Grade II diastolic dysfunction (pseudonormalization). Right Ventricle: The right ventricular size is normal. No increase in right ventricular wall thickness. Right ventricular systolic function is normal. There is normal pulmonary artery systolic pressure. The tricuspid regurgitant velocity is 1.73 m/s, and  with an assumed right atrial pressure of 15 mmHg, the estimated right ventricular systolic pressure is 60.6 mmHg. Left Atrium: Left atrial size was normal  in size. Right Atrium: Right atrial size was severely dilated. Pericardium: There is no evidence of pericardial effusion. Mitral Valve: The mitral valve is normal in structure. Trivial mitral valve regurgitation. No evidence of mitral valve stenosis. Tricuspid Valve: The tricuspid valve is normal in structure. Tricuspid valve regurgitation is trivial. No evidence of tricuspid stenosis. Aortic Valve: The aortic valve is normal in structure. There is mild calcification of the aortic valve. There is mild thickening of the aortic valve. Aortic valve regurgitation is moderate. Mild aortic stenosis is present. Aortic valve mean gradient measures 14.5 mmHg. Aortic valve peak gradient measures 24.4 mmHg. Aortic valve area, by VTI measures 1.00 cm. Pulmonic Valve: The pulmonic valve was normal in structure. Pulmonic valve regurgitation is not visualized. No evidence of pulmonic stenosis. Aorta: The aortic root is normal in size and structure. Venous: The inferior vena cava is dilated in size with less than 50% respiratory variability, suggesting right atrial pressure of 15 mmHg. IAS/Shunts: No atrial level shunt detected by color flow Doppler.  LEFT VENTRICLE PLAX 2D LVIDd:         6.40 cm LVIDs:         4.20 cm LV PW:          1.00 cm LV IVS:        1.00 cm LVOT diam:     2.00 cm LV SV:         51 LV SV Index:   22 LVOT Area:     3.14 cm  LV Volumes (MOD) LV vol d, MOD A2C: 184.0 ml LV vol d, MOD A4C: 110.0 ml LV vol s, MOD A2C: 75.4 ml LV vol s, MOD A4C: 49.9 ml LV SV MOD A2C:     108.6 ml LV SV MOD A4C:     110.0 ml LV SV MOD BP:      93.6 ml RIGHT VENTRICLE TAPSE (M-mode): 2.2 cm LEFT ATRIUM             Index       RIGHT ATRIUM           Index LA diam:        4.80 cm 2.08 cm/m  RA Area:     30.90 cm LA Vol (A2C):   68.4 ml 29.66 ml/m RA Volume:   113.00 ml 48.99 ml/m LA Vol (A4C):   51.0 ml 22.11 ml/m LA Biplane Vol: 62.9 ml 27.27 ml/m  AORTIC VALVE AV Area (Vmax):    0.95 cm AV Area (Vmean):   0.95 cm AV Area (VTI):     1.00 cm AV Vmax:           247.00 cm/s AV Vmean:          165.500 cm/s AV VTI:            0.512 m AV Peak Grad:      24.4 mmHg AV Mean Grad:      14.5 mmHg LVOT Vmax:         74.40 cm/s LVOT Vmean:        50.300 cm/s LVOT VTI:          0.162 m LVOT/AV VTI ratio: 0.32  AORTA Ao Root diam: 3.30 cm TRICUSPID VALVE TR Peak grad:   12.0 mmHg TR Vmax:        173.00 cm/s  SHUNTS Systemic VTI:  0.16 m Systemic Diam: 2.00 cm Skeet Latch MD Electronically signed by Skeet Latch MD Signature Date/Time: 09/28/2020/11:56:52 AM  Final     EKG: NSR at 65 bpm with PVCs (inferior septal) in pattern of bigeminy  (personally reviewed)  TELEMETRY: Bigeminy with HRs in 70s, occasional NSVT (personally reviewed)  Assessment/Plan: 1.  Very frequent PVCs, Bigeminy Discussed potential for PVC induced cardiomyopathy and that we recommended suppression with medication.  Discuss risks of amiodarone and the need for monitoring of vision, thyroid function, liver enzymes, and lung function. Pt verbalizes understanding and agrees to amiodarone use with close EP follow up. Will start on 400 meq BID with planned titration, Will discuss further with Dr. Quentin Ore.   2. CAD Previous LAD stenting, s/p proximal and mid LCx  DES this admission Doing well currently. DAPT, BB, and statin per primary.   3. Acute on chronic diastolic CHF RHC with elevated PA pressures. Diuresing with IV lasix.   4. HTN, poor control Renal artery duplex this am.  Meds per primary  5. CKD III Baseline Cr ~ 1.6. Continue to monitor with diuresis.   Will plan on Amiodarone 400 mg BID x 5 days, then Amiodarone 400 mg daily x 5 days, then amiodarone 200 mg daily.    For questions or updates, please contact [place] Please consult [email or web address] for contact info under Cardiology/STEMI.  Signed, Shirley Friar, PA-C  10/01/2020 8:25 AM   ------------------------------------------------------------------------------------------------- I have seen, examined the patient, and reviewed the above assessment and plan.    69yo man with CAD s/p LCx PCI this admission now with bigeminal PVCs. PVC appear to be originating from the inferior septum, possibly a PMPM focus. Ectopy likely related to ischemic disease. Will plan to suppress temporarily with amiodarone (renal function, CAD limit other options). I discussed the risks/benefits of amiodarone therapy and he is agreeable to proceed.  I will plan to stop the amiodarone after 3 months.  Vickie Epley, MD 10/01/2020 5:30 PM

## 2020-10-01 NOTE — Progress Notes (Signed)
Renal artery duplex completed. Refer to "CV Proc" under chart review to view preliminary results.  10/01/2020 10:28 AM Kelby Aline., MHA, RVT, RDCS, RDMS

## 2020-10-01 NOTE — Progress Notes (Signed)
CARDIAC REHAB PHASE I   PRE:  Rate/Rhythm: 66 SR bigeminy    BP: sitting 172/72    SaO2: 94 RA  MODE:  Ambulation: 800 ft   POST:  Rate/Rhythm: 77 SR bigeminy    BP: sitting 178/74     SaO2: 93 RA  Pt without major c/o. Sts he doesn't feel perfect yet but better than PTA. Constant bigeminy during walk and x2 runs of NSVT after walk at rest, 4-5 beats. Pt asx. Daughter sts pt had to be put on O2 last night for low SaO2. SaO2 maintained in low 90s during walk except for times of poor pulsility. No questions regarding education. Pt is eager to have issues resolved. [place], ACSM 10/01/2020 9:08 AM

## 2020-10-04 ENCOUNTER — Telehealth: Payer: Self-pay | Admitting: Cardiovascular Disease

## 2020-10-04 NOTE — Telephone Encounter (Signed)
Patient quit smoking since recent admission and is needing some assistance with anxiety.   Per daughter she thinks Buspar or Zoloft would help him at this time and they have been unable to get in touch with or get a call back from the [place] pcp office.

## 2020-10-05 MED ORDER — SERTRALINE HCL 50 MG PO TABS: 50.0000 mg | ORAL_TABLET | Freq: Every day | ORAL | 2 refills | Status: AC

## 2020-10-05 NOTE — Telephone Encounter (Signed)
I recommend adding sertraline (Zoloft) 50 mg once daily.  It will probably take at least 1 to 2 weeks to notice a difference.

## 2020-10-05 NOTE — Telephone Encounter (Signed)
Returned the patients call. Patient rqst that I speak with his daughter Chase Barton. Tarri Glenn of [name] response and recommendation. Rx for sertraline 50 mg qd #30 R-2 has been sent to the patients pharmacy.  Tarri Glenn that future refill rqst will need to be sent to the patients pcp.  Chase Barton verbalized understanding and voiced appreciation for the assistance.

## 2020-10-05 NOTE — Telephone Encounter (Signed)
Will route to [name] to advise.

## 2020-10-06 ENCOUNTER — Other Ambulatory Visit: Payer: Self-pay | Admitting: Emergency Medicine

## 2020-10-06 DIAGNOSIS — Z79899 Other long term (current) drug therapy: Secondary | ICD-10-CM | POA: Diagnosis not present

## 2020-10-07 LAB — BASIC METABOLIC PANEL
BUN/Creatinine Ratio: 27 — ABNORMAL HIGH (ref 10–24)
BUN: 40 mg/dL — ABNORMAL HIGH (ref 8–27)
CO2: 38 mmol/L — ABNORMAL HIGH (ref 20–29)
Calcium: 8.9 mg/dL (ref 8.6–10.2)
Chloride: 87 mmol/L — ABNORMAL LOW (ref 96–106)
Creatinine, Ser: 1.5 mg/dL — ABNORMAL HIGH (ref 0.76–1.27)
GFR calc Af Amer: 54 mL/min/{1.73_m2} — ABNORMAL LOW (ref >59)
GFR calc non Af Amer: 46 mL/min/{1.73_m2} — ABNORMAL LOW (ref >59)
Glucose: 117 mg/dL — ABNORMAL HIGH (ref 65–99)
Potassium: 2.6 mmol/L — ABNORMAL LOW (ref 3.5–5.2)
Sodium: 141 mmol/L (ref 134–144)

## 2020-10-08 ENCOUNTER — Telehealth: Payer: Self-pay | Admitting: Cardiovascular Disease

## 2020-10-08 MED ORDER — FUROSEMIDE 80 MG PO TABS: 40.0000 mg | ORAL_TABLET | Freq: Every day | ORAL | 11 refills | Status: DC

## 2020-10-08 NOTE — Telephone Encounter (Signed)
Chase Barton notified and verbalized understanding to decrease furosemide to 40 mg daily. She is a Marine scientist and will monitor for signs of dehydration.

## 2020-10-08 NOTE — Telephone Encounter (Signed)
Nurse Raquel Sarna calling  Needs to discuss patient's weight and lasix dosing  Transferred to [facility]

## 2020-10-08 NOTE — Telephone Encounter (Signed)
The nurse calling was patient's daughter, Chase Barton, ok per DPR. Patient discharged weight was 225 lb on 10/01/20. He was told to take furosemide 40 mg two times a day, which he has been doing. Today he was 217 lb.  Patient reports feeling great, no wet cough, no PVCs, and is able to lay flat with no problem. Patient does not want to continue taking the furosemide 40 mg BID because he says his dry weight is 225 lb.  Daughter is encouraging patient to continue the furosemide because that's what he was discharged on and is trying to help patient understand that his current weight may be more appropriately his dry weight. She was able to get patient to take furosemide 40 mg this morning and would like to know if it is ok to keep furosemide at 40 mg daily and take and extra if he gains weight from his current 217 lb.  Routing to Dr Fletcher Anon to make sure that is ok until appointment with Laurann [place], NP on 10/13/20.

## 2020-10-08 NOTE — Telephone Encounter (Signed)
I agree with decreasing furosemide to 40 mg once daily.  We do not want him to get dehydrated.

## 2020-10-11 ENCOUNTER — Telehealth: Payer: Self-pay | Admitting: Cardiovascular Disease

## 2020-10-11 ENCOUNTER — Telehealth (HOSPITAL_COMMUNITY): Payer: Self-pay

## 2020-10-11 NOTE — Telephone Encounter (Signed)
Patient daughter calling to check on status  Would like to know if we can order oxygen for him - wants to avoid another ER visit Please call to discuss

## 2020-10-11 NOTE — Telephone Encounter (Signed)
Spoke with daughter and she states that her oxygen desats down to 84% when ambulation and will recover quickly. At night when sleeping he is in the low 80% range. Patient is in NO distress with NO other symptoms at this time. He does not need to go to ED for this per his daughter who is a Marine scientist. Patient only needs oxygen at this time until he can see his provider. Advised that I would send over to [name] and also send to his primary care provider. She was appreciative for the call with no further questions at this time.

## 2020-10-11 NOTE — Telephone Encounter (Signed)
Pt c/o Shortness Of Breath: STAT if SOB developed within the last 24 hours or pt is noticeably SOB on the phone  1. Are you currently SOB (can you hear that pt is SOB on the phone)? yes  2. How long have you been experiencing SOB? Throughout the night  3. Are you SOB when sitting or when up moving around? Both, O2 level staying at 90 while sitting  4. Are you currently experiencing any other symptoms? Low O2 levels (in the 80's)

## 2020-10-11 NOTE — Telephone Encounter (Signed)
Faxed to [facility].

## 2020-10-12 ENCOUNTER — Other Ambulatory Visit (HOSPITAL_COMMUNITY): Payer: Medicare Other

## 2020-10-13 ENCOUNTER — Ambulatory Visit (INDEPENDENT_AMBULATORY_CARE_PROVIDER_SITE_OTHER): Payer: Medicare Other | Admitting: Family

## 2020-10-13 ENCOUNTER — Other Ambulatory Visit: Payer: Self-pay

## 2020-10-13 ENCOUNTER — Encounter: Payer: Self-pay | Admitting: Family

## 2020-10-13 VITALS — BP 168/64 | HR 63 | Ht 74.0 in | Wt 213.2 lb

## 2020-10-13 DIAGNOSIS — I350 Nonrheumatic aortic (valve) stenosis: Secondary | ICD-10-CM

## 2020-10-13 DIAGNOSIS — I10 Essential (primary) hypertension: Secondary | ICD-10-CM | POA: Diagnosis not present

## 2020-10-13 DIAGNOSIS — E785 Hyperlipidemia, unspecified: Secondary | ICD-10-CM | POA: Diagnosis not present

## 2020-10-13 DIAGNOSIS — R0602 Shortness of breath: Secondary | ICD-10-CM | POA: Diagnosis not present

## 2020-10-13 DIAGNOSIS — I493 Ventricular premature depolarization: Secondary | ICD-10-CM

## 2020-10-13 DIAGNOSIS — I25118 Atherosclerotic heart disease of native coronary artery with other forms of angina pectoris: Secondary | ICD-10-CM

## 2020-10-13 MED ORDER — ALBUTEROL SULFATE HFA 108 (90 BASE) MCG/ACT IN AERS: 2.0000 | INHALATION_SPRAY | Freq: Four times a day (QID) | RESPIRATORY_TRACT | 2 refills | Status: AC | PRN

## 2020-10-13 NOTE — Progress Notes (Signed)
Office Visit    Patient Name: Chase Barton Date of Encounter: 10/13/2020  Primary Care Provider:  Allean Found, PA-C Primary Cardiologist:  [name], MD Electrophysiologist:  None   Chief Complaint    Chase Barton is a 69 y.o. male with a hx of CAD with known CTO of RCA and prior stenting to LAD 04/2012 and DES to LCx 09/2020, moderate aortic stenosis, paroxysmal SVT s/p ablation, HTN, HLD, CKD 3, tobacco use, chronic diastolic heart failure  presents today for follow-up after cardiac catheterization and hospitalization  Past Medical History    Past Medical History:  Diagnosis Date  . Aortic stenosis 04/2012   mild-moderate  . Chronic kidney disease    Stage III  . Coronary artery disease 04/2012   Cardiac cath 04/2012: LAD, 90% proximal, RCA: 100% mid with left to right collaterals, Normal EF, Moderate aortic stenosis (peak -peak gradient of 23 mm Hg), LAD PCI with a DES placment.   . Diverticulitis   . Heart murmur   . Hyperlipidemia   . Hypertension 06/20/2012  . PSVT (paroxysmal supraventricular tachycardia) ([place]) 2018   Status post ablation in March 2018 in [place].  . Substance abuse ([place])    remote  . Tobacco abuse    Past Surgical History:  Procedure Laterality Date  . CARDIAC CATHETERIZATION     x1 stent  . CARDIAC CATHETERIZATION    . CARDIAC CATHETERIZATION    . CARDIAC CATHETERIZATION N/A 01/05/2016   Procedure: Right/Left Heart Cath and Coronary Angiography;  Surgeon: Wellington Hampshire, MD;  Location: [facility] [place];  Service: Cardiovascular;  Laterality: N/A;  . CARDIAC ELECTROPHYSIOLOGY STUDY AND ABLATION    . CORONARY STENT INTERVENTION N/A 09/29/2020   Procedure: CORONARY STENT INTERVENTION;  Surgeon: Wellington Hampshire, MD;  Location: [place] [facility];  Service: Cardiovascular;  Laterality: N/A;  . INTRAVASCULAR ULTRASOUND/IVUS N/A 09/29/2020   Procedure: Intravascular Ultrasound/IVUS;  Surgeon: Wellington Hampshire, MD;   Location: [place] [facility];  Service: Cardiovascular;  Laterality: N/A;  . RIGHT/LEFT HEART CATH AND CORONARY ANGIOGRAPHY N/A 09/29/2020   Procedure: RIGHT/LEFT HEART CATH AND CORONARY ANGIOGRAPHY;  Surgeon: Wellington Hampshire, MD;  Location: [facility];  Service: Cardiovascular;  Laterality: N/A;    Allergies  Allergies  Allergen Reactions  . Atorvastatin Nausea And Vomiting and Other (See Comments)    History of Present Illness    Chase Barton is a 69 y.o. male with a hx of CAD with known CTO of RCA and prior stenting to LAD 04/2012 and DES to LCx 09/2020, moderate aortic stenosis, paroxysmal SVT s/p ablation, HTN, HLD, CKD 3, tobacco use, chronic diastolic heart failure last seen while hospitalized.  Previous cardiac catheterization January 2017 with two-vessel disease and widely patent proximal LAD stent and CTO of RCA with left-to-right collaterals.  Moderate aortic stenosis with mean gradient 27 mmHg and valve area 0.95 cm.  Echo 09/2019 with LVEF 60 to 65%, moderate diastolic dysfunction, moderate aortic stenosis.  He was admitted to the hospital 09/27/2020 after presenting with increased exertional limitation.  Prior to that he had been seen in the ED in [place] and treated with 1 dose of Lasix for CHF and was discharged.  He presented to [facility] for further evaluation as he did not notice improvement in his symptoms.  HS-troponin 25 ? 21, BNP 605, creatinine 1.65, CXR R perihilar infiltrate.  Echo 09/28/2020 LVEF 55-60%, no RWMA, LV moderately dilated,gr2DD,  RV normal size and function, RA severely dilated, trivial MR, mild aortic stenosis  (VTI 1.0 cm, mean gradient 14.5 mmHg ). underwent R/LHC due to unstable angina. [facility] 09/29/2020 with [name] with widely patent LAD stent with no significant restenosis, CTO of RCA with left-to-right collaterals, new 80% stenosis in proximal and mid LCx.  LV angiography not performed due to underlying CKD.  RHC with mild to moderate  elevation of filling pressures, moderate pulmonary hypertension, normal cardiac output.  Mild gradient across aortic valve though difficult due to frequent PVCs.  DES was placed to proximal-mid circumflex.  Was recommended for DAPT for at least 6 months, carvedilol increased to 12.5 mg twice daily. Renal duplex 10/01/2020 with bilateral less than 60% stenosis.  Per Dr. Ellyn Hack renal artery stenosis not cause for high blood pressure.  He was seen by EP during admission due to frequent ventricular bigeminy-he was started on amiodarone.  Called office 10/04/20 noting anxiety since quiting smoking. He was given 1 month supply of Zoloft 50mg  daily with 2 refills by [name] with further refills to come from PCP office. Called the office 10/08/20 noting weight 217 lbs and was below dry weight. His Lasix was changed to 40mg  daily.    Presents today with follow-up for his daughter.  His daughter works as a Emergency planning/management officer.  Notes that she or his other daughter have been staying with him but both are planning to return to work.  Reports low O2 readings with ambulation as low as 88 and some hypoxia with O2 levels as low as 85% while sleeping.  Never had sleep study nor overnight oximetry.  Denies orthopnea, PND.  Does sleep on 3 pillows but this is chronic.  Notices slight hallucinations when laying down at night. Tells me he "feels things" like he is holding something and then lets go of it.  Unclear whether this is true hallucination versus possibly some numbness or tingling.  Reports no lower extremity edema.  Has been very careful about eating a low-salt diet.  His weight continues to trend down at home.  Throughout the day he drinks 3-4 sixteen ounce bottles of water, 12 oz caffeine free soda, and maybe one eight oz cup of coffee.   He is transitioning his primary care to Dr. Henrene Pastor with the [facility]. He is interested in getting all of his care through the [place] at [place].   EKGs/Labs/Other Studies Reviewed:    The following studies were reviewed today:  Renal ultrasound 10/01/2020 Right: 1-59% stenosis of the right renal artery. Cyst(s) noted. RRV         flow present.  Left:  1-59% stenosis of the left renal artery. LRV flow present.  Mesenteric:  Normal Celiac artery findings.   [facility] 09/29/2020  Dist Cx lesion is 20% stenosed.  Prox RCA lesion is 70% stenosed.  Prox Cx lesion is 80% stenosed.  Post intervention, there is a 10% residual stenosis.  A drug-eluting stent was successfully placed using a STENT RESOLUTE ONYX 4.5X12.  Lat 1st Mrg lesion is 75% stenosed.  Mid Cx lesion is 80% stenosed.  Post intervention, there is a 0% residual stenosis.  A drug-eluting stent was successfully placed using a STENT RESOLUTE ONYX 4.0X12.  Previously placed Mid LAD stent (unknown type) is widely patent.  Mid RCA lesion is 100% stenosed.   1.  Widely patent LAD stent with no significant restenosis, chronically occluded right coronary artery with left-to-right collaterals, new 80% stenosis in proximal and mid left  circumflex. 2.  Left ventricular angiography was not performed due to underlying chronic kidney disease. 3.  Right heart catheterization showed mild to moderate elevation in filling pressures, moderate pulmonary hypertension and normal cardiac output. 4.  Mild gradient across aortic valve.  Evaluating the gradient was difficult due to frequent PVCs. 5.  Successful IVUS guided drug-eluting stent placement to mid and proximal left circumflex.   Recommendations: Dual antiplatelet therapy for at least 6 months. Aggressive treatment of risk factors and blood pressure control. I increase carvedilol to 12.5 mg twice daily. The patient continues to be volume overloaded and probably requires 1 more day of IV diuresis.  Monitor renal function closely. Diagnostic Dominance: Co-dominant  Intervention      Echo: 09/28/20  IMPRESSIONS   1. Left ventricular ejection fraction, by  estimation, is 55 to 60%. The  left ventricle has normal function. The left ventricle has no regional  wall motion abnormalities. The left ventricular internal cavity size was  moderately dilated. Left ventricular  diastolic parameters are consistent with Grade II diastolic dysfunction  (pseudonormalization).   2. Right ventricular systolic function is normal. The right ventricular  size is normal. There is normal pulmonary artery systolic pressure.   3. Right atrial size was severely dilated.   4. The mitral valve is normal in structure. Trivial mitral valve  regurgitation. No evidence of mitral stenosis.   5. The aortic valve is normal in structure. There is mild calcification  of the aortic valve. There is mild thickening of the aortic valve. Aortic  valve regurgitation is moderate. Mild aortic valve stenosis. Aortic valve  area, by VTI measures 1.00 cm.  Aortic valve mean gradient measures 14.5 mmHg. Aortic valve Vmax measures  2.47 m/s.   6. The inferior vena cava is dilated in size with <50% respiratory  variability, suggesting right atrial pressure of 15 mmHg.   EKG:  EKG is  ordered today.  The ekg ordered today demonstrates SR 63 bpm with lateral TWI improved compared to prior. No acute ST/T wave changes. Prolonged QT with QT 518 and QTc 530.  Recent Labs: 09/27/2020: B Natriuretic Peptide 605.4; Magnesium 2.1 09/28/2020: TSH 1.057 09/30/2020: Hemoglobin 14.2; Platelets 162 10/01/2020: ALT 56 10/06/2020: BUN 40; Creatinine, Ser 1.50; Potassium 2.6; Sodium 141  Recent Lipid Panel    Component Value Date/Time   CHOL 113 09/28/2020 0521   CHOL 148 07/31/2018 0812   TRIG 78 09/28/2020 0521   HDL 53 09/28/2020 0521   HDL 45 07/31/2018 0812   CHOLHDL 2.1 09/28/2020 0521   VLDL 16 09/28/2020 0521   LDLCALC 44 09/28/2020 0521   LDLCALC 78 07/31/2018 0812    Home Medications   Current Meds  Medication Sig  . acetaminophen (TYLENOL) 500 MG tablet Take 1,000 mg by mouth  daily.  Marland Kitchen amiodarone (PACERONE) 200 MG tablet Take 400mg  (2tabs) twice a day for 5 days, then 400mg  daily for 5 days, then 200mg  daily  . amLODipine (NORVASC) 10 MG tablet Take 1 tablet (10 mg total) by mouth daily.  Marland Kitchen ascorbic acid (VITAMIN C) 500 MG tablet Take 500 mg by mouth daily.   Marland Kitchen aspirin EC 81 MG tablet Take 81 mg by mouth daily.  . carvedilol (COREG) 12.5 MG tablet Take 1 tablet (12.5 mg total) by mouth 2 (two) times daily with a meal.  . clopidogrel (PLAVIX) 75 MG tablet Take 1 tablet (75 mg total) by mouth daily with breakfast.  . furosemide (LASIX) 80 MG tablet Take 0.5 tablets (40  mg total) by mouth daily.  Marland Kitchen latanoprost (XALATAN) 0.005 % ophthalmic solution Place 1 drop into both eyes daily.   . nitroGLYCERIN (NITROSTAT) 0.4 MG SL tablet Place 0.4 mg under the tongue every 5 (five) minutes as needed for chest pain.  . ranolazine (RANEXA) 1000 MG SR tablet Take 1 tablet (1,000 mg total) by mouth 2 (two) times daily.  . rosuvastatin (CRESTOR) 20 MG tablet Take 1 tablet (20 mg total) by mouth daily.  . sertraline (ZOLOFT) 50 MG tablet Take 1 tablet (50 mg total) by mouth daily.  . tamsulosin (FLOMAX) 0.4 MG CAPS capsule Take 0.4 mg by mouth daily.  . traMADol (ULTRAM) 50 MG tablet Take 50 mg by mouth every 6 (six) hours as needed for moderate pain.      Review of Systems  All other systems reviewed and are otherwise negative except as noted above.  Physical Exam    VS:  BP (!) 168/64 (BP Location: Left Arm, Patient Position: Sitting, Cuff Size: Normal)   Pulse 63   Ht 6\' 2"  (1.88 m)   Wt 213 lb 4 oz (96.7 kg)   SpO2 94%   BMI 27.38 kg/m  , BMI Body mass index is 27.38 kg/m.  Wt Readings from Last 3 Encounters:  10/13/20 213 lb 4 oz (96.7 kg)  10/01/20 225 lb 8.5 oz (102.3 kg)  10/02/19 226 lb (102.5 kg)    GEN: Well nourished, well developed, in no acute distress. HEENT: normal. Neck: Supple, no JVD, carotid bruits, or masses. Cardiac: RRR, no murmurs, rubs, or  gallops. No clubbing, cyanosis, edema.  Radials/DP/PT 2+ and equal bilaterally.  Respiratory:  Respirations regular and unlabored, clear to auscultation bilaterally. GI: Soft, nontender, nondistended. MS: No deformity or atrophy. Skin: Warm and dry, no rash. Neuro:  Strength and sensation are intact. Psych: Normal affect.  Assessment & Plan    1. CAD-09/29/20 s/p DES X 2 (proximal LCx &  mid LCx).  No recurrent anginal symptoms since discharge.  EKG today shows sinus rhythm with improvement in lateral T wave inversions compared to previous.  GDMT includes DAPT for at least 6 months from 09/29/2020 of aspirin and Plavix.  Denies bleeding complications.  CBC today.  Additional GDMT includes carvedilol 12.5 mg twice daily, Ranexa 1000 mg twice daily.  2. Hypokalemia -10/06/20 K 2.6. Repeat BMP today and magnesium.  Not presently on potassium supplement in the setting of CKD.  His Lasix has been previously reduced and due to continued weight loss and euvolemic status we may be able to further reduce pending lab results.  3. Hypoxia -notes hypoxia with activity down to 88%.  Encourage participate in cardiac rehab.  He will discuss possibility of physical therapy with his new primary care provider on Friday.  Daughter who is a home health RN also notices hypoxia at night.  No previous overnight pulse oximetry nor sleep study. Rx Albuterol inhaler.  Instructed her that we are not able to provide overnight oxygen.  If he is not able to schedule overnight pulse oximetry or sleep study with [employer] provider, he will contact her office and we can refer to pulmonology.  4. PVC and pattern of bigeminy on amiodarone therapy -following with EP Dr. Quentin Ore.  Taking amiodarone 200 mg daily.  Upcoming follow-up 11/05/2020.  No signs of toxicity.  Reports continued palpitations particular when lying down at night.  Will defer escalation of Coreg dose due to heart rate at home routinely in the 60s.  5. Diastolic heart  failure-mildly dry on exam.  He continues to lose weight.  Consider reduced dose of Lasix 20 mg daily though will await lab results prior to decreasing dose.  Continue low-sodium, healthy diet.  Continue less than 2 L fluid restriction.  6. Mild aortic stenosis-by echo 09/28/2020.  Consider repeat echo in 1 year for monitoring.  Continue optimal BP and volume control.  7. HTN-BP elevated today though notably well controlled at home.  Continue to monitor.  Continue present antihypertensive regimen.  8. HLD, LDL goal less than 70-09/2020 LDL 44.  Continue Crestor 20 mg daily.  9. CKD 3 -no ACE/ARB/Arni per nephrology.  Careful titration of diuretics and antihypertensive.  Avoid nephrotoxic agents.  Continue to follow with nephrology.  10. Prolonged QT -noted by EKG today.  Check electrolytes including magnesium.  If electrolytes are normal may need to consider changing his dose of Ranexa.  He has been on 1000 mg daily for some time though so likely exacerbated by electrolytes.  Disposition: Follow up 11/05/2020 with Dr. Quentin Ore of EP as previously scheduled.  Follow-up in 2 month(s) with [name] or APP  *NOTE FAXED TO [facility] PROVIDER VIA Epic FAX FUNCTION* (Dr. James Ivanoff - fax [phone])  Signed, Loel Dubonnet, NP 10/13/2020, 4:46 PM [place]

## 2020-10-13 NOTE — Patient Instructions (Addendum)
Medication Instructions:  Your physician has recommended you make the following change in your medication:   START Albuterol as needed for shortness of breath.   *If you need a refill on your cardiac medications before your next appointment, please call your pharmacy*   Lab Work: Your physician recommends that you return for lab work today: BMP, magnesium, CBC  If you have labs (blood work) drawn today and your tests are completely normal, you will receive your results only by: Marland Kitchen MyChart Message (if you have MyChart) OR . A paper copy in the mail If you have any lab test that is abnormal or we need to change your treatment, we will call you to review the results.   Testing/Procedures: Your EKG today was stable.  Follow-Up: At [facility], you and your health needs are our priority.  As part of our continuing mission to provide you with exceptional heart care, we have created designated Provider Care Teams.  These Care Teams include your primary Cardiologist (physician) and Advanced Practice Providers (APPs -  Physician Assistants and Nurse Practitioners) who all work together to provide you with the care you need, when you need it.  We recommend signing up for the patient portal called "MyChart".  Sign up information is provided on this After Visit Summary.  MyChart is used to connect with patients for Virtual Visits (Telemedicine).  Patients are able to view lab/test results, encounter notes, upcoming appointments, etc.  Non-urgent messages can be sent to your provider as well.   To learn more about what you can do with MyChart, go to [email or web address].    Your next appointment:   As previously scheduled with Dr. Quentin Ore   In 2 months with [name] or APP   Other Instructions  If you gain 2 pounds overnight or 5 pounds in one week, call our office as you will likely need more fluid pill.

## 2020-10-14 ENCOUNTER — Encounter: Payer: Self-pay | Admitting: Family

## 2020-10-14 ENCOUNTER — Telehealth: Payer: Self-pay | Admitting: Family

## 2020-10-14 DIAGNOSIS — E876 Hypokalemia: Secondary | ICD-10-CM

## 2020-10-14 LAB — CBC
Hematocrit: 44.1 % (ref 37.5–51.0)
Hemoglobin: 15.4 g/dL (ref 13.0–17.7)
MCH: 29.8 pg (ref 26.6–33.0)
MCHC: 34.9 g/dL (ref 31.5–35.7)
MCV: 85 fL (ref 79–97)
Platelets: 131 10*3/uL — ABNORMAL LOW (ref 150–450)
RBC: 5.17 x10E6/uL (ref 4.14–5.80)
RDW: 13.3 % (ref 11.6–15.4)
WBC: 14 10*3/uL — ABNORMAL HIGH (ref 3.4–10.8)

## 2020-10-14 LAB — BASIC METABOLIC PANEL
BUN/Creatinine Ratio: 20 (ref 10–24)
BUN: 41 mg/dL — ABNORMAL HIGH (ref 8–27)
CO2: 45 mmol/L (ref 20–29)
Calcium: 9 mg/dL (ref 8.6–10.2)
Chloride: 79 mmol/L — ABNORMAL LOW (ref 96–106)
Creatinine, Ser: 2.03 mg/dL — ABNORMAL HIGH (ref 0.76–1.27)
GFR calc Af Amer: 37 mL/min/{1.73_m2} — ABNORMAL LOW (ref >59)
GFR calc non Af Amer: 32 mL/min/{1.73_m2} — ABNORMAL LOW (ref >59)
Glucose: 112 mg/dL — ABNORMAL HIGH (ref 65–99)
Potassium: 2.1 mmol/L — CL (ref 3.5–5.2)
Sodium: 141 mmol/L (ref 134–144)

## 2020-10-14 LAB — MAGNESIUM: Magnesium: 2.2 mg/dL (ref 1.6–2.3)

## 2020-10-14 MED ORDER — POTASSIUM CHLORIDE CRYS ER 20 MEQ PO TBCR: EXTENDED_RELEASE_TABLET | ORAL | 0 refills | Status: DC

## 2020-10-14 NOTE — Telephone Encounter (Signed)
A BMP order was also faxed to the [facility] in [place] at [phone]. Confirmation received.

## 2020-10-14 NOTE — Telephone Encounter (Signed)
Critical lab result received from [facility] potassium 2.1.  Discussed lab result with patient's daughter per DPR via phone.  Instructed to stop Lasix.  Instructed to take potassium 40 mEq 3 times today.  Instructed to have repeat BMP drawn tomorrow.  He will be in [place] for a [place] appointment so he will have it done at the [facility] in [place].  [address] [place], [place] [address]  MON-FRI 7:00A-5:00P Phone [phone] Fax [phone]   Loel Dubonnet, NP

## 2020-10-15 ENCOUNTER — Emergency Department (HOSPITAL_COMMUNITY): Payer: No Typology Code available for payment source

## 2020-10-15 ENCOUNTER — Inpatient Hospital Stay (HOSPITAL_COMMUNITY)
Admission: EM | Admit: 2020-10-15 | Discharge: 2020-10-19 | DRG: 987 | Disposition: A | Discharge status: Home Health | Payer: No Typology Code available for payment source | Source: Ambulatory Visit | Attending: Internal Medicine | Admitting: Internal Medicine

## 2020-10-15 ENCOUNTER — Telehealth: Payer: Self-pay | Admitting: Medical

## 2020-10-15 ENCOUNTER — Encounter (HOSPITAL_COMMUNITY): Payer: Self-pay | Admitting: Emergency Medicine

## 2020-10-15 ENCOUNTER — Other Ambulatory Visit: Payer: Self-pay

## 2020-10-15 DIAGNOSIS — Z888 Allergy status to other drugs, medicaments and biological substances status: Secondary | ICD-10-CM

## 2020-10-15 DIAGNOSIS — Z8249 Family history of ischemic heart disease and other diseases of the circulatory system: Secondary | ICD-10-CM

## 2020-10-15 DIAGNOSIS — J440 Chronic obstructive pulmonary disease with acute lower respiratory infection: Secondary | ICD-10-CM | POA: Diagnosis present

## 2020-10-15 DIAGNOSIS — Z7982 Long term (current) use of aspirin: Secondary | ICD-10-CM

## 2020-10-15 DIAGNOSIS — E869 Volume depletion, unspecified: Secondary | ICD-10-CM | POA: Diagnosis present

## 2020-10-15 DIAGNOSIS — E876 Hypokalemia: Secondary | ICD-10-CM | POA: Diagnosis not present

## 2020-10-15 DIAGNOSIS — Z801 Family history of malignant neoplasm of trachea, bronchus and lung: Secondary | ICD-10-CM

## 2020-10-15 DIAGNOSIS — I5032 Chronic diastolic (congestive) heart failure: Secondary | ICD-10-CM | POA: Diagnosis present

## 2020-10-15 DIAGNOSIS — C773 Secondary and unspecified malignant neoplasm of axilla and upper limb lymph nodes: Secondary | ICD-10-CM | POA: Diagnosis present

## 2020-10-15 DIAGNOSIS — C7951 Secondary malignant neoplasm of bone: Secondary | ICD-10-CM | POA: Diagnosis present

## 2020-10-15 DIAGNOSIS — Z79899 Other long term (current) drug therapy: Secondary | ICD-10-CM

## 2020-10-15 DIAGNOSIS — J189 Pneumonia, unspecified organism: Secondary | ICD-10-CM | POA: Diagnosis present

## 2020-10-15 DIAGNOSIS — F17290 Nicotine dependence, other tobacco product, uncomplicated: Secondary | ICD-10-CM | POA: Diagnosis present

## 2020-10-15 DIAGNOSIS — E782 Mixed hyperlipidemia: Secondary | ICD-10-CM | POA: Diagnosis present

## 2020-10-15 DIAGNOSIS — R918 Other nonspecific abnormal finding of lung field: Secondary | ICD-10-CM

## 2020-10-15 DIAGNOSIS — K759 Inflammatory liver disease, unspecified: Secondary | ICD-10-CM

## 2020-10-15 DIAGNOSIS — J449 Chronic obstructive pulmonary disease, unspecified: Secondary | ICD-10-CM | POA: Diagnosis present

## 2020-10-15 DIAGNOSIS — I10 Essential (primary) hypertension: Secondary | ICD-10-CM | POA: Diagnosis present

## 2020-10-15 DIAGNOSIS — N183 Chronic kidney disease, stage 3 unspecified: Secondary | ICD-10-CM | POA: Diagnosis present

## 2020-10-15 DIAGNOSIS — J9811 Atelectasis: Secondary | ICD-10-CM | POA: Diagnosis not present

## 2020-10-15 DIAGNOSIS — N179 Acute kidney failure, unspecified: Secondary | ICD-10-CM | POA: Diagnosis present

## 2020-10-15 DIAGNOSIS — T462X5A Adverse effect of other antidysrhythmic drugs, initial encounter: Secondary | ICD-10-CM | POA: Diagnosis present

## 2020-10-15 DIAGNOSIS — K5790 Diverticulosis of intestine, part unspecified, without perforation or abscess without bleeding: Secondary | ICD-10-CM | POA: Diagnosis present

## 2020-10-15 DIAGNOSIS — R748 Abnormal levels of other serum enzymes: Secondary | ICD-10-CM | POA: Diagnosis present

## 2020-10-15 DIAGNOSIS — C3491 Malignant neoplasm of unspecified part of right bronchus or lung: Principal | ICD-10-CM | POA: Diagnosis present

## 2020-10-15 DIAGNOSIS — Z7902 Long term (current) use of antithrombotics/antiplatelets: Secondary | ICD-10-CM

## 2020-10-15 DIAGNOSIS — R590 Localized enlarged lymph nodes: Secondary | ICD-10-CM | POA: Diagnosis present

## 2020-10-15 DIAGNOSIS — R0902 Hypoxemia: Secondary | ICD-10-CM

## 2020-10-15 DIAGNOSIS — I493 Ventricular premature depolarization: Secondary | ICD-10-CM | POA: Diagnosis present

## 2020-10-15 DIAGNOSIS — E873 Alkalosis: Secondary | ICD-10-CM | POA: Diagnosis present

## 2020-10-15 DIAGNOSIS — B179 Acute viral hepatitis, unspecified: Secondary | ICD-10-CM | POA: Diagnosis present

## 2020-10-15 DIAGNOSIS — Z955 Presence of coronary angioplasty implant and graft: Secondary | ICD-10-CM

## 2020-10-15 DIAGNOSIS — D72829 Elevated white blood cell count, unspecified: Secondary | ICD-10-CM | POA: Diagnosis present

## 2020-10-15 DIAGNOSIS — C787 Secondary malignant neoplasm of liver and intrahepatic bile duct: Secondary | ICD-10-CM | POA: Diagnosis present

## 2020-10-15 DIAGNOSIS — R0602 Shortness of breath: Secondary | ICD-10-CM | POA: Diagnosis not present

## 2020-10-15 DIAGNOSIS — I2510 Atherosclerotic heart disease of native coronary artery without angina pectoris: Secondary | ICD-10-CM | POA: Diagnosis present

## 2020-10-15 DIAGNOSIS — R9431 Abnormal electrocardiogram [ECG] [EKG]: Secondary | ICD-10-CM | POA: Diagnosis present

## 2020-10-15 DIAGNOSIS — I25110 Atherosclerotic heart disease of native coronary artery with unstable angina pectoris: Secondary | ICD-10-CM | POA: Diagnosis present

## 2020-10-15 DIAGNOSIS — J9601 Acute respiratory failure with hypoxia: Secondary | ICD-10-CM | POA: Diagnosis present

## 2020-10-15 DIAGNOSIS — I350 Nonrheumatic aortic (valve) stenosis: Secondary | ICD-10-CM | POA: Diagnosis present

## 2020-10-15 DIAGNOSIS — Z20822 Contact with and (suspected) exposure to covid-19: Secondary | ICD-10-CM | POA: Diagnosis present

## 2020-10-15 DIAGNOSIS — N400 Enlarged prostate without lower urinary tract symptoms: Secondary | ICD-10-CM | POA: Diagnosis present

## 2020-10-15 DIAGNOSIS — I130 Hypertensive heart and chronic kidney disease with heart failure and stage 1 through stage 4 chronic kidney disease, or unspecified chronic kidney disease: Secondary | ICD-10-CM | POA: Diagnosis present

## 2020-10-15 DIAGNOSIS — I700 Atherosclerosis of aorta: Secondary | ICD-10-CM | POA: Diagnosis present

## 2020-10-15 DIAGNOSIS — N1832 Chronic kidney disease, stage 3b: Secondary | ICD-10-CM | POA: Diagnosis present

## 2020-10-15 DIAGNOSIS — C781 Secondary malignant neoplasm of mediastinum: Secondary | ICD-10-CM | POA: Diagnosis present

## 2020-10-15 LAB — COMPREHENSIVE METABOLIC PANEL
ALT: 433 U/L — ABNORMAL HIGH (ref 0–44)
AST: 195 U/L — ABNORMAL HIGH (ref 15–41)
Albumin: 3.6 g/dL (ref 3.5–5.0)
Alkaline Phosphatase: 367 U/L — ABNORMAL HIGH (ref 38–126)
Anion gap: 14 (ref 5–15)
BUN: 38 mg/dL — ABNORMAL HIGH (ref 8–23)
CO2: 45 mmol/L — ABNORMAL HIGH (ref 22–32)
Calcium: 9 mg/dL (ref 8.9–10.3)
Chloride: 81 mmol/L — ABNORMAL LOW (ref 98–111)
Creatinine, Ser: 1.91 mg/dL — ABNORMAL HIGH (ref 0.61–1.24)
GFR, Estimated: 37 mL/min — ABNORMAL LOW (ref >60)
Glucose, Bld: 144 mg/dL — ABNORMAL HIGH (ref 70–99)
Potassium: 2.7 mmol/L — CL (ref 3.5–5.1)
Sodium: 140 mmol/L (ref 135–145)
Total Bilirubin: 1.7 mg/dL — ABNORMAL HIGH (ref 0.3–1.2)
Total Protein: 6 g/dL — ABNORMAL LOW (ref 6.5–8.1)

## 2020-10-15 LAB — CBC WITH DIFFERENTIAL/PLATELET
Abs Immature Granulocytes: 0.11 10*3/uL — ABNORMAL HIGH (ref 0.00–0.07)
Basophils Absolute: 0 10*3/uL (ref 0.0–0.1)
Basophils Relative: 0 %
Eosinophils Absolute: 0 10*3/uL (ref 0.0–0.5)
Eosinophils Relative: 0 %
HCT: 43.1 % (ref 39.0–52.0)
Hemoglobin: 14.8 g/dL (ref 13.0–17.0)
Immature Granulocytes: 1 %
Lymphocytes Relative: 5 %
Lymphs Abs: 0.6 10*3/uL — ABNORMAL LOW (ref 0.7–4.0)
MCH: 29.7 pg (ref 26.0–34.0)
MCHC: 34.3 g/dL (ref 30.0–36.0)
MCV: 86.4 fL (ref 80.0–100.0)
Monocytes Absolute: 0.6 10*3/uL (ref 0.1–1.0)
Monocytes Relative: 4 %
Neutro Abs: 11.8 10*3/uL — ABNORMAL HIGH (ref 1.7–7.7)
Neutrophils Relative %: 90 %
Platelets: 133 10*3/uL — ABNORMAL LOW (ref 150–400)
RBC: 4.99 MIL/uL (ref 4.22–5.81)
RDW: 12.6 % (ref 11.5–15.5)
WBC: 13.2 10*3/uL — ABNORMAL HIGH (ref 4.0–10.5)
nRBC: 0 % (ref 0.0–0.2)

## 2020-10-15 LAB — TROPONIN I (HIGH SENSITIVITY)
Troponin I (High Sensitivity): 42 ng/L — ABNORMAL HIGH (ref <18)
Troponin I (High Sensitivity): 41 ng/L — ABNORMAL HIGH (ref <18)

## 2020-10-15 LAB — PROTIME-INR
INR: 1 (ref 0.8–1.2)
Prothrombin Time: 13 seconds (ref 11.4–15.2)

## 2020-10-15 LAB — BRAIN NATRIURETIC PEPTIDE: B Natriuretic Peptide: 282.7 pg/mL — ABNORMAL HIGH (ref 0.0–100.0)

## 2020-10-15 LAB — APTT: aPTT: 25 seconds (ref 24–36)

## 2020-10-15 MED ORDER — POTASSIUM CHLORIDE 10 MEQ/100ML IV SOLN
10.0000 meq | Freq: Once | INTRAVENOUS | Status: AC
  Administered 2020-10-15: 10 meq via INTRAVENOUS
  Filled 2020-10-15: qty 100

## 2020-10-15 NOTE — ED Notes (Signed)
Date and time results received: 10/15/20 2140 (use smartphrase ".now" to insert current time)  Test: Potassium Critical Value: 2.7  Name of Provider Notified: [facility]  Orders Received? Or Actions Taken?: Continue to monitor patient.

## 2020-10-15 NOTE — ED Triage Notes (Addendum)
Pt was recently discharged from hosp with new dx of CHF  Pt has had increased weakness and fatigue.  Pt was seen by his MD today with lab draw and was called and told to come to ED  Pt 02 sats on R/A are 88% pt placed on 02 via [place] and sat's increased to 93%

## 2020-10-15 NOTE — ED Provider Notes (Signed)
[facility] EMERGENCY DEPARTMENT Provider Note   CSN: [id number] Arrival date & time: 10/15/20  2016     History Chief Complaint  Patient presents with  . Abnormal Lab    Chase Barton is a 69 y.o. male.  The history is provided by the patient and medical records.  Shortness of Breath Severity:  Moderate Onset quality:  Gradual Duration:  1 week Timing:  Constant Progression:  Worsening Chronicity:  New Context: activity   Relieved by:  Inhaler and sitting up Worsened by:  Activity (laying flat) Associated symptoms: cough, headaches and sputum production   Associated symptoms: no abdominal pain, no chest pain, no fever, no rash, no sore throat and no vomiting   Cough:    Cough characteristics:  Productive   Sputum characteristics:  White   Severity:  Mild   Chronicity:  New Risk factors: tobacco use        Past Medical History:  Diagnosis Date  . Aortic stenosis 04/2012   mild-moderate  . Chronic kidney disease    Stage III  . Coronary artery disease 04/2012   Cardiac cath 04/2012: LAD, 90% proximal, RCA: 100% mid with left to right collaterals, Normal EF, Moderate aortic stenosis (peak -peak gradient of 23 mm Hg), LAD PCI with a DES placment.   . Diverticulitis   . Heart murmur   . Hyperlipidemia   . Hypertension 06/20/2012  . PSVT (paroxysmal supraventricular tachycardia) ([place]) 2018   Status post ablation in March 2018 in [place].  . Substance abuse ([place])    remote  . Tobacco abuse     Patient Active Problem List   Diagnosis Date Noted  . CKD stage G3b/A2, GFR 30-44 and albumin creatinine ratio 30-299 mg/g (HCC) 09/30/2020  . 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 Acute on chronic diastolic heart failure ([place])   . Unstable angina ([place]) 09/27/2020  . PSVT (paroxysmal supraventricular tachycardia) ([place]) 12/25/2016  . COPD with acute exacerbation ([place]) 11/13/2012  . Contact dermatitis and eczema due to plant 10/14/2012  .  Bronchitis with chronic airway obstruction ([place]) 08/07/2012  . Claudication of left lower extremity ([place]) 08/06/2012  . Essential hypertension 06/20/2012  . Carotid bruit 05/06/2012  . Mild aortic stenosis by prior echocardiogram   . Hyperlipidemia with target LDL less than 70   . Tobacco abuse   . Chest pain on exertion 05/01/2012  . Arthritis 05/01/2012  . Systolic murmur 87/86/7672  . Fatigue 05/01/2012  . Coronary artery disease involving native coronary artery of native heart with unstable angina pectoris ([place]) 04/24/2012    Past Surgical History:  Procedure Laterality Date  . CARDIAC CATHETERIZATION     x1 stent  . CARDIAC CATHETERIZATION    . CARDIAC CATHETERIZATION    . CARDIAC CATHETERIZATION N/A 01/05/2016   Procedure: Right/Left Heart Cath and Coronary Angiography;  Surgeon: Wellington Hampshire, MD;  Location: [facility];  Service: Cardiovascular;  Laterality: N/A;  . CARDIAC ELECTROPHYSIOLOGY STUDY AND ABLATION    . CORONARY STENT INTERVENTION N/A 09/29/2020   Procedure: CORONARY STENT INTERVENTION;  Surgeon: Wellington Hampshire, MD;  Location: [facility];  Service: Cardiovascular;  Laterality: N/A;  . INTRAVASCULAR ULTRASOUND/IVUS N/A 09/29/2020   Procedure: Intravascular Ultrasound/IVUS;  Surgeon: Wellington Hampshire, MD;  Location: [facility];  Service: Cardiovascular;  Laterality: N/A;  . RIGHT/LEFT HEART CATH AND CORONARY ANGIOGRAPHY N/A 09/29/2020   Procedure: RIGHT/LEFT HEART CATH AND CORONARY ANGIOGRAPHY;  Surgeon: Wellington Hampshire,  MD;  Location: [facility];  Service: Cardiovascular;  Laterality: N/A;       Family History  Problem Relation Age of Onset  . Heart disease Mother        s/p 4 vessel CABG x 2  . Heart disease Father        s/p CABG  . Cancer Father        metastatic prostate CA  . Cancer Brother 55       Lung    Social History   Tobacco Use  . Smoking status: Former Smoker    Packs/day: 3.00    Types: Cigars     Quit date: 09/25/2020    Years since quitting: 0.0  . Smokeless tobacco: Current User  . Tobacco comment: uses [employer]  Vaping Use  . Vaping Use: Never used  Substance Use Topics  . Alcohol use: Yes    Alcohol/week: 1.0 standard drink    Types: 1 Glasses of wine per week    Comment: rare  . Drug use: No    Home Medications Prior to Admission medications   Medication Sig Start Date End Date Taking? Authorizing Provider  acetaminophen (TYLENOL) 500 MG tablet Take 1,000 mg by mouth daily.   Yes [provider]  albuterol (VENTOLIN HFA) 108 (90 Base) MCG/ACT inhaler Inhale 2 puffs into the lungs every 6 (six) hours as needed for wheezing or shortness of breath. 10/13/20  Yes Loel Dubonnet, NP  amLODipine (NORVASC) 10 MG tablet Take 1 tablet (10 mg total) by mouth daily. 10/02/20  Yes Reino Bellis B, NP  ascorbic acid (VITAMIN C) 500 MG tablet Take 500 mg by mouth daily.    Yes [provider]  aspirin EC 81 MG tablet Take 81 mg by mouth daily.   Yes [provider]  carvedilol (COREG) 12.5 MG tablet Take 1 tablet (12.5 mg total) by mouth 2 (two) times daily with a meal. 10/01/20  Yes Cheryln Manly, NP  clopidogrel (PLAVIX) 75 MG tablet Take 1 tablet (75 mg total) by mouth daily with breakfast. 10/02/20  Yes Reino Bellis B, NP  latanoprost (XALATAN) 0.005 % ophthalmic solution Place 1 drop into both eyes daily.    Yes [provider]  nitroGLYCERIN (NITROSTAT) 0.4 MG SL tablet Place 0.4 mg under the tongue every 5 (five) minutes as needed for chest pain.   Yes [provider]  ranolazine (RANEXA) 1000 MG SR tablet Take 1 tablet (1,000 mg total) by mouth 2 (two) times daily. 05/18/16  Yes Wellington Hampshire, MD  rosuvastatin (CRESTOR) 20 MG tablet Take 1 tablet (20 mg total) by mouth daily. 12/29/19  Yes Wellington Hampshire, MD  sertraline (ZOLOFT) 50 MG tablet Take 1 tablet (50 mg total) by mouth daily. 10/05/20  Yes Wellington Hampshire, MD   tamsulosin (FLOMAX) 0.4 MG CAPS capsule Take 0.4 mg by mouth daily.   Yes [provider]  traMADol (ULTRAM) 50 MG tablet Take 50 mg by mouth every 6 (six) hours as needed for moderate pain.  05/29/20  Yes [provider]  amiodarone (PACERONE) 200 MG tablet Take 400mg  (2tabs) twice a day for 5 days, then 400mg  daily for 5 days, then 200mg  daily Patient not taking: Reported on 10/15/2020 10/01/20   Reino Bellis B, NP  potassium chloride SA (KLOR-CON) 20 MEQ tablet Take 8mEq (2 tablets) 3 times on 10/14/20. Patient not taking: Reported on 10/15/2020 10/14/20   Loel Dubonnet, NP  Allergies    Atorvastatin  Review of Systems   Review of Systems  Constitutional: Negative for fever.  HENT: Negative for congestion, sore throat and trouble swallowing.   Respiratory: Positive for cough, sputum production and shortness of breath. Negative for chest tightness.   Cardiovascular: Positive for palpitations. Negative for chest pain and leg swelling.  Gastrointestinal: Negative for abdominal pain, diarrhea, nausea and vomiting.  Genitourinary: Negative for difficulty urinating.  Musculoskeletal: Positive for myalgias.  Skin: Negative for rash.  Neurological: Positive for headaches.  Psychiatric/Behavioral: Positive for hallucinations.  All other systems reviewed and are negative.   Physical Exam Updated Vital Signs BP (!) 166/68   Pulse (!) 56   Temp 98.3 F (36.8 C) (Oral)   Resp (!) 9   Ht 6\' 2"  (1.88 m)   Wt 95.3 kg   SpO2 92%   BMI 26.96 kg/m   Physical Exam Vitals reviewed.  Constitutional:      Appearance: Normal appearance.  HENT:     Head: Normocephalic and atraumatic.     Nose: Nose normal.     Mouth/Throat:     Mouth: Mucous membranes are moist.     Pharynx: Oropharynx is clear.  Eyes:     Conjunctiva/sclera: Conjunctivae normal.  Cardiovascular:     Rate and Rhythm: Normal rate.     Heart sounds: Normal heart sounds.  Pulmonary:      Effort: Pulmonary effort is normal. No respiratory distress.     Breath sounds: Normal breath sounds. No wheezing, rhonchi or rales.  Abdominal:     General: Abdomen is flat.     Palpations: Abdomen is soft.     Tenderness: There is no abdominal tenderness.  Musculoskeletal:     Cervical back: Neck supple.     Right lower leg: No edema.     Left lower leg: No edema.  Skin:    General: Skin is warm and dry.  Neurological:     Mental Status: He is alert.     Cranial Nerves: No dysarthria or facial asymmetry.     Motor: No tremor or abnormal muscle tone.  Psychiatric:        Mood and Affect: Mood normal.        Behavior: Behavior normal.     ED Results / Procedures / Treatments   Labs (all labs ordered are listed, but only abnormal results are displayed) Labs Reviewed  CBC WITH DIFFERENTIAL/PLATELET - Abnormal; Notable for the following components:      Result Value   WBC 13.2 (*)    Platelets 133 (*)    Neutro Abs 11.8 (*)    Lymphs Abs 0.6 (*)    Abs Immature Granulocytes 0.11 (*)    All other components within normal limits  COMPREHENSIVE METABOLIC PANEL - Abnormal; Notable for the following components:   Potassium 2.7 (*)    Chloride 81 (*)    CO2 45 (*)    Glucose, Bld 144 (*)    BUN 38 (*)    Creatinine, Ser 1.91 (*)    Total Protein 6.0 (*)    AST 195 (*)    ALT 433 (*)    Alkaline Phosphatase 367 (*)    Total Bilirubin 1.7 (*)    GFR, Estimated 37 (*)    All other components within normal limits  BRAIN NATRIURETIC PEPTIDE - Abnormal; Notable for the following components:   B Natriuretic Peptide 282.7 (*)    All other components within normal limits  TROPONIN  I (HIGH SENSITIVITY) - Abnormal; Notable for the following components:   Troponin I (High Sensitivity) 42 (*)    All other components within normal limits  RESPIRATORY PANEL BY RT PCR (FLU A&B, COVID)  PROTIME-INR  APTT  BLOOD GAS, VENOUS  D-DIMER, QUANTITATIVE (NOT AT [facility])  TROPONIN I (HIGH  SENSITIVITY)    EKG None  Radiology DG Chest 2 View  Result Date: 10/15/2020 CLINICAL DATA:  Shortness of breath EXAM: CHEST - 2 VIEW COMPARISON:  09/29/2020 FINDINGS: Right hilar fullness, similar to prior study. Cannot exclude adenopathy. Heart is normal size. Bilateral lower lobe airspace opacities are similar to prior study. No effusions. No acute bony abnormality. IMPRESSION: Bilateral lower lobe airspace opacities could reflect atelectasis or infiltrates, stable. Stable right hilar fullness. Cannot exclude adenopathy. This could be further evaluated with chest CT with IV contrast if felt clinically indicated. Electronically Signed   By: Rolm Baptise M.D.   On: 10/15/2020 21:00    Procedures Procedures (including critical care time)  Medications Ordered in ED Medications  potassium chloride 10 mEq in 100 mL IVPB (has no administration in time range)    ED Course  I have reviewed the triage vital signs and the nursing notes.  Pertinent labs & imaging results that were available during my care of the patient were reviewed by me and considered in my medical decision making (see chart for details).    MDM Rules/Calculators/A&P                          Medical Decision Making: Chase Barton is a 69 y.o. male who presented to the ED today with abnormal labs, told to come in for low K, and high LFTs.  Past medical history significant for hx of CAD with prior stenting, mod AS, pSVT, CKD stage 3, chronic diastolic CHF, prolonged Qt. Recently discharged after new CHF dx, persistent weakness and fatigue.  Seen at [facility], labs obtained were concerning, hypokalemia, K 2.4, and transaminitis. Pt taking  amiodarone for PVCs, LFT elevated in clinic (AST 322, ALT 550), advised to hold amiodarone and lasix, pt hypoxic in clinic with exertion, advised to go to ED for evaluation.  Of note pt reports severe fatigue, generalized weakness, and worsening SOB with exertion over the last 1 week. Pt  reports occasional cough, with clear sputum, no fever or chills. Pt having hypoxia at home in high 80s, pt reports hallucinations at night. Pt reports he has lost significant weight since admission with lasix use, and swelling in legs is much improved.   Reviewed and confirmed nursing documentation for past medical history, family history, social history.  On my initial exam, the pt was in NAD, saturating 92-94% on 2L [place], lungs CTAB, no LE edema. On room pt air sating 88-89%.   CXR with Bilateral lower lobe airspace opacities- stable compared to prior.  Not volume overloaded on exam, no rales or edema,  BNP 282 (605 prior). Do not suspect HF exacerbation at this time.  No change in cough, no fever, no tachycardia. WBC stable-y elevated, CXR without focal consolidations to suggest pneumonia. Will obtain COVID. Do not suspect pneumonia at this time.  Unclear etiology of hypoxia and dyspnea at this time, will obtain d-dimer and blood gas.  Potassium 2.7, Pt given 1 mEq potassium IV for hypokalemia.   Consults: none performed All radiology and laboratory studies reviewed independently and with my attending physician, agree with reading provided by radiologist  unless otherwise noted.   Upon reassessing patient, patient was in NAD normal WOB, sating appropriately on 2 L [place].   Based on the above findings, I believe patient requires admission.   The above care was discussed with and agreed upon by my attending physician.  Emergency Department Medication Summary:  Medications  potassium chloride 10 mEq in 100 mL IVPB (has no administration in time range)     Final Clinical Impression(s) / ED Diagnoses Final diagnoses:  Hypokalemia  Hypoxia    Rx / DC Orders ED Discharge Orders    None       Roosevelt Locks, MD 10/15/20 2310    Lajean Saver, MD 10/16/20 1527

## 2020-10-15 NOTE — Telephone Encounter (Signed)
Patient daughter called to update [place] on recent labs done by the [place]. He has hx of CAD with prior stenting, mod AS, pSVT, CKD stage 3, chronic diastolic CHF, prolonged Qt. Patient was recently seen 10/14/20 in the office. He had hypokalemia, hypoxemia. Dry on exam. Also taking amiodarone for PVCs (this is new). Labs drawn that day with multiple abnormalities. Lasix was stopped and he was given potassium. [facility] from [facility] reported CO2 45, K 2.4, Cr 1.7 (improved), BUN 45, AST 322, ALT 550. The MD at the [facility] called and recommended ED evaluation. Patient denied LLE or chest pain. Said his O2 was dropping and he was SOB on exertion. Also generally feeling weak. I agreed with ER evaluation.  Elevated LFTs could be from amiodarone. Pt was advised to hold this. Will inform MD and Laurann [place], NP.   Tinna Kolker Kathlen Mody, PA-C

## 2020-10-16 ENCOUNTER — Other Ambulatory Visit: Payer: Self-pay

## 2020-10-16 ENCOUNTER — Observation Stay (HOSPITAL_COMMUNITY): Payer: No Typology Code available for payment source

## 2020-10-16 ENCOUNTER — Emergency Department (HOSPITAL_COMMUNITY): Payer: No Typology Code available for payment source

## 2020-10-16 DIAGNOSIS — C773 Secondary and unspecified malignant neoplasm of axilla and upper limb lymph nodes: Secondary | ICD-10-CM | POA: Diagnosis not present

## 2020-10-16 DIAGNOSIS — R911 Solitary pulmonary nodule: Secondary | ICD-10-CM | POA: Diagnosis not present

## 2020-10-16 DIAGNOSIS — E876 Hypokalemia: Secondary | ICD-10-CM | POA: Diagnosis present

## 2020-10-16 DIAGNOSIS — I130 Hypertensive heart and chronic kidney disease with heart failure and stage 1 through stage 4 chronic kidney disease, or unspecified chronic kidney disease: Secondary | ICD-10-CM | POA: Diagnosis present

## 2020-10-16 DIAGNOSIS — J9601 Acute respiratory failure with hypoxia: Secondary | ICD-10-CM

## 2020-10-16 DIAGNOSIS — I5032 Chronic diastolic (congestive) heart failure: Secondary | ICD-10-CM

## 2020-10-16 DIAGNOSIS — B179 Acute viral hepatitis, unspecified: Secondary | ICD-10-CM | POA: Diagnosis present

## 2020-10-16 DIAGNOSIS — N179 Acute kidney failure, unspecified: Secondary | ICD-10-CM | POA: Diagnosis present

## 2020-10-16 DIAGNOSIS — N400 Enlarged prostate without lower urinary tract symptoms: Secondary | ICD-10-CM | POA: Diagnosis present

## 2020-10-16 DIAGNOSIS — R590 Localized enlarged lymph nodes: Secondary | ICD-10-CM | POA: Diagnosis not present

## 2020-10-16 DIAGNOSIS — J189 Pneumonia, unspecified organism: Secondary | ICD-10-CM | POA: Diagnosis present

## 2020-10-16 DIAGNOSIS — J440 Chronic obstructive pulmonary disease with acute lower respiratory infection: Secondary | ICD-10-CM | POA: Diagnosis present

## 2020-10-16 DIAGNOSIS — C781 Secondary malignant neoplasm of mediastinum: Secondary | ICD-10-CM | POA: Diagnosis present

## 2020-10-16 DIAGNOSIS — Z20822 Contact with and (suspected) exposure to covid-19: Secondary | ICD-10-CM | POA: Diagnosis present

## 2020-10-16 DIAGNOSIS — E873 Alkalosis: Secondary | ICD-10-CM | POA: Diagnosis present

## 2020-10-16 DIAGNOSIS — N1832 Chronic kidney disease, stage 3b: Secondary | ICD-10-CM | POA: Diagnosis present

## 2020-10-16 DIAGNOSIS — E782 Mixed hyperlipidemia: Secondary | ICD-10-CM

## 2020-10-16 DIAGNOSIS — I700 Atherosclerosis of aorta: Secondary | ICD-10-CM | POA: Diagnosis present

## 2020-10-16 DIAGNOSIS — J9690 Respiratory failure, unspecified, unspecified whether with hypoxia or hypercapnia: Secondary | ICD-10-CM | POA: Diagnosis not present

## 2020-10-16 DIAGNOSIS — I350 Nonrheumatic aortic (valve) stenosis: Secondary | ICD-10-CM | POA: Diagnosis present

## 2020-10-16 DIAGNOSIS — K5790 Diverticulosis of intestine, part unspecified, without perforation or abscess without bleeding: Secondary | ICD-10-CM | POA: Diagnosis present

## 2020-10-16 DIAGNOSIS — C7951 Secondary malignant neoplasm of bone: Secondary | ICD-10-CM | POA: Diagnosis present

## 2020-10-16 DIAGNOSIS — C787 Secondary malignant neoplasm of liver and intrahepatic bile duct: Secondary | ICD-10-CM | POA: Diagnosis present

## 2020-10-16 DIAGNOSIS — C3490 Malignant neoplasm of unspecified part of unspecified bronchus or lung: Secondary | ICD-10-CM | POA: Diagnosis not present

## 2020-10-16 DIAGNOSIS — I25110 Atherosclerotic heart disease of native coronary artery with unstable angina pectoris: Secondary | ICD-10-CM

## 2020-10-16 DIAGNOSIS — J449 Chronic obstructive pulmonary disease, unspecified: Secondary | ICD-10-CM

## 2020-10-16 DIAGNOSIS — D72829 Elevated white blood cell count, unspecified: Secondary | ICD-10-CM

## 2020-10-16 DIAGNOSIS — C3491 Malignant neoplasm of unspecified part of right bronchus or lung: Secondary | ICD-10-CM | POA: Diagnosis present

## 2020-10-16 DIAGNOSIS — F17290 Nicotine dependence, other tobacco product, uncomplicated: Secondary | ICD-10-CM | POA: Diagnosis present

## 2020-10-16 DIAGNOSIS — K7689 Other specified diseases of liver: Secondary | ICD-10-CM | POA: Diagnosis not present

## 2020-10-16 DIAGNOSIS — I493 Ventricular premature depolarization: Secondary | ICD-10-CM | POA: Diagnosis present

## 2020-10-16 DIAGNOSIS — I10 Essential (primary) hypertension: Secondary | ICD-10-CM

## 2020-10-16 LAB — I-STAT ARTERIAL BLOOD GAS, ED
Acid-Base Excess: 29 mmol/L — ABNORMAL HIGH (ref 0.0–2.0)
Bicarbonate: 54.8 mmol/L — ABNORMAL HIGH (ref 20.0–28.0)
Calcium, Ion: 1.05 mmol/L — ABNORMAL LOW (ref 1.15–1.40)
HCT: 37 % — ABNORMAL LOW (ref 39.0–52.0)
Hemoglobin: 12.6 g/dL — ABNORMAL LOW (ref 13.0–17.0)
O2 Saturation: 96 %
Patient temperature: 98.3
Potassium: 2.3 mmol/L — CL (ref 3.5–5.1)
Sodium: 136 mmol/L (ref 135–145)
TCO2: >50 mmol/L — ABNORMAL HIGH (ref 22–32)
pCO2 arterial: 58.1 mmHg — ABNORMAL HIGH (ref 32.0–48.0)
pH, Arterial: 7.582 — ABNORMAL HIGH (ref 7.350–7.450)
pO2, Arterial: 72 mmHg — ABNORMAL LOW (ref 83.0–108.0)

## 2020-10-16 LAB — RESPIRATORY PANEL BY RT PCR (FLU A&B, COVID)
Influenza A by PCR: NEGATIVE
Influenza B by PCR: NEGATIVE
SARS Coronavirus 2 by RT PCR: NEGATIVE

## 2020-10-16 LAB — URINALYSIS, COMPLETE (UACMP) WITH MICROSCOPIC
Bacteria, UA: NONE SEEN
Bilirubin Urine: NEGATIVE
Glucose, UA: NEGATIVE mg/dL
Hgb urine dipstick: NEGATIVE
Ketones, ur: NEGATIVE mg/dL
Leukocytes,Ua: NEGATIVE
Nitrite: NEGATIVE
Protein, ur: 100 mg/dL — AB
Specific Gravity, Urine: 1.023 (ref 1.005–1.030)
pH: 6 (ref 5.0–8.0)

## 2020-10-16 LAB — COMPREHENSIVE METABOLIC PANEL
ALT: 338 U/L — ABNORMAL HIGH (ref 0–44)
AST: 156 U/L — ABNORMAL HIGH (ref 15–41)
Albumin: 3.1 g/dL — ABNORMAL LOW (ref 3.5–5.0)
Alkaline Phosphatase: 306 U/L — ABNORMAL HIGH (ref 38–126)
Anion gap: 14 (ref 5–15)
BUN: 35 mg/dL — ABNORMAL HIGH (ref 8–23)
CO2: 40 mmol/L — ABNORMAL HIGH (ref 22–32)
Calcium: 8.3 mg/dL — ABNORMAL LOW (ref 8.9–10.3)
Chloride: 84 mmol/L — ABNORMAL LOW (ref 98–111)
Creatinine, Ser: 1.76 mg/dL — ABNORMAL HIGH (ref 0.61–1.24)
GFR, Estimated: 41 mL/min — ABNORMAL LOW (ref >60)
Glucose, Bld: 116 mg/dL — ABNORMAL HIGH (ref 70–99)
Potassium: 2.4 mmol/L — CL (ref 3.5–5.1)
Sodium: 138 mmol/L (ref 135–145)
Total Bilirubin: 1.7 mg/dL — ABNORMAL HIGH (ref 0.3–1.2)
Total Protein: 5.3 g/dL — ABNORMAL LOW (ref 6.5–8.1)

## 2020-10-16 LAB — AMMONIA: Ammonia: 44 umol/L — ABNORMAL HIGH (ref 9–35)

## 2020-10-16 LAB — RAPID URINE DRUG SCREEN, HOSP PERFORMED
Amphetamines: NOT DETECTED
Barbiturates: NOT DETECTED
Benzodiazepines: NOT DETECTED
Cocaine: NOT DETECTED
Opiates: NOT DETECTED
Tetrahydrocannabinol: NOT DETECTED

## 2020-10-16 LAB — CK: Total CK: 57 U/L (ref 49–397)

## 2020-10-16 LAB — HEPATIC FUNCTION PANEL
ALT: 337 U/L — ABNORMAL HIGH (ref 0–44)
AST: 157 U/L — ABNORMAL HIGH (ref 15–41)
Albumin: 3.2 g/dL — ABNORMAL LOW (ref 3.5–5.0)
Alkaline Phosphatase: 313 U/L — ABNORMAL HIGH (ref 38–126)
Bilirubin, Direct: 0.5 mg/dL — ABNORMAL HIGH (ref 0.0–0.2)
Indirect Bilirubin: 1.2 mg/dL — ABNORMAL HIGH (ref 0.3–0.9)
Total Bilirubin: 1.7 mg/dL — ABNORMAL HIGH (ref 0.3–1.2)
Total Protein: 5.4 g/dL — ABNORMAL LOW (ref 6.5–8.1)

## 2020-10-16 LAB — PROCALCITONIN: Procalcitonin: 9.57 ng/mL

## 2020-10-16 LAB — D-DIMER, QUANTITATIVE: D-Dimer, Quant: 0.81 ug/mL-FEU — ABNORMAL HIGH (ref 0.00–0.50)

## 2020-10-16 LAB — TROPONIN I (HIGH SENSITIVITY): Troponin I (High Sensitivity): 46 ng/L — ABNORMAL HIGH (ref <18)

## 2020-10-16 LAB — MAGNESIUM: Magnesium: 2.3 mg/dL (ref 1.7–2.4)

## 2020-10-16 MED ORDER — NITROGLYCERIN 0.4 MG SL SUBL: 0.4000 mg | SUBLINGUAL_TABLET | SUBLINGUAL | Status: DC | PRN

## 2020-10-16 MED ORDER — TRAMADOL HCL 50 MG PO TABS
50.0000 mg | ORAL_TABLET | Freq: Four times a day (QID) | ORAL | Status: DC | PRN
  Administered 2020-10-17: 50 mg via ORAL
  Filled 2020-10-16: qty 1

## 2020-10-16 MED ORDER — POTASSIUM CHLORIDE 2 MEQ/ML IV SOLN
INTRAVENOUS | Status: DC
  Filled 2020-10-16 (×3): qty 1000

## 2020-10-16 MED ORDER — ENOXAPARIN SODIUM 40 MG/0.4ML ~~LOC~~ SOLN
40.0000 mg | Freq: Every day | SUBCUTANEOUS | Status: DC
  Administered 2020-10-16 – 2020-10-19 (×4): 40 mg via SUBCUTANEOUS
  Filled 2020-10-16 (×4): qty 0.4

## 2020-10-16 MED ORDER — CLOPIDOGREL BISULFATE 75 MG PO TABS
75.0000 mg | ORAL_TABLET | Freq: Every day | ORAL | Status: DC
  Administered 2020-10-16 – 2020-10-19 (×4): 75 mg via ORAL
  Filled 2020-10-16 (×4): qty 1

## 2020-10-16 MED ORDER — TAMSULOSIN HCL 0.4 MG PO CAPS
0.4000 mg | ORAL_CAPSULE | Freq: Every day | ORAL | Status: DC
  Administered 2020-10-16 – 2020-10-19 (×4): 0.4 mg via ORAL
  Filled 2020-10-16 (×4): qty 1

## 2020-10-16 MED ORDER — ASPIRIN EC 81 MG PO TBEC
81.0000 mg | DELAYED_RELEASE_TABLET | Freq: Every day | ORAL | Status: DC
  Administered 2020-10-16 – 2020-10-19 (×4): 81 mg via ORAL
  Filled 2020-10-16 (×4): qty 1

## 2020-10-16 MED ORDER — SODIUM CHLORIDE 0.9 % IV SOLN
3.0000 g | Freq: Four times a day (QID) | INTRAVENOUS | Status: DC
  Administered 2020-10-16 – 2020-10-19 (×12): 3 g via INTRAVENOUS
  Filled 2020-10-16 (×4): qty 8
  Filled 2020-10-16 (×2): qty 3
  Filled 2020-10-16 (×2): qty 8
  Filled 2020-10-16 (×2): qty 3
  Filled 2020-10-16: qty 8
  Filled 2020-10-16 (×2): qty 3
  Filled 2020-10-16: qty 8

## 2020-10-16 MED ORDER — ONDANSETRON HCL 4 MG/2ML IJ SOLN: 4.0000 mg | Freq: Four times a day (QID) | INTRAMUSCULAR | Status: DC | PRN

## 2020-10-16 MED ORDER — POTASSIUM CHLORIDE 10 MEQ/100ML IV SOLN
10.0000 meq | INTRAVENOUS | Status: AC
  Administered 2020-10-16 (×2): 10 meq via INTRAVENOUS
  Filled 2020-10-16 (×2): qty 100

## 2020-10-16 MED ORDER — ASCORBIC ACID 500 MG PO TABS
500.0000 mg | ORAL_TABLET | Freq: Every day | ORAL | Status: DC
  Administered 2020-10-16 – 2020-10-19 (×4): 500 mg via ORAL
  Filled 2020-10-16 (×4): qty 1

## 2020-10-16 MED ORDER — AMLODIPINE BESYLATE 10 MG PO TABS
10.0000 mg | ORAL_TABLET | Freq: Every day | ORAL | Status: DC
  Administered 2020-10-16 – 2020-10-19 (×4): 10 mg via ORAL
  Filled 2020-10-16: qty 2
  Filled 2020-10-16 (×3): qty 1

## 2020-10-16 MED ORDER — LACTATED RINGERS IV SOLN: INTRAVENOUS | Status: DC

## 2020-10-16 MED ORDER — CARVEDILOL 12.5 MG PO TABS
12.5000 mg | ORAL_TABLET | Freq: Two times a day (BID) | ORAL | Status: DC
  Administered 2020-10-16 – 2020-10-19 (×7): 12.5 mg via ORAL
  Filled 2020-10-16: qty 4
  Filled 2020-10-16 (×6): qty 1

## 2020-10-16 MED ORDER — SERTRALINE HCL 50 MG PO TABS
50.0000 mg | ORAL_TABLET | Freq: Every day | ORAL | Status: DC
  Administered 2020-10-16 – 2020-10-19 (×4): 50 mg via ORAL
  Filled 2020-10-16 (×4): qty 1

## 2020-10-16 MED ORDER — POLYETHYLENE GLYCOL 3350 17 G PO PACK: 17.0000 g | PACK | Freq: Every day | ORAL | Status: DC | PRN

## 2020-10-16 MED ORDER — ONDANSETRON HCL 4 MG PO TABS: 4.0000 mg | ORAL_TABLET | Freq: Four times a day (QID) | ORAL | Status: DC | PRN

## 2020-10-16 MED ORDER — POTASSIUM CHLORIDE CRYS ER 20 MEQ PO TBCR
40.0000 meq | EXTENDED_RELEASE_TABLET | ORAL | Status: AC
  Administered 2020-10-16 (×2): 40 meq via ORAL
  Filled 2020-10-16 (×2): qty 2

## 2020-10-16 MED ORDER — LATANOPROST 0.005 % OP SOLN
1.0000 [drp] | Freq: Every day | OPHTHALMIC | Status: DC
  Administered 2020-10-16 – 2020-10-19 (×4): 1 [drp] via OPHTHALMIC
  Filled 2020-10-16: qty 2.5

## 2020-10-16 MED ORDER — IPRATROPIUM-ALBUTEROL 0.5-2.5 (3) MG/3ML IN SOLN
3.0000 mL | Freq: Three times a day (TID) | RESPIRATORY_TRACT | Status: DC
  Administered 2020-10-17 (×3): 3 mL via RESPIRATORY_TRACT
  Filled 2020-10-16 (×4): qty 3

## 2020-10-16 MED ORDER — RANOLAZINE ER 500 MG PO TB12
1000.0000 mg | ORAL_TABLET | Freq: Two times a day (BID) | ORAL | Status: DC
  Administered 2020-10-16 – 2020-10-19 (×7): 1000 mg via ORAL
  Filled 2020-10-16 (×7): qty 2

## 2020-10-16 MED ORDER — IPRATROPIUM-ALBUTEROL 0.5-2.5 (3) MG/3ML IN SOLN
3.0000 mL | Freq: Four times a day (QID) | RESPIRATORY_TRACT | Status: DC
  Administered 2020-10-16 (×4): 3 mL via RESPIRATORY_TRACT
  Filled 2020-10-16 (×3): qty 3

## 2020-10-16 MED ORDER — IPRATROPIUM-ALBUTEROL 0.5-2.5 (3) MG/3ML IN SOLN
3.0000 mL | RESPIRATORY_TRACT | Status: DC | PRN
  Filled 2020-10-16: qty 3

## 2020-10-16 NOTE — Progress Notes (Addendum)
PROGRESS NOTE        PATIENT DETAILS Name: Chase Barton Age: 69 y.o. Sex: male Date of Birth: 02-03-1951 Admit Date: 10/15/2020 Admitting Physician Vernelle Emerald, MD [id number], Clent Ridges, MD  Brief Narrative: Patient is a 69 y.o. male with history of tobacco use (quit earlier this month), CAD-s/p PCI to LAD in 2013-subsequent PCI to RCA/LCx on 09/29/2020-presenting with exertional dyspnea, fatigue, 20-30 pound weight loss-outpatient lab work-up showed AKI, elevated LFTs-found to have acute hypoxic respiratory failure requiring O2 supplementation in the emergency room-subsequent imaging studies done in the emergency room suggestive of metastatic lung cancer-admitted to the hospitalist service for further evaluation and treatment.  Significant events: 10/4-10/8>> hospitalization under cardiology service-decompensated diastolic heart failure-s/p PCI to RCA/circumflex on 10/6 10/22>> admit for acute hypoxic respiratory failure, AKI, elevated LFTs-imaging studies suggestive of metastatic lung cancer  Significant studies: 10/5>> Echo: EF 50-09%, grade 2 diastolic dysfunction 38/18>> RUQ ultrasound: Liver parenchyma completely replaced by nodular echogenic mass-suspicious for metastatic disease, portal vein dilatation suggestive of portal venous hypertension. 10/23>> CT chest: Right perihilar mass, mediastinal adenopathy, right/left axillary adenopathy, left 6/8 rib mets, diffuse liver metastases  Antimicrobial therapy: None  Microbiology data: 10/23>> urine culture: Pending  Procedures : None  Consults: Phone consult-cardiology-Dr. Thompson Grayer on 10/23 IR  DVT Prophylaxis : enoxaparin (LOVENOX) injection 40 mg Start: 10/16/20 1000  Subjective: Appears stable at rest-on 2 L of oxygen this morning. Acknowledges exertional dyspnea along with fatigue, 20-30 pound weight loss over the past few months.  Assessment/Plan: Acute hypoxic respiratory  failure: Secondary to lung mass-possible postobstructive pneumonia-given significantly elevated procalcitonin-start Unasyn per pharmacy.  Does not appear to be volume overloaded.  Follow.  AKI on CKD stage IIIb: AKI likely hemodynamically mediated-secondary diuretics/poor oral intake in the setting of metastatic lung cancer.  Does have proteinuria-could have membranous nephropathy due to lung cancer.  Renal function improving with supportive care-doubt further work-up will change outcome and management.  Avoid nephrotoxic agents.  Elevated liver enzymes: Multifactorial-secondary to amiodarone use-replacement of liver by med/tumor.  Stop amiodarone-continue supportive care-avoid hepatotoxic agents-liver enzymes slowly downtrending.  Hypokalemia: Replete and recheck.  Newly diagnosed/probable metastatic lung CA: History of significant tobacco use-CT chest with what appears to be lung mass with mets to liver/bone/mediastinum-high suspicion for bronchogenic carcinoma.  Difficult situation-just had PCI to RCA/LCx on 10/6-discussed with cardiology-Dr. Allred over the phone-recommends NOT TO STOP dual antiplatelet agents for 1-3 months in order to perform biopsies.  Subsequently spoke with IR-Dr. Alphonzo Severance imaging studies-IR able to perform biopsy of his axillary mass as a first step even while on dual antiplatelet agents.  If this is nondiagnostic-IR to contemplate performing a transjugular liver biopsy as the next step even while on dual antiplatelets.  Formal consult to IR placed.  Will need oncology evaluation after biopsy.  Chronic diastolic heart failure: Euvolemic on exam-acknowledges significant improvement in volume status-apparently did have significant ankle edema during his most recent hospitalization.  Hold Lasix.  CAD s/p PCI to LAD 2013-and PCI to RCA/LCx 09/29/2020: Continue aspirin/Plavix/beta-blocker/Ranexa-statin on hold due to elevated LFTs.  See above discussion regarding dual  antiplatelet agents.  History of PVCs/bigeminy: Placed on suppressive amiodarone last admission-have discontinued due to elevated LFTs-follow telemetry and optimize beta-blocker regimen accordingly.  HTN: BP reasonable-continue amlodipine and Coreg-reassess in the next 24 hours  COPD: No flare-continue bronchodilators  BPH: Continue  Flomax   Diet: Diet Order            Diet Heart Room service appropriate? Yes; Fluid consistency: Thin  Diet effective now                  Code Status: Full code   Family Communication: Spoke with daughter Zacarias Pontes who is a HHRN-tel [phone])  over phone-potential cancer diagnosis discussed-understands that once biopsy is performed/electrolyte disorders are corrected-he should be stable for discharge- further staging work-up/prognostication etc can be done at follow-up with oncology in the outpatient setting.  Note-patient asked I call Daughter Lora-as she is a Therapist, sports and is in town currently  Disposition Plan: Status is: Observation  The patient will require care spanning > 2 midnights and should be moved to inpatient because: Inpatient level of care appropriate due to severity of illness  Dispo: The patient is from: Home              Anticipated d/c is to: Home              Anticipated d/c date is: 2 days              Patient currently is not medically stable to d/c.  Barriers to Discharge: Hypokalemia/AKI/hepatitis-requiring IV fluids/IV potassium supplementation-postobstructive pneumonia-starting IV antibiotics  Antimicrobial agents: Anti-infectives (From admission, onward)   None       Time spent: 35 minutes-Greater than 50% of this time was spent in counseling, explanation of diagnosis, planning of further management, and coordination of care.  MEDICATIONS: Scheduled Meds: . amLODipine  10 mg Oral Daily  . ascorbic acid  500 mg Oral Daily  . aspirin EC  81 mg Oral Daily  . carvedilol  12.5 mg Oral BID WC  . clopidogrel  75 mg  Oral Q breakfast  . enoxaparin (LOVENOX) injection  40 mg Subcutaneous Daily  . ipratropium-albuterol  3 mL Nebulization Q6H  . latanoprost  1 drop Both Eyes Daily  . ranolazine  1,000 mg Oral BID  . sertraline  50 mg Oral Daily  . tamsulosin  0.4 mg Oral Daily   Continuous Infusions: . lactated ringers with kcl 50 mL/hr at 10/16/20 0926  . potassium chloride 10 mEq (10/16/20 0928)   PRN Meds:.ipratropium-albuterol, nitroGLYCERIN, ondansetron **OR** ondansetron (ZOFRAN) IV, polyethylene glycol, traMADol   PHYSICAL EXAM: Vital signs: Vitals:   10/16/20 0700 10/16/20 0745 10/16/20 0800 10/16/20 0833  BP: (!) 152/63 (!) 161/70 (!) 159/92 (!) 159/92  Pulse: (!) 52 (!) 57 (!) 57   Resp: 18  20   Temp:      TempSrc:      SpO2: 94% 95% 96%   Weight:      Height:       Filed Weights   10/15/20 2023  Weight: 95.3 kg   Body mass index is 26.96 kg/m.   Gen Exam:Alert awake-not in any distress HEENT:atraumatic, normocephalic Chest: B/L clear to auscultation anteriorly CVS:S1S2 regular Abdomen:soft non tender, non distended Extremities:no edema Neurology: Non focal Skin: no rash  I have personally reviewed following labs and imaging studies  LABORATORY DATA: CBC: Recent Labs  Lab 10/13/20 1517 10/15/20 2041 10/16/20 0313  WBC 14.0* 13.2*  --   NEUTROABS  --  11.8*  --   HGB 15.4 14.8 12.6*  HCT 44.1 43.1 37.0*  MCV 85 86.4  --   PLT 131* 133*  --     Basic Metabolic Panel: Recent Labs  Lab 10/13/20 1517 10/15/20 2041 10/16/20  9811 10/16/20 0456  NA 141 140 136 138  K 2.1* 2.7* 2.3* 2.4*  CL 79* 81*  --  84*  CO2 45* 45*  --  40*  GLUCOSE 112* 144*  --  116*  BUN 41* 38*  --  35*  CREATININE 2.03* 1.91*  --  1.76*  CALCIUM 9.0 9.0  --  8.3*  MG 2.2  --   --  2.3    GFR: Estimated Creatinine Clearance: 45.4 mL/min (A) (by C-G formula based on SCr of 1.76 mg/dL (H)).  Liver Function Tests: Recent Labs  Lab 10/15/20 2041 10/16/20 0456  AST 195*  156*  157*  ALT 433* 338*  337*  ALKPHOS 367* 306*  313*  BILITOT 1.7* 1.7*  1.7*  PROT 6.0* 5.3*  5.4*  ALBUMIN 3.6 3.1*  3.2*   No results for input(s): LIPASE, AMYLASE in the last 168 hours. Recent Labs  Lab 10/16/20 0325  AMMONIA 44*    Coagulation Profile: Recent Labs  Lab 10/15/20 2041  INR 1.0    Cardiac Enzymes: Recent Labs  Lab 10/16/20 0456  CKTOTAL 57    BNP (last 3 results) No results for input(s): PROBNP in the last 8760 hours.  Lipid Profile: No results for input(s): CHOL, HDL, LDLCALC, TRIG, CHOLHDL, LDLDIRECT in the last 72 hours.  Thyroid Function Tests: No results for input(s): TSH, T4TOTAL, FREET4, T3FREE, THYROIDAB in the last 72 hours.  Anemia Panel: No results for input(s): VITAMINB12, FOLATE, FERRITIN, TIBC, IRON, RETICCTPCT in the last 72 hours.  Urine analysis:    Component Value Date/Time   COLORURINE AMBER (A) 10/16/2020 0300   APPEARANCEUR CLEAR 10/16/2020 0300   LABSPEC 1.023 10/16/2020 0300   PHURINE 6.0 10/16/2020 0300   GLUCOSEU NEGATIVE 10/16/2020 0300   HGBUR NEGATIVE 10/16/2020 0300   BILIRUBINUR NEGATIVE 10/16/2020 0300   KETONESUR NEGATIVE 10/16/2020 0300   PROTEINUR 100 (A) 10/16/2020 0300   NITRITE NEGATIVE 10/16/2020 0300   LEUKOCYTESUR NEGATIVE 10/16/2020 0300    Sepsis Labs: Lactic Acid, Venous No results found for: LATICACIDVEN  MICROBIOLOGY: Recent Results (from the past 240 hour(s))  Respiratory Panel by RT PCR (Flu A&B, Covid) - Nasopharyngeal Swab     Status: None   Collection Time: 10/16/20 12:14 AM   Specimen: Nasopharyngeal Swab  Result Value Ref Range Status   SARS Coronavirus 2 by RT PCR NEGATIVE NEGATIVE Final    Comment: (NOTE) SARS-CoV-2 target nucleic acids are NOT DETECTED.  The SARS-CoV-2 RNA is generally detectable in upper respiratoy specimens during the acute phase of infection. The lowest concentration of SARS-CoV-2 viral copies this assay can detect is 131 copies/mL. A  negative result does not preclude SARS-Cov-2 infection and should not be used as the sole basis for treatment or other patient management decisions. A negative result may occur with  improper specimen collection/handling, submission of specimen other than nasopharyngeal swab, presence of viral mutation(s) within the areas targeted by this assay, and inadequate number of viral copies (<131 copies/mL). A negative result must be combined with clinical observations, patient history, and epidemiological information. The expected result is Negative.  Fact Sheet for Patients:  PinkCheek.be  Fact Sheet for Healthcare Providers:  GravelBags.it  This test is no t yet approved or cleared by the [place] FDA and  has been authorized for detection and/or diagnosis of SARS-CoV-2 by FDA under an Emergency Use Authorization (EUA). This EUA will remain  in effect (meaning this test can be used) for the duration of the COVID-19  declaration under Section 564(b)(1) of the Act, 21 U.S.C. section 360bbb-3(b)(1), unless the authorization is terminated or revoked sooner.     Influenza A by PCR NEGATIVE NEGATIVE Final   Influenza B by PCR NEGATIVE NEGATIVE Final    Comment: (NOTE) The Xpert Xpress SARS-CoV-2/FLU/RSV assay is intended as an aid in  the diagnosis of influenza from Nasopharyngeal swab specimens and  should not be used as a sole basis for treatment. Nasal washings and  aspirates are unacceptable for Xpert Xpress SARS-CoV-2/FLU/RSV  testing.  Fact Sheet for Patients: PinkCheek.be  Fact Sheet for Healthcare Providers: GravelBags.it  This test is not yet approved or cleared by the [place] FDA and  has been authorized for detection and/or diagnosis of SARS-CoV-2 by  FDA under an Emergency Use Authorization (EUA). This EUA will remain  in effect (meaning this test can  be used) for the duration of the  Covid-19 declaration under Section 564(b)(1) of the Act, 21  U.S.C. section 360bbb-3(b)(1), unless the authorization is  terminated or revoked. Performed at [facility], [place] [address], [place], [place] [address]     RADIOLOGY STUDIES/RESULTS: DG Chest 2 View  Result Date: 10/15/2020 CLINICAL DATA:  Shortness of breath EXAM: CHEST - 2 VIEW COMPARISON:  09/29/2020 FINDINGS: Right hilar fullness, similar to prior study. Cannot exclude adenopathy. Heart is normal size. Bilateral lower lobe airspace opacities are similar to prior study. No effusions. No acute bony abnormality. IMPRESSION: Bilateral lower lobe airspace opacities could reflect atelectasis or infiltrates, stable. Stable right hilar fullness. Cannot exclude adenopathy. This could be further evaluated with chest CT with IV contrast if felt clinically indicated. Electronically Signed   By: Rolm Baptise M.D.   On: 10/15/2020 21:00   CT CHEST WO CONTRAST  Result Date: 10/16/2020 CLINICAL DATA:  Respiratory failure. Abnormal x-ray with interstitial infiltrates. Recent CHF. EXAM: CT CHEST WITHOUT CONTRAST TECHNIQUE: Multidetector CT imaging of the chest was performed following the standard protocol without IV contrast. COMPARISON:  Chest x-ray dated 10/15/2020. Abdomen ultrasound from same day. FINDINGS: Cardiovascular: Aortic atherosclerosis. Coronary artery calcifications. No pericardial effusion. No thoracic aortic aneurysm. Mediastinum/Nodes: Bulky lymphadenopathy within the mediastinum, including 2.4 cm short axis lymphadenopathy in the RIGHT lower paratracheal space and 2.2 cm short axis lymphadenopathy within the subcarinal space. Additional bulky lymphadenopathy within the RIGHT axilla, measuring 2.3 cm short axis dimension. Moderately prominent lymphadenopathy within the LEFT axilla, including a 1.3 cm short axis lymph node. Esophagus is unremarkable.  Trachea is unremarkable. Lungs/Pleura: RIGHT  perihilar mass measures 7.6 x 4.9 cm (series 3, image 81). This is causing mass effect on the proximal bronchi to the RIGHT upper lobe, RIGHT middle lobe and RIGHT lower lobe, but these bronchi remain patent. Separate pulmonary nodule within the RIGHT lower lobe, posterior aspect, measures 1.6 cm (image 83). Additional smaller pulmonary nodule within the RIGHT middle lobe measures 6 mm (image 88). Patchy consolidations within the RIGHT lower lung, most likely atelectasis. Upper Abdomen: Diffusely heterogeneous appearance of the liver, highly suspicious for metastatic disease given the earlier ultrasound appearance. Probable underlying cirrhosis. Musculoskeletal: No acute appearing osseous abnormality. No convincing evidence of osseous metastasis. Subtle lucency within the LEFT anterior-lateral fifth and sixth ribs is suspicious for early osseous metastasis. IMPRESSION: 1. RIGHT perihilar mass measures 7.6 x 4.9 cm, consistent with primary bronchogenic carcinoma. This is causing mass effect on the proximal bronchi to the RIGHT upper lobe, RIGHT middle lobe and RIGHT lower lobe, but these bronchi remain patent. 2. Bulky lymphadenopathy  within the mediastinum, RIGHT axilla and LEFT axilla, consistent with metastatic disease. 3. Separate pulmonary nodules within the RIGHT lower lobe and RIGHT middle lobe, largest measuring 1.6 cm, compatible with metastatic disease. 4. Diffusely heterogeneous appearance of the liver, highly suspicious for diffuse liver metastases given the corresponding appearance of the liver on earlier ultrasound. 5. Subtle lucencies within the LEFT anterior-lateral fifth and sixth ribs, suspicious for early osseous metastasis. 6. Patchy consolidations within the RIGHT lower lung, most likely atelectasis. Pneumonia is considered less likely. 7. Coronary artery calcifications. Aortic Atherosclerosis (ICD10-I70.0). Electronically Signed   By: Franki Cabot M.D.   On: 10/16/2020 04:51   US Abdomen  Limited RUQ (LIVER/GB)  Result Date: 10/16/2020 CLINICAL DATA:  Hepatitis. EXAM: ULTRASOUND ABDOMEN LIMITED RIGHT UPPER QUADRANT COMPARISON:  None. FINDINGS: Gallbladder: No gallstones or wall thickening visualized. No sonographic Murphy sign noted by sonographer. Common bile duct: Diameter: 5 mm Liver: Diffusely heterogeneous parenchyma liver is nearly completely replaced by nodular echogenic areas of/masses, most confluent/discrete measuring approximately 1 cm greatest dimension. Portal vein is patent on color Doppler imaging with normal direction of blood flow towards the liver. Other: Portal vein measures 1.9 cm diameter. IMPRESSION: 1. Liver parenchyma is nearly completely replaced throughout by nodular echogenic mass-like foci. These are suspicious for metastatic disease, alternatively markedly prominent regenerating nodules. Recommend liver MRI for further characterization. 2. 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 Portal vein is patent with appropriate hepatopetal direction of blood flow. 3. Gallbladder appears normal. Electronically Signed   By: Franki Cabot M.D.   On: 10/16/2020 04:26     LOS: 0 days   Oren Binet, MD  [facility]    To contact the attending provider between 7A-7P or the covering provider during after hours 7P-7A, please log into the web site [email or web address] and access using universal [place] password for that web site. If you do not have the password, please call the hospital operator.  10/16/2020, 10:17 AM

## 2020-10-16 NOTE — H&P (Signed)
History and Physical    Chase Barton KDT:[id number] DOB: 03-22-1951 DOA: 10/15/2020  PCP: Loma Sender, MD  Patient coming from: Sent by PCP    Chief Complaint:   SOB  HPI:    69 year old male with past medical history of hypertension, PSVT S/P ablation, diastolic congestive heart failure (Echo 09/28/2020 EF 55-60%), chronic kidney disease stage IIIb, hyperlipidemia, coronary artery disease (LAD stent 2013 with 2 more stents placed 09/2020) who presents to [facility] emergency department complaints of shortness of breath.  Of note, patient was hospitalized at [facility] for 4 days in early October for unstable angina status post cardiac catheterization on 10/6 resulting in 2 drug-eluting stents being placed.  Patient additionally was felt to be suffering from acute diastolic congestive heart failure and was diuresed with intravenous diuretics.  During this hospitalization patient was additionally found to have symptomatic palpitations with frequent bouts of bigeminy and was placed on an amiodarone load at time of discharge.  Patient explains that shortly after his discharge from [facility] he felt clinically improved.  Approximately 3 to 4 days later after his discharge patient began to develop new onset of shortness of breath.  Initially the shortness of breath was mild in intensity but as the days progressed shortness of breath became more more severe.  Shortness of breath was worse with exertion and improved with rest.  Patient planes of some associated cough, occasionally productive with small amounts of yellow sputum.  Patient denies any associated fevers, chest pain, sick contacts, recent travel or confirmed contact with COVID-19 infection.  As patient symptoms continue to worsen over the next several days patient began to develop associated paroxysmal nocturnal dyspnea and pillow orthopnea.  Patient also began to develop progressively worsening generalized  weakness, lethargy and "hallucinations."  Patient eventually followed up in cardiology clinic on 10/20 where he was felt to be clinically dry with hypokalemia and mild hypoxia.  At that time patient's diuretic measurement was reduced.  Patient then followed up with his primary care provider at the [facility] on 10/21 who also found the patient to be hypoxic in clinic.  His primary care provider was concerned for amiodarone toxicity as well as the patient's hypoxia.  Instructed the patient to discontinue his amiodarone and diuretics.  On 10/22, due to multiple lab abnormalities patient was contacted by his primary care provider and told to come to [facility] emergency department for evaluation.  Upon evaluation in the emergency department, patient was found to have multiple abnormalities including hypoxia requiring supplemental oxygen via nasal cannula, markedly elevated transaminases including an AST of 195 and ALT of 433, leukocytosis of 13.2, substantial hypokalemia of 2.7 and bilateral lower lobe infiltrates on chest imaging.  Patient was initiated on supplemental oxygen by the emergency room staff and the hospitalist group was called to assess the patient for admission to the hospital.  Review of Systems:   Review of Systems  Constitutional: Positive for malaise/fatigue.  Respiratory: Positive for cough, sputum production and shortness of breath.   Neurological: Positive for sensory change and weakness.  Psychiatric/Behavioral: Positive for hallucinations.  All other systems reviewed and are negative.   Past Medical History:  Diagnosis Date  . Aortic stenosis 04/2012   mild-moderate  . Chronic kidney disease    Stage III  . Coronary artery disease 04/2012   Cardiac cath 04/2012: LAD, 90% proximal, RCA: 100% mid with left to right collaterals, Normal EF, Moderate aortic stenosis (peak -peak gradient  of 23 mm Hg), LAD PCI with a DES placment.   . Diverticulitis   . Heart murmur   .  Hyperlipidemia   . Hypertension 06/20/2012  . PSVT (paroxysmal supraventricular tachycardia) ([place]) 2018   Status post ablation in March 2018 in [place].  . Substance abuse ([place])    remote  . Tobacco abuse     Past Surgical History:  Procedure Laterality Date  . CARDIAC CATHETERIZATION     x1 stent  . CARDIAC CATHETERIZATION    . CARDIAC CATHETERIZATION    . CARDIAC CATHETERIZATION N/A 01/05/2016   Procedure: Right/Left Heart Cath and Coronary Angiography;  Surgeon: Wellington Hampshire, MD;  Location: [place] [facility];  Service: Cardiovascular;  Laterality: N/A;  . CARDIAC ELECTROPHYSIOLOGY STUDY AND ABLATION    . CORONARY STENT INTERVENTION N/A 09/29/2020   Procedure: CORONARY STENT INTERVENTION;  Surgeon: Wellington Hampshire, MD;  Location: [place] [facility];  Service: Cardiovascular;  Laterality: N/A;  . INTRAVASCULAR ULTRASOUND/IVUS N/A 09/29/2020   Procedure: Intravascular Ultrasound/IVUS;  Surgeon: Wellington Hampshire, MD;  Location: [place] [facility];  Service: Cardiovascular;  Laterality: N/A;  . RIGHT/LEFT HEART CATH AND CORONARY ANGIOGRAPHY N/A 09/29/2020   Procedure: RIGHT/LEFT HEART CATH AND CORONARY ANGIOGRAPHY;  Surgeon: Wellington Hampshire, MD;  Location: [place] [facility];  Service: Cardiovascular;  Laterality: N/A;     reports that he quit smoking about 3 weeks ago. His smoking use included cigars. He smoked 3.00 packs per day. He uses smokeless tobacco. He reports current alcohol use of about 1.0 standard drink of alcohol per week. He reports that he does not use drugs.  Allergies  Allergen Reactions  . Atorvastatin Nausea And Vomiting and Other (See Comments)    Family History  Problem Relation Age of Onset  . Heart disease Mother        s/p 4 vessel CABG x 2  . Heart disease Father        s/p CABG  . Cancer Father        metastatic prostate CA  . Cancer Brother 46       Lung     Prior to Admission medications   Medication Sig Start Date End Date Taking?  Authorizing Provider  acetaminophen (TYLENOL) 500 MG tablet Take 1,000 mg by mouth daily.   Yes [provider]  albuterol (VENTOLIN HFA) 108 (90 Base) MCG/ACT inhaler Inhale 2 puffs into the lungs every 6 (six) hours as needed for wheezing or shortness of breath. 10/13/20  Yes Loel Dubonnet, NP  amLODipine (NORVASC) 10 MG tablet Take 1 tablet (10 mg total) by mouth daily. 10/02/20  Yes Reino Bellis B, NP  ascorbic acid (VITAMIN C) 500 MG tablet Take 500 mg by mouth daily.    Yes [provider]  aspirin EC 81 MG tablet Take 81 mg by mouth daily.   Yes [provider]  carvedilol (COREG) 12.5 MG tablet Take 1 tablet (12.5 mg total) by mouth 2 (two) times daily with a meal. 10/01/20  Yes Cheryln Manly, NP  clopidogrel (PLAVIX) 75 MG tablet Take 1 tablet (75 mg total) by mouth daily with breakfast. 10/02/20  Yes Reino Bellis B, NP  latanoprost (XALATAN) 0.005 % ophthalmic solution Place 1 drop into both eyes daily.    Yes [provider]  nitroGLYCERIN (NITROSTAT) 0.4 MG SL tablet Place 0.4 mg under the tongue every 5 (five) minutes as needed for chest pain.   Yes [provider]  ranolazine (RANEXA) 1000 MG SR tablet Take 1 tablet (1,000 mg total) by mouth 2 (two) times daily. 05/18/16  Yes Wellington Hampshire, MD  rosuvastatin (CRESTOR) 20 MG tablet Take 1 tablet (20 mg total) by mouth daily. 12/29/19  Yes Wellington Hampshire, MD  sertraline (ZOLOFT) 50 MG tablet Take 1 tablet (50 mg total) by mouth daily. 10/05/20  Yes Wellington Hampshire, MD  tamsulosin (FLOMAX) 0.4 MG CAPS capsule Take 0.4 mg by mouth daily.   Yes [provider]  traMADol (ULTRAM) 50 MG tablet Take 50 mg by mouth every 6 (six) hours as needed for moderate pain.  05/29/20  Yes [provider]  amiodarone (PACERONE) 200 MG tablet Take 400mg  (2tabs) twice a day for 5 days, then 400mg  daily for 5 days, then 200mg  daily Patient not taking: Reported on 10/15/2020 10/01/20    Reino Bellis B, NP  potassium chloride SA (KLOR-CON) 20 MEQ tablet Take 51mEq (2 tablets) 3 times on 10/14/20. Patient not taking: Reported on 10/15/2020 10/14/20   Loel Dubonnet, NP    Physical Exam: Vitals:   10/16/20 0000 10/16/20 0100 10/16/20 0200 10/16/20 0300  BP: (!) 165/70 (!) 157/67 (!) 158/75 (!) 171/72  Pulse: (!) 54 (!) 56 (!) 57 (!) 58  Resp: 17 17 14 19   Temp:      TempSrc:      SpO2: 94% 93% 92% 94%  Weight:      Height:        Constitutional: Acute alert and oriented x3, no associated distress.   Skin: Notable hyperemia of the anterior chest and face.  Otherwise, dark almost dusky discoloration of the skin in general.  Poor skin turgor noted.  No other rashes or lesions seen.   Eyes: Pupils are equally reactive to light.  No evidence of scleral icterus or conjunctival pallor.  ENMT: Somewhat dry membranes noted.  Posterior pharynx clear of any exudate or lesions.   Neck: normal, supple, no masses, no thyromegaly.  Slight elevation of the jugular venous pulse at 45 degrees.   Respiratory: Mild bibasilar rales.  Notable expiratory wheezing in all fields without prolonged Tory phase.  Normal respiratory effort. No accessory muscle use.  Cardiovascular: Bradycardic rate and regular rhythm, no murmurs / rubs / gallops. No extremity edema. 2+ pedal pulses. No carotid bruits.  Chest:   Nontender without crepitus or deformity.   Back:   Nontender without crepitus or deformity. Abdomen: Abdomen is protuberant but soft and nontender.  No evidence of intra-abdominal masses.  Positive bowel sounds noted in all quadrants.   Musculoskeletal: No joint deformity upper and lower extremities. Good ROM, no contractures. Normal muscle tone.  Neurologic: CN 2-12 grossly intact. Sensation intact.  Patient moving all 4 extremities spontaneously.  Patient is following all commands.  Patient is responsive to verbal stimuli.   Psychiatric: Patient exhibits normal mood with appropriate  affect.  Patient seems to possess insight as to their current situation.     Labs on Admission: I have personally reviewed following labs and imaging studies -   CBC: Recent Labs  Lab 10/13/20 1517 10/15/20 2041 10/16/20 0313  WBC 14.0* 13.2*  --   NEUTROABS  --  11.8*  --   HGB 15.4 14.8 12.6*  HCT 44.1 43.1 37.0*  MCV 85 86.4  --   PLT 131* 133*  --    Basic Metabolic Panel: Recent Labs  Lab 10/13/20 1517 10/15/20 2041 10/16/20 0313  NA 141 140 136  K  2.1* 2.7* 2.3*  CL 79* 81*  --   CO2 45* 45*  --   GLUCOSE 112* 144*  --   BUN 41* 38*  --   CREATININE 2.03* 1.91*  --   CALCIUM 9.0 9.0  --   MG 2.2  --   --    GFR: Estimated Creatinine Clearance: 41.8 mL/min (A) (by C-G formula based on SCr of 1.91 mg/dL (H)). Liver Function Tests: Recent Labs  Lab 10/15/20 2041  AST 195*  ALT 433*  ALKPHOS 367*  BILITOT 1.7*  PROT 6.0*  ALBUMIN 3.6   No results for input(s): LIPASE, AMYLASE in the last 168 hours. Recent Labs  Lab 10/16/20 0325  AMMONIA 44*   Coagulation Profile: Recent Labs  Lab 10/15/20 2041  INR 1.0   Cardiac Enzymes: No results for input(s): CKTOTAL, CKMB, CKMBINDEX, TROPONINI in the last 168 hours. BNP (last 3 results) No results for input(s): PROBNP in the last 8760 hours. HbA1C: No results for input(s): HGBA1C in the last 72 hours. CBG: No results for input(s): GLUCAP in the last 168 hours. Lipid Profile: No results for input(s): CHOL, HDL, LDLCALC, TRIG, CHOLHDL, LDLDIRECT in the last 72 hours. Thyroid Function Tests: No results for input(s): TSH, T4TOTAL, FREET4, T3FREE, THYROIDAB in the last 72 hours. Anemia Panel: No results for input(s): VITAMINB12, FOLATE, FERRITIN, TIBC, IRON, RETICCTPCT in the last 72 hours. Urine analysis:    Component Value Date/Time   COLORURINE AMBER (A) 10/16/2020 0300   APPEARANCEUR CLEAR 10/16/2020 0300   LABSPEC 1.023 10/16/2020 0300   PHURINE 6.0 10/16/2020 0300   GLUCOSEU NEGATIVE 10/16/2020  0300   HGBUR NEGATIVE 10/16/2020 0300   BILIRUBINUR NEGATIVE 10/16/2020 0300   KETONESUR NEGATIVE 10/16/2020 0300   PROTEINUR 100 (A) 10/16/2020 0300   NITRITE NEGATIVE 10/16/2020 0300   LEUKOCYTESUR NEGATIVE 10/16/2020 0300    Radiological Exams on Admission - Personally Reviewed: DG Chest 2 View  Result Date: 10/15/2020 CLINICAL DATA:  Shortness of breath EXAM: CHEST - 2 VIEW COMPARISON:  09/29/2020 FINDINGS: Right hilar fullness, similar to prior study. Cannot exclude adenopathy. Heart is normal size. Bilateral lower lobe airspace opacities are similar to prior study. No effusions. No acute bony abnormality. IMPRESSION: Bilateral lower lobe airspace opacities could reflect atelectasis or infiltrates, stable. Stable right hilar fullness. Cannot exclude adenopathy. This could be further evaluated with chest CT with IV contrast if felt clinically indicated. Electronically Signed   By: Rolm Baptise M.D.   On: 10/15/2020 21:00   US Abdomen Limited RUQ (LIVER/GB)  Result Date: 10/16/2020 CLINICAL DATA:  Hepatitis. EXAM: ULTRASOUND ABDOMEN LIMITED RIGHT UPPER QUADRANT COMPARISON:  None. FINDINGS: Gallbladder: No gallstones or wall thickening visualized. No sonographic Murphy sign noted by sonographer. Common bile duct: Diameter: 5 mm Liver: Diffusely heterogeneous parenchyma liver is nearly completely replaced by nodular echogenic areas of/masses, most confluent/discrete measuring approximately 1 cm greatest dimension. Portal vein is patent on color Doppler imaging with normal direction of blood flow towards the liver. Other: Portal vein measures 1.9 cm diameter. IMPRESSION: 1. Liver parenchyma is nearly completely replaced throughout by nodular echogenic mass-like foci. These are suspicious for metastatic disease, alternatively markedly prominent regenerating nodules. Recommend liver MRI for further characterization. 2. 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  Portal vein is patent with appropriate hepatopetal direction of blood flow. 3. Gallbladder appears normal. Electronically Signed   By: Franki Cabot M.D.   On: 10/16/2020 04:26    EKG: Personally reviewed.  Rhythm is sinus bradycardia with heart rate of 59 bpm.  No dynamic ST segment changes appreciated.  Assessment/Plan Principal Problem:   Acute hepatitis   Patient presenting with nearly 2-week history of progressively worsening generalized weakness and lethargy in the setting of evidence of acute elevation of transaminases, elevated bilirubin and alkaline phosphatase  Based on history the etiology is unclear.  There are numerous differential possibilities for the patient's presentation.  This would include progressive congestive heart failure with hepatic congestion, medication induced liver injury (amiodarone toxicity, albeit unlikely), hepatobiliary disease, viral hepatitis or malignancy.  Patient's home regimen of recently initiated amiodarone has been discontinued.  Obtaining right upper quadrant ultrasound  Obtaining dedicated hepatic function panel, hepatitis panel, ammonia, toxicology screen, ethanol level and acetaminophen level  Based on results of this initial work-up, will consider GI consultation or further work-up/management.  Holding home regimen of lipid-lowering therapy  Active Problems:   Acute respiratory failure with hypoxia [facility])   Patient presenting with mild hypoxia requiring submental oxygen via nasal cannula  Chest x-ray reveals vague patchy lower lobe infiltrates  COVID-19 testing negative  Patient possesses a leukocytosis but in the absence of fever or compelling history pneumonia is extremely unlikely.  D-dimer is only slightly elevated, acute pulmonary embolism is unlikely  BNP is actually lower than it was during the patient's recent hospitalization and therefore acute heart failure is unlikely.  We will obtain CT imaging of the chest to  better evaluate infiltrates -unable to do this with contrast due to renal function at this time.  Obtaining ABG  If pulmonary embolism is still considered a possibility we will consider VQ scan after initial work-up.    Coronary artery disease involving native coronary artery of native heart with unstable angina pectoris ([place])  . Patient is currently chest pain free . Monitoring patient on telemetry . Continue home regimen of antiplatelet therapy and AV nodal blocking therapy . Holding home regimen of statin therapy    Mixed hyperlipidemia   Holding home regimen of statin therapy in light of liver dysfunction.    Essential hypertension   Continue home regimen of antihypertensive therapy as blood pressure tolerates    COPD (chronic obstructive pulmonary disease) (HCC)   Patient exhibiting some diffuse expiratory wheezing on lung examination  However, patient does not seem to particularly be in respiratory distress, nor does he exhibit prolonged spark Tory phase that he would typically see in a COPD exacerbation.  Will place patient on scheduled bronchodilator therapy and reassess  Obtaining ABG as mentioned above.    Chronic diastolic CHF (congestive heart failure) ([place])   Patient appears clinically dry  Recently prescribed regimen of diuretics have been discontinued  Gentle intravenous hydration to avoid putting patient into volume overload.  Acute renal failure superimposed on chronic kidney disease stage IIIb   Patient presenting with mild acute kidney injury (Cr 2.03 three days ago, 1.91 on presentation today) above baseline of 1.5  This is associated with substantial metabolic alkalosis based on chemistry suggestive of excessive overdiuresis  This is likely secondary to volume depletion/overdiuresis  Gentle intravenous hydration with intravenous isotonic fluids to avoid putting patient into volume overload  Strict input and output monitoring  Monitoring  renal function and electrolytes with serial chemistries    Hypokalemia   Likely secondary to a combination of use of diuretics in addition to metabolic alkalosis which drives the potassium levels down even further  Replacing with oral potassium chloride  Obtaining magnesium level  Monitoring potassium  levels with serial chemistries    Leukocytosis  Mild leukocytosis in the absence of fever or symptomatology consistent with infection  Obtaining CRP and procalcitonin  Obtaining CT imaging of the chest to better evaluate infiltrates  Obtaining urinalysis   Monitoring for any evidence of fever    Code Status:  Full code Family Communication: deferred   Status is: Observation  The patient remains OBS appropriate and will d/c before 2 midnights.  Dispo: The patient is from: Home              Anticipated d/c is to: Home              Anticipated d/c date is: 2 days              Patient currently is not medically stable to d/c.        Vernelle Emerald MD [facility] Pager [phone]  If 7PM-7AM, please contact night-coverage [email or web address] Use universal [place] password for that web site. If you do not have the password, please call the hospital operator.  10/16/2020, 4:51 AM

## 2020-10-16 NOTE — ED Notes (Signed)
Breakfast ordered 

## 2020-10-16 NOTE — Telephone Encounter (Signed)
Please check on him Monday. Agree with holding Amiodarone. K should be replaced but will see what was done.

## 2020-10-16 NOTE — Consult Note (Signed)
Chief Complaint: Patient was seen in consultation today for US guided right axillary lymph node biopsy Chief Complaint  Patient presents with  . Abnormal Lab    Referring Physician(s): Ghimire,S  Supervising Physician: Markus Daft  Patient Status: [facility] - In-pt  History of Present Illness: Chase Barton is a 69 y.o. male with multiple medical problems including aortic stenosis, chronic kidney disease, coronary artery disease with recent stenting on 09/29/2020 and currently on Plavix, diverticular disease, hyperlipidemia, hypertension, PSVT with prior ablation in 2018, remote substance abuse, prior tobacco abuse, who presented to [facility] on 10/22 with fatigue, persistent dyspnea, cough and reported weight loss.  He was found to be hypoxic requiring supplemental O2, with markedly elevated transaminases, leukocytosis, and hypokalemia.  Subsequent imaging revealed:  1. RIGHT perihilar mass measures 7.6 x 4.9 cm, consistent with primary bronchogenic carcinoma. This is causing mass effect on the proximal bronchi to the RIGHT upper lobe, RIGHT middle lobe and RIGHT lower lobe, but these bronchi remain patent. 2. Bulky lymphadenopathy within the mediastinum, RIGHT axilla and LEFT axilla, consistent with metastatic disease. 3. Separate pulmonary nodules within the RIGHT lower lobe and RIGHT middle lobe, largest measuring 1.6 cm, compatible with metastatic disease. 4. Diffusely heterogeneous appearance of the liver, highly suspicious for diffuse liver metastases given the corresponding appearance of the liver on earlier ultrasound. 5. Subtle lucencies within the LEFT anterior-lateral fifth and sixth ribs, suspicious for early osseous metastasis. 6. Patchy consolidations within the RIGHT lower lung, most likely atelectasis. Pneumonia is considered less likely. 7. Coronary artery calcifications.  Aortic Atherosclerosis    Patient has no known history of cancer; request  now received for image guided right axillary lymph node biopsy for further evaluation.  He is COVID-19 negative.  Past Medical History:  Diagnosis Date  . Aortic stenosis 04/2012   mild-moderate  . Chronic kidney disease    Stage III  . Coronary artery disease 04/2012   Cardiac cath 04/2012: LAD, 90% proximal, RCA: 100% mid with left to right collaterals, Normal EF, Moderate aortic stenosis (peak -peak gradient of 23 mm Hg), LAD PCI with a DES placment.   . Diverticulitis   . Heart murmur   . Hyperlipidemia   . Hypertension 06/20/2012  . PSVT (paroxysmal supraventricular tachycardia) ([place]) 2018   Status post ablation in March 2018 in [place].  . Substance abuse ([place])    remote  . Tobacco abuse     Past Surgical History:  Procedure Laterality Date  . CARDIAC CATHETERIZATION     x1 stent  . CARDIAC CATHETERIZATION    . CARDIAC CATHETERIZATION    . CARDIAC CATHETERIZATION N/A 01/05/2016   Procedure: Right/Left Heart Cath and Coronary Angiography;  Surgeon: Wellington Hampshire, MD;  Location: [place] [facility];  Service: Cardiovascular;  Laterality: N/A;  . CARDIAC ELECTROPHYSIOLOGY STUDY AND ABLATION    . CORONARY STENT INTERVENTION N/A 09/29/2020   Procedure: CORONARY STENT INTERVENTION;  Surgeon: Wellington Hampshire, MD;  Location: [place] [facility];  Service: Cardiovascular;  Laterality: N/A;  . INTRAVASCULAR ULTRASOUND/IVUS N/A 09/29/2020   Procedure: Intravascular Ultrasound/IVUS;  Surgeon: Wellington Hampshire, MD;  Location: [place] [facility];  Service: Cardiovascular;  Laterality: N/A;  . RIGHT/LEFT HEART CATH AND CORONARY ANGIOGRAPHY N/A 09/29/2020   Procedure: RIGHT/LEFT HEART CATH AND CORONARY ANGIOGRAPHY;  Surgeon: Wellington Hampshire, MD;  Location: [place] [facility];  Service: Cardiovascular;  Laterality: N/A;    Allergies: Atorvastatin  Medications: Prior to Admission medications  Medication Sig Start Date End Date Taking? Authorizing Provider  acetaminophen (TYLENOL)  500 MG tablet Take 1,000 mg by mouth daily.   Yes [provider]  albuterol (VENTOLIN HFA) 108 (90 Base) MCG/ACT inhaler Inhale 2 puffs into the lungs every 6 (six) hours as needed for wheezing or shortness of breath. 10/13/20  Yes Loel Dubonnet, NP  amLODipine (NORVASC) 10 MG tablet Take 1 tablet (10 mg total) by mouth daily. 10/02/20  Yes Reino Bellis B, NP  ascorbic acid (VITAMIN C) 500 MG tablet Take 500 mg by mouth daily.    Yes [provider]  aspirin EC 81 MG tablet Take 81 mg by mouth daily.   Yes [provider]  carvedilol (COREG) 12.5 MG tablet Take 1 tablet (12.5 mg total) by mouth 2 (two) times daily with a meal. 10/01/20  Yes Cheryln Manly, NP  clopidogrel (PLAVIX) 75 MG tablet Take 1 tablet (75 mg total) by mouth daily with breakfast. 10/02/20  Yes Reino Bellis B, NP  latanoprost (XALATAN) 0.005 % ophthalmic solution Place 1 drop into both eyes daily.    Yes [provider]  nitroGLYCERIN (NITROSTAT) 0.4 MG SL tablet Place 0.4 mg under the tongue every 5 (five) minutes as needed for chest pain.   Yes [provider]  ranolazine (RANEXA) 1000 MG SR tablet Take 1 tablet (1,000 mg total) by mouth 2 (two) times daily. 05/18/16  Yes Wellington Hampshire, MD  rosuvastatin (CRESTOR) 20 MG tablet Take 1 tablet (20 mg total) by mouth daily. 12/29/19  Yes Wellington Hampshire, MD  sertraline (ZOLOFT) 50 MG tablet Take 1 tablet (50 mg total) by mouth daily. 10/05/20  Yes Wellington Hampshire, MD  tamsulosin (FLOMAX) 0.4 MG CAPS capsule Take 0.4 mg by mouth daily.   Yes [provider]  traMADol (ULTRAM) 50 MG tablet Take 50 mg by mouth every 6 (six) hours as needed for moderate pain.  05/29/20  Yes [provider]  amiodarone (PACERONE) 200 MG tablet Take 400mg  (2tabs) twice a day for 5 days, then 400mg  daily for 5 days, then 200mg  daily Patient not taking: Reported on 10/15/2020 10/01/20   Reino Bellis B, NP  potassium chloride  SA (KLOR-CON) 20 MEQ tablet Take 59mEq (2 tablets) 3 times on 10/14/20. Patient not taking: Reported on 10/15/2020 10/14/20   Loel Dubonnet, NP     Family History  Problem Relation Age of Onset  . Heart disease Mother        s/p 4 vessel CABG x 2  . Heart disease Father        s/p CABG  . Cancer Father        metastatic prostate CA  . Cancer Brother 43       Lung    Social History   Socioeconomic History  . Marital status: Married    Spouse name: Not on file  . Number of children: Not on file  . Years of education: Not on file  . Highest education level: Not on file  Occupational History  . Not on file  Tobacco Use  . Smoking status: Former Smoker    Packs/day: 3.00    Types: Cigars    Quit date: 09/25/2020    Years since quitting: 0.0  . Smokeless tobacco: Current User  . Tobacco comment: uses [employer]  Vaping Use  . Vaping Use: Never used  Substance and Sexual Activity  . Alcohol use: Yes    Alcohol/week: 1.0  standard drink    Types: 1 Glasses of wine per week    Comment: rare  . Drug use: No  . Sexual activity: Not on file  Other Topics Concern  . Not on file  Social History Narrative  . Not on file   Social Determinants of Health   Financial Resource Strain:   . Difficulty of Paying Living Expenses: Not on file  Food Insecurity:   . Worried About Charity fundraiser in the Last Year: Not on file  . Ran Out of Food in the Last Year: Not on file  Transportation Needs:   . Lack of Transportation (Medical): Not on file  . Lack of Transportation (Non-Medical): Not on file  Physical Activity:   . Days of Exercise per Week: Not on file  . Minutes of Exercise per Session: Not on file  Stress:   . Feeling of Stress : Not on file  Social Connections:   . Frequency of Communication with Friends and Family: Not on file  . Frequency of Social Gatherings with Friends and Family: Not on file  . Attends Religious Services: Not on file  . Active Member of Clubs  or Organizations: Not on file  . Attends Archivist Meetings: Not on file  . Marital Status: Not on file      Review of Systems see above ;currently denies fever, headache, chest pain, abdominal/back pain, nausea, vomiting or bleeding.  Vital Signs: BP (!) 144/69 (BP Location: Left Arm)   Pulse (!) 56   Temp 98.3 F (36.8 C) (Oral)   Resp 20   Ht 6\' 2"  (1.88 m)   Wt 210 lb (95.3 kg)   SpO2 95%   BMI 26.96 kg/m   Physical Exam awake, alert.  Chest with slightly diminished breath sounds bases;   Heart with bradycardic but regular rhythm, positive murmur.  Abdomen soft, positive bowel sounds, nontender.  No lower extremity edema.  Imaging: DG Chest 2 View  Result Date: 10/15/2020 CLINICAL DATA:  Shortness of breath EXAM: CHEST - 2 VIEW COMPARISON:  09/29/2020 FINDINGS: Right hilar fullness, similar to prior study. Cannot exclude adenopathy. Heart is normal size. Bilateral lower lobe airspace opacities are similar to prior study. No effusions. No acute bony abnormality. IMPRESSION: Bilateral lower lobe airspace opacities could reflect atelectasis or infiltrates, stable. Stable right hilar fullness. Cannot exclude adenopathy. This could be further evaluated with chest CT with IV contrast if felt clinically indicated. Electronically Signed   By: Rolm Baptise M.D.   On: 10/15/2020 21:00   DG Chest 2 View  Result Date: 09/29/2020 CLINICAL DATA:  Cough, pulmonary infiltrate EXAM: CHEST - 2 VIEW COMPARISON:  09/27/2020 FINDINGS: Normal heart size and pulmonary vascularity. Prominence of RIGHT hilum. Mediastinal contours otherwise normal. Peribronchial thickening with subsegmental atelectasis at RIGHT base. Scattered interstitial infiltrates RIGHT perihilar to RIGHT base. Remaining lungs clear. No pleural effusion or pneumothorax. IMPRESSION: Persistent RIGHT perihilar/basilar infiltrate and basilar atelectasis. Electronically Signed   By: Lavonia Dana M.D.   On: 09/29/2020 08:28   DG  Chest 2 View  Result Date: 09/27/2020 CLINICAL DATA:  Chest pain EXAM: CHEST - 2 VIEW COMPARISON:  01/01/2012 FINDINGS: Right perihilar infiltrate. No edema, effusion, or pneumothorax. Borderline heart size, likely accentuated by fat pad. Normal aortic contours. Rounded bony density posterior to the sternum on the lateral view is related to the ribs and chronic. IMPRESSION: Right perihilar infiltrate. Followup PA and lateral chest X-ray is recommended in 3-4 weeks to ensure  resolution. Electronically Signed   By: Monte Fantasia M.D.   On: 09/27/2020 07:53   CT CHEST WO CONTRAST  Result Date: 10/16/2020 CLINICAL DATA:  Respiratory failure. Abnormal x-ray with interstitial infiltrates. Recent CHF. EXAM: CT CHEST WITHOUT CONTRAST TECHNIQUE: Multidetector CT imaging of the chest was performed following the standard protocol without IV contrast. COMPARISON:  Chest x-ray dated 10/15/2020. Abdomen ultrasound from same day. FINDINGS: Cardiovascular: Aortic atherosclerosis. Coronary artery calcifications. No pericardial effusion. No thoracic aortic aneurysm. Mediastinum/Nodes: Bulky lymphadenopathy within the mediastinum, including 2.4 cm short axis lymphadenopathy in the RIGHT lower paratracheal space and 2.2 cm short axis lymphadenopathy within the subcarinal space. Additional bulky lymphadenopathy within the RIGHT axilla, measuring 2.3 cm short axis dimension. Moderately prominent lymphadenopathy within the LEFT axilla, including a 1.3 cm short axis lymph node. Esophagus is unremarkable.  Trachea is unremarkable. Lungs/Pleura: RIGHT perihilar mass measures 7.6 x 4.9 cm (series 3, image 81). This is causing mass effect on the proximal bronchi to the RIGHT upper lobe, RIGHT middle lobe and RIGHT lower lobe, but these bronchi remain patent. Separate pulmonary nodule within the RIGHT lower lobe, posterior aspect, measures 1.6 cm (image 83). Additional smaller pulmonary nodule within the RIGHT middle lobe measures 6 mm  (image 88). Patchy consolidations within the RIGHT lower lung, most likely atelectasis. Upper Abdomen: Diffusely heterogeneous appearance of the liver, highly suspicious for metastatic disease given the earlier ultrasound appearance. Probable underlying cirrhosis. Musculoskeletal: No acute appearing osseous abnormality. No convincing evidence of osseous metastasis. Subtle lucency within the LEFT anterior-lateral fifth and sixth ribs is suspicious for early osseous metastasis. IMPRESSION: 1. RIGHT perihilar mass measures 7.6 x 4.9 cm, consistent with primary bronchogenic carcinoma. This is causing mass effect on the proximal bronchi to the RIGHT upper lobe, RIGHT middle lobe and RIGHT lower lobe, but these bronchi remain patent. 2. Bulky lymphadenopathy within the mediastinum, RIGHT axilla and LEFT axilla, consistent with metastatic disease. 3. Separate pulmonary nodules within the RIGHT lower lobe and RIGHT middle lobe, largest measuring 1.6 cm, compatible with metastatic disease. 4. Diffusely heterogeneous appearance of the liver, highly suspicious for diffuse liver metastases given the corresponding appearance of the liver on earlier ultrasound. 5. Subtle lucencies within the LEFT anterior-lateral fifth and sixth ribs, suspicious for early osseous metastasis. 6. Patchy consolidations within the RIGHT lower lung, most likely atelectasis. Pneumonia is considered less likely. 7. Coronary artery calcifications. Aortic Atherosclerosis (ICD10-I70.0). Electronically Signed   By: Franki Cabot M.D.   On: 10/16/2020 04:51   CARDIAC CATHETERIZATION  Addendum Date: 09/29/2020    Dist Cx lesion is 20% stenosed.  Prox RCA lesion is 70% stenosed.  Prox Cx lesion is 80% stenosed.  Post intervention, there is a 10% residual stenosis.  A drug-eluting stent was successfully placed using a STENT RESOLUTE ONYX 4.5X12.  Lat 1st Mrg lesion is 75% stenosed.  Mid Cx lesion is 80% stenosed.  Post intervention, there is a 0%  residual stenosis.  A drug-eluting stent was successfully placed using a STENT RESOLUTE ONYX 4.0X12.  Previously placed Mid LAD stent (unknown type) is widely patent.  Mid RCA lesion is 100% stenosed.  1.  Widely patent LAD stent with no significant restenosis, chronically occluded right coronary artery with left-to-right collaterals, new 80% stenosis in proximal and mid left circumflex. 2.  Left ventricular angiography was not performed due to underlying chronic kidney disease. 3.  Right heart catheterization showed mild to moderate elevation in filling pressures, moderate pulmonary hypertension and normal cardiac output. 4.  Mild gradient across aortic valve.  Evaluating the gradient was difficult due to frequent PVCs. 5.  Successful IVUS guided drug-eluting stent placement to mid and proximal left circumflex. Recommendations: Dual antiplatelet therapy for at least 6 months. Aggressive treatment of risk factors and blood pressure control. I increase carvedilol to 12.5 mg twice daily. The patient continues to be volume overloaded and probably requires 1 more day of IV diuresis.  Monitor renal function closely.   Result Date: 09/29/2020  Dist Cx lesion is 20% stenosed.  Prox RCA lesion is 70% stenosed.  Prox Cx lesion is 80% stenosed.  Post intervention, there is a 10% residual stenosis.  A drug-eluting stent was successfully placed using a STENT RESOLUTE ONYX 4.5X12.  Lat 1st Mrg lesion is 75% stenosed.  Mid Cx lesion is 80% stenosed.  Post intervention, there is a 0% residual stenosis.  A drug-eluting stent was successfully placed using a STENT RESOLUTE ONYX 4.0X12.  Previously placed Mid LAD stent (unknown type) is widely patent.  Mid RCA lesion is 100% stenosed.  1.  Widely patent LAD stent with no significant restenosis, chronically occluded right coronary artery with left-to-right collaterals, new 80% stenosis in proximal and mid left circumflex. 2.  Left ventricular angiography was not  performed due to underlying chronic kidney disease. 3.  Right heart catheterization showed mild to moderate elevation in filling pressures, moderate pulmonary hypertension and normal cardiac output. 4.  Successful IVUS guided drug-eluting stent placement to mid and proximal left circumflex. Recommendations: Dual antiplatelet therapy for at least 6 months. Aggressive treatment of risk factors and blood pressure control. I increase carvedilol to 12.5 mg twice daily. The patient continues to be volume overloaded and probably requires 1 more day of IV diuresis.  Monitor renal function closely.   ECHOCARDIOGRAM COMPLETE  Result Date: 09/28/2020    ECHOCARDIOGRAM REPORT   Patient Name:   JOZEPH PERSING Date of Exam: 09/28/2020 Medical Rec #:  [id number]           Height:       74.0 in Accession #:    [id number]          Weight:       230.0 lb Date of Birth:  [birth year]            BSA:          2.306 m Patient Age:    52 years            BP:           162/68 mmHg Patient Gender: M                   HR:           71 bpm. Exam Location:  Inpatient Procedure: 2D Echo, Cardiac Doppler and Color Doppler Indications:    Aortic Stenosis 424.1 / 135.0  History:        Patient has prior history of Echocardiogram examinations, most                 recent 10/02/2019. CAD, Aortic Valve Disease; Risk                 Factors:Hypertension, Dyslipidemia and Current Smoker.  Sonographer:    Vickie Epley RDCS Referring Phys: [id number] [facility] IMPRESSIONS  1. Left ventricular ejection fraction, by estimation, is 55 to 60%. The left ventricle has normal function. The left ventricle has no regional wall motion abnormalities. The left ventricular internal cavity size  was moderately dilated. Left ventricular diastolic parameters are consistent with Grade II diastolic dysfunction (pseudonormalization).  2. Right ventricular systolic function is normal. The right ventricular size is normal. There is normal pulmonary artery systolic  pressure.  3. Right atrial size was severely dilated.  4. The mitral valve is normal in structure. Trivial mitral valve regurgitation. No evidence of mitral stenosis.  5. The aortic valve is normal in structure. There is mild calcification of the aortic valve. There is mild thickening of the aortic valve. Aortic valve regurgitation is moderate. Mild aortic valve stenosis. Aortic valve area, by VTI measures 1.00 cm. Aortic valve mean gradient measures 14.5 mmHg. Aortic valve Vmax measures 2.47 m/s.  6. The inferior vena cava is dilated in size with <50% respiratory variability, suggesting right atrial pressure of 15 mmHg. FINDINGS  Left Ventricle: Left ventricular ejection fraction, by estimation, is 55 to 60%. The left ventricle has normal function. The left ventricle has no regional wall motion abnormalities. The left ventricular internal cavity size was moderately dilated. There is no left ventricular hypertrophy. Left ventricular diastolic parameters are consistent with Grade II diastolic dysfunction (pseudonormalization). Right Ventricle: The right ventricular size is normal. No increase in right ventricular wall thickness. Right ventricular systolic function is normal. There is normal pulmonary artery systolic pressure. The tricuspid regurgitant velocity is 1.73 m/s, and  with an assumed right atrial pressure of 15 mmHg, the estimated right ventricular systolic pressure is 87.5 mmHg. Left Atrium: Left atrial size was normal in size. Right Atrium: Right atrial size was severely dilated. Pericardium: There is no evidence of pericardial effusion. Mitral Valve: The mitral valve is normal in structure. Trivial mitral valve regurgitation. No evidence of mitral valve stenosis. Tricuspid Valve: The tricuspid valve is normal in structure. Tricuspid valve regurgitation is trivial. No evidence of tricuspid stenosis. Aortic Valve: The aortic valve is normal in structure. There is mild calcification of the aortic valve.  There is mild thickening of the aortic valve. Aortic valve regurgitation is moderate. Mild aortic stenosis is present. Aortic valve mean gradient measures 14.5 mmHg. Aortic valve peak gradient measures 24.4 mmHg. Aortic valve area, by VTI measures 1.00 cm. Pulmonic Valve: The pulmonic valve was normal in structure. Pulmonic valve regurgitation is not visualized. No evidence of pulmonic stenosis. Aorta: The aortic root is normal in size and structure. Venous: The inferior vena cava is dilated in size with less than 50% respiratory variability, suggesting right atrial pressure of 15 mmHg. IAS/Shunts: No atrial level shunt detected by color flow Doppler.  LEFT VENTRICLE PLAX 2D LVIDd:         6.40 cm LVIDs:         4.20 cm LV PW:         1.00 cm LV IVS:        1.00 cm LVOT diam:     2.00 cm LV SV:         51 LV SV Index:   22 LVOT Area:     3.14 cm  LV Volumes (MOD) LV vol d, MOD A2C: 184.0 ml LV vol d, MOD A4C: 110.0 ml LV vol s, MOD A2C: 75.4 ml LV vol s, MOD A4C: 49.9 ml LV SV MOD A2C:     108.6 ml LV SV MOD A4C:     110.0 ml LV SV MOD BP:      93.6 ml RIGHT VENTRICLE TAPSE (M-mode): 2.2 cm LEFT ATRIUM             Index  RIGHT ATRIUM           Index LA diam:        4.80 cm 2.08 cm/m  RA Area:     30.90 cm LA Vol (A2C):   68.4 ml 29.66 ml/m RA Volume:   113.00 ml 48.99 ml/m LA Vol (A4C):   51.0 ml 22.11 ml/m LA Biplane Vol: 62.9 ml 27.27 ml/m  AORTIC VALVE AV Area (Vmax):    0.95 cm AV Area (Vmean):   0.95 cm AV Area (VTI):     1.00 cm AV Vmax:           247.00 cm/s AV Vmean:          165.500 cm/s AV VTI:            0.512 m AV Peak Grad:      24.4 mmHg AV Mean Grad:      14.5 mmHg LVOT Vmax:         74.40 cm/s LVOT Vmean:        50.300 cm/s LVOT VTI:          0.162 m LVOT/AV VTI ratio: 0.32  AORTA Ao Root diam: 3.30 cm TRICUSPID VALVE TR Peak grad:   12.0 mmHg TR Vmax:        173.00 cm/s  SHUNTS Systemic VTI:  0.16 m Systemic Diam: 2.00 cm Skeet Latch MD Electronically signed by Skeet Latch  MD Signature Date/Time: 09/28/2020/11:56:52 AM    Final    VAS US RENAL ARTERY DUPLEX  Result Date: 10/01/2020 ABDOMINAL VISCERAL Indications: Hypertension, CRD- stage 3, GFR 30-59 ml/min. Limitations: Air/bowel gas, obesity and patient discomfort. Comparison Study: No prior study Performing Technologist: Maudry Mayhew MHA, RDMS, RVT, RDCS  Examination Guidelines: A complete evaluation includes B-mode imaging, spectral Doppler, color Doppler, and power Doppler as needed of all accessible portions of each vessel. Bilateral testing is considered an integral part of a complete examination. Limited examinations for reoccurring indications may be performed as noted.  Duplex Findings: +--------------------+--------+--------+------+------------------+ Mesenteric          PSV cm/sEDV cm/sPlaque     Comments      +--------------------+--------+--------+------+------------------+ Aorta Prox            188      43                            +--------------------+--------+--------+------+------------------+ Celiac Artery Origin  124                                    +--------------------+--------+--------+------+------------------+ SMA Proximal                              Unable to insonate +--------------------+--------+--------+------+------------------+    +------------------+--------+--------+-------+ Right Renal ArteryPSV cm/sEDV cm/sComment +------------------+--------+--------+-------+ Origin               59      14           +------------------+--------+--------+-------+ Proximal            148      36           +------------------+--------+--------+-------+ Mid                 151      35           +------------------+--------+--------+-------+  Distal              167      30           +------------------+--------+--------+-------+ +-----------------+--------+--------+-------+ Left Renal ArteryPSV cm/sEDV cm/sComment  +-----------------+--------+--------+-------+ Origin             216      52           +-----------------+--------+--------+-------+ Proximal            46      9            +-----------------+--------+--------+-------+ Mid                 39      8            +-----------------+--------+--------+-------+ Distal              40      7            +-----------------+--------+--------+-------+ +------------+--------+--------+----+-----------+--------+--------+----+ Right KidneyPSV cm/sEDV cm/sRI  Left KidneyPSV cm/sEDV cm/sRI   +------------+--------+--------+----+-----------+--------+--------+----+ Upper Pole  21      5       0.75Upper Pole 24      4       0.82 +------------+--------+--------+----+-----------+--------+--------+----+ Mid         36      8       0.77Mid        23      5       0.76 +------------+--------+--------+----+-----------+--------+--------+----+ Lower Pole  14      4       0.70Lower Pole 30      7       0.77 +------------+--------+--------+----+-----------+--------+--------+----+ Hilar       -37     -8      0.78Hilar      -20     -4      0.79 +------------+--------+--------+----+-----------+--------+--------+----+ +------------------+---------+------------------+---------+ Right Kidney               Left Kidney                 +------------------+---------+------------------+---------+ RAR                        RAR                         +------------------+---------+------------------+---------+ RAR (manual)      0.89     RAR (manual)      0.87      +------------------+---------+------------------+---------+ Cortex            17/2 cm/sCortex            22/5 cm/s +------------------+---------+------------------+---------+ Cortex thickness           Corex thickness             +------------------+---------+------------------+---------+ Kidney length (cm)10.20    Kidney length (cm)10.20      +------------------+---------+------------------+---------+  Summary: Renal:  Right: 1-59% stenosis of the right renal artery. Cyst(s) noted. RRV        flow present. Left:  1-59% stenosis of the left renal artery. LRV flow present. Mesenteric: Normal Celiac artery findings.  Note: Renal/aortic ratio may be falsely diminished due to elevated aorta velocities.  *See table(s) above for measurements and observations.  Diagnosing physician: Servando Snare MD  Electronically signed by Servando Snare MD on 10/01/2020 at 5:34:35 PM.    Final  US Abdomen Limited RUQ (LIVER/GB)  Result Date: 10/16/2020 CLINICAL DATA:  Hepatitis. EXAM: ULTRASOUND ABDOMEN LIMITED RIGHT UPPER QUADRANT COMPARISON:  None. FINDINGS: Gallbladder: No gallstones or wall thickening visualized. No sonographic Murphy sign noted by sonographer. Common bile duct: Diameter: 5 mm Liver: Diffusely heterogeneous parenchyma liver is nearly completely replaced by nodular echogenic areas of/masses, most confluent/discrete measuring approximately 1 cm greatest dimension. Portal vein is patent on color Doppler imaging with normal direction of blood flow towards the liver. Other: Portal vein measures 1.9 cm diameter. IMPRESSION: 1. Liver parenchyma is nearly completely replaced throughout by nodular echogenic mass-like foci. These are suspicious for metastatic disease, alternatively markedly prominent regenerating nodules. Recommend liver MRI for further characterization. 2. 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 Portal vein is patent with appropriate hepatopetal direction of blood flow. 3. Gallbladder appears normal. Electronically Signed   By: Franki Cabot M.D.   On: 10/16/2020 04:26    Labs:  CBC: Recent Labs    09/29/20 0648 09/29/20 1448 09/30/20 0440 10/13/20 1517 10/15/20 2041 10/16/20 0313  WBC 11.2*  --  11.2* 14.0* 13.2*  --   HGB 14.9   < > 14.2 15.4 14.8 12.6*  HCT 45.5   < > 43.9 44.1 43.1  37.0*  PLT 171  --  162 131* 133*  --    < > = values in this interval not displayed.    COAGS: Recent Labs    10/15/20 2041  INR 1.0  APTT 25    BMP: Recent Labs    09/27/20 0745 09/27/20 0745 09/28/20 0521 09/29/20 6967 10/06/20 1315 10/06/20 1315 10/13/20 1517 10/15/20 2041 10/16/20 0313 10/16/20 0456  NA 141   < > 144   < > 141   < > 141 140 136 138  K 3.7   < > 3.5   < > 2.6*   < > 2.1* 2.7* 2.3* 2.4*  CL 104   < > 103   < > 87*  --  79* 81*  --  84*  CO2 25   < > 30   < > 38*  --  45* 45*  --  40*  GLUCOSE 137*   < > 128*   < > 117*  --  112* 144*  --  116*  BUN 27*   < > 27*   < > 40*  --  41* 38*  --  35*  CALCIUM 8.9   < > 8.6*   < > 8.9  --  9.0 9.0  --  8.3*  CREATININE 1.65*   < > 1.51*   < > 1.50*  --  2.03* 1.91*  --  1.76*  GFRNONAA 41*   < > 46*   < > 46*  --  32* 37*  --  41*  GFRAA 48*  --  53*  --  54*  --  37*  --   --   --    < > = values in this interval not displayed.    LIVER FUNCTION TESTS: Recent Labs    10/01/20 0142 10/15/20 2041 10/16/20 0456  BILITOT 1.0 1.7* 1.7*  1.7*  AST 41 195* 156*  157*  ALT 56* 433* 338*  337*  ALKPHOS 73 367* 306*  313*  PROT 5.2* 6.0* 5.3*  5.4*  ALBUMIN 3.2* 3.6 3.1*  3.2*    TUMOR MARKERS: No results for input(s): AFPTM, CEA, CA199, CHROMGRNA in the last 8760 hours.  Assessment  and Plan: 69 y.o. male with multiple medical problems including aortic stenosis, chronic kidney disease, coronary artery disease with recent stenting on 09/29/2020 and currently on Plavix, diverticular disease, hyperlipidemia, hypertension, PSVT with prior ablation in 2018, remote substance abuse, prior tobacco abuse, who presented to [facility] on 10/22 with fatigue, persistent dyspnea, cough and reported weight loss.  He was found to be hypoxic requiring supplemental O2, with markedly elevated transaminases, leukocytosis, and hypokalemia.  Subsequent imaging revealed:  1. RIGHT perihilar mass measures 7.6 x 4.9  cm, consistent with primary bronchogenic carcinoma. This is causing mass effect on the proximal bronchi to the RIGHT upper lobe, RIGHT middle lobe and RIGHT lower lobe, but these bronchi remain patent. 2. Bulky lymphadenopathy within the mediastinum, RIGHT axilla and LEFT axilla, consistent with metastatic disease. 3. Separate pulmonary nodules within the RIGHT lower lobe and RIGHT middle lobe, largest measuring 1.6 cm, compatible with metastatic disease. 4. Diffusely heterogeneous appearance of the liver, highly suspicious for diffuse liver metastases given the corresponding appearance of the liver on earlier ultrasound. 5. Subtle lucencies within the LEFT anterior-lateral fifth and sixth ribs, suspicious for early osseous metastasis. 6. Patchy consolidations within the RIGHT lower lung, most likely atelectasis. Pneumonia is considered less likely. 7. Coronary artery calcifications.  Aortic Atherosclerosis    Patient has no known history of cancer; request now received for image guided right axillary lymph node biopsy for further evaluation.  He is COVID-19 negative.  Imaging studies reviewed by Dr. Vernard Gambles.  Plan at this time is for US guided right axillary lymph node biopsy on 10/25.Risks and benefits of procedure was discussed with the patient and/or patient's family including, but not limited to bleeding, infection, damage to adjacent structures or low yield requiring additional tests.  All of the questions were answered and there is agreement to proceed.  Consent signed and in chart.  Antiplatelet therapy will not be stopped for biopsy due to recent coronary stenting    Thank you for this interesting consult.  I greatly enjoyed meeting Blessed Cotham and look forward to participating in their care.  A copy of this report was sent to the requesting provider on this date.  Electronically Signed: D. Rowe Robert, PA-C 10/16/2020, 1:22 PM   I spent a total of 25 minutes    in face to face in clinical consultation, greater than 50% of which was counseling/coordinating care for ultrasound-guided right axillary lymph node biopsy

## 2020-10-16 NOTE — Plan of Care (Signed)
  Problem: Coping: Goal: Level of anxiety will decrease Outcome: Progressing   Problem: Safety: Goal: Ability to remain free from injury will improve Outcome: Progressing   Problem: Pain Managment: Goal: General experience of comfort will improve Outcome: Progressing   Problem: Skin Integrity: Goal: Risk for impaired skin integrity will decrease Outcome: Progressing   Problem: Education: Goal: Knowledge of General Education information will improve Description: Including pain rating scale, medication(s)/side effects and non-pharmacologic comfort measures Outcome: Progressing   Problem: Clinical Measurements: Goal: Will remain free from infection Outcome: Progressing

## 2020-10-16 NOTE — ED Notes (Signed)
Report given to 3E 

## 2020-10-16 NOTE — Progress Notes (Signed)
Consult rec'd  LFT abnormality cause now seen from metastatic malignancy of unknown primary.  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  Message Dr. Sloan Leiter to offer consult if needed, but don't think I can add any more now.   He replied that he will call GI service back if needed.  - H. Danis

## 2020-10-16 NOTE — Progress Notes (Signed)
ABG collected and critical values given to the MD.

## 2020-10-16 NOTE — ED Notes (Signed)
Attempted report x1, nurse unavailable at this time.

## 2020-10-17 DIAGNOSIS — B179 Acute viral hepatitis, unspecified: Secondary | ICD-10-CM | POA: Diagnosis not present

## 2020-10-17 LAB — HEPATITIS PANEL, ACUTE
HCV Ab: NONREACTIVE
Hep A IgM: NONREACTIVE
Hep B C IgM: NONREACTIVE
Hepatitis B Surface Ag: NONREACTIVE

## 2020-10-17 LAB — COMPREHENSIVE METABOLIC PANEL
ALT: 335 U/L — ABNORMAL HIGH (ref 0–44)
AST: 136 U/L — ABNORMAL HIGH (ref 15–41)
Albumin: 2.9 g/dL — ABNORMAL LOW (ref 3.5–5.0)
Alkaline Phosphatase: 304 U/L — ABNORMAL HIGH (ref 38–126)
Anion gap: 11 (ref 5–15)
BUN: 29 mg/dL — ABNORMAL HIGH (ref 8–23)
CO2: 40 mmol/L — ABNORMAL HIGH (ref 22–32)
Calcium: 8.4 mg/dL — ABNORMAL LOW (ref 8.9–10.3)
Chloride: 91 mmol/L — ABNORMAL LOW (ref 98–111)
Creatinine, Ser: 1.58 mg/dL — ABNORMAL HIGH (ref 0.61–1.24)
GFR, Estimated: 47 mL/min — ABNORMAL LOW (ref >60)
Glucose, Bld: 112 mg/dL — ABNORMAL HIGH (ref 70–99)
Potassium: 2.8 mmol/L — ABNORMAL LOW (ref 3.5–5.1)
Sodium: 142 mmol/L (ref 135–145)
Total Bilirubin: 1.4 mg/dL — ABNORMAL HIGH (ref 0.3–1.2)
Total Protein: 5.1 g/dL — ABNORMAL LOW (ref 6.5–8.1)

## 2020-10-17 LAB — ACETAMINOPHEN LEVEL: Acetaminophen (Tylenol), Serum: <10 ug/mL — ABNORMAL LOW (ref 10–30)

## 2020-10-17 LAB — PROCALCITONIN: Procalcitonin: 6.63 ng/mL

## 2020-10-17 LAB — ETHANOL: Alcohol, Ethyl (B): <10 mg/dL (ref <10)

## 2020-10-17 LAB — C-REACTIVE PROTEIN: CRP: <0.5 mg/dL (ref <1.0)

## 2020-10-17 LAB — TSH: TSH: 1.678 u[IU]/mL (ref 0.350–4.500)

## 2020-10-17 LAB — POTASSIUM: Potassium: 3.4 mmol/L — ABNORMAL LOW (ref 3.5–5.1)

## 2020-10-17 LAB — MAGNESIUM: Magnesium: 2.4 mg/dL (ref 1.7–2.4)

## 2020-10-17 MED ORDER — POTASSIUM CHLORIDE CRYS ER 20 MEQ PO TBCR
40.0000 meq | EXTENDED_RELEASE_TABLET | Freq: Two times a day (BID) | ORAL | Status: AC
  Administered 2020-10-17 (×2): 40 meq via ORAL
  Filled 2020-10-17 (×2): qty 2

## 2020-10-17 MED ORDER — HYDRALAZINE HCL 50 MG PO TABS
50.0000 mg | ORAL_TABLET | Freq: Three times a day (TID) | ORAL | Status: DC
  Administered 2020-10-17 – 2020-10-19 (×6): 50 mg via ORAL
  Filled 2020-10-17 (×6): qty 1

## 2020-10-17 MED ORDER — POTASSIUM CHLORIDE 10 MEQ/100ML IV SOLN
10.0000 meq | INTRAVENOUS | Status: AC
  Administered 2020-10-17 (×4): 10 meq via INTRAVENOUS
  Filled 2020-10-17 (×4): qty 100

## 2020-10-17 MED ORDER — HYDRALAZINE HCL 50 MG PO TABS: 50.0000 mg | ORAL_TABLET | Freq: Three times a day (TID) | ORAL | Status: DC

## 2020-10-17 NOTE — Plan of Care (Signed)
  Problem: Skin Integrity: Goal: Risk for impaired skin integrity will decrease Outcome: Progressing   Problem: Safety: Goal: Ability to remain free from injury will improve Outcome: Progressing   Problem: Pain Managment: Goal: General experience of comfort will improve Outcome: Progressing   

## 2020-10-17 NOTE — Evaluation (Signed)
Physical Therapy Evaluation Patient Details Name: Chase Barton MRN: [id number] DOB: [birth year] Today's Date: 10/17/2020   History of Present Illness  69 y.o. male with history of tobacco use (quit earlier this month), CAD-s/p PCI to LAD in 2013-subsequent PCI to RCA/LCx on 09/29/2020-presenting with exertional dyspnea, fatigue, 20-30 pound weight loss-outpatient lab work-up showed AKI, elevated LFTs-found to have acute hypoxic respiratory failure requiring O2 supplementation in the emergency room. Further imaging suggestive of metastatic lung cancer resulting in admission. Lung mass along with mets to liver, bone, mediastinum.  Clinical Impression  Pt presents to PT with deficits in functional mobility, gait, balance, endurance, power, and cardiopulmonary function. Pt mobilizing with limited assistance from PT. PT providing cues during transfers, especially from commode to utilize rails from low toilet. Pt does desat to 88% on RA with ambulation but sats remain stable on RA at rest. Pt will benefit from continued acute PT POC to assess gait with cane as pt has been ambulating with a cane for the past 2 weeks. PT recommends discharge home with HHPT and a RW at this time.    Follow Up Recommendations Home health PT;Supervision - Intermittent    Equipment Recommendations  Rolling walker with 5" wheels    Recommendations for Other Services       Precautions / Restrictions Precautions Precautions: Fall Restrictions Weight Bearing Restrictions: No      Mobility  Bed Mobility Overal bed mobility: Needs Assistance Bed Mobility: Sit to Supine       Sit to supine: Supervision        Transfers Overall transfer level: Needs assistance Equipment used: Rolling walker (2 wheeled) Transfers: Sit to/from [employer] Transfers Sit to Stand: Supervision Stand pivot transfers: Supervision       General transfer comment: cues for use of rails with transfer from  commode  Ambulation/Gait Ambulation/Gait assistance: Supervision Gait Distance (Feet): 100 Feet Assistive device: Rolling walker (2 wheeled) Gait Pattern/deviations: Step-through pattern Gait velocity: reduced Gait velocity interpretation: <1.8 ft/sec, indicate of risk for recurrent falls General Gait Details: pt with slowed but steady step through gait, reduced step length near end of walk  Stairs            Wheelchair Mobility    Modified Rankin (Stroke Patients Only)       Balance Overall balance assessment: Needs assistance Sitting-balance support: No upper extremity supported;Feet supported Sitting balance-Leahy Scale: Good     Standing balance support: No upper extremity supported;During functional activity Standing balance-Leahy Scale: Fair Standing balance comment: supervision for static standing                             Pertinent Vitals/Pain Pain Assessment: No/denies pain    Home Living Family/patient expects to be discharged to:: Private residence Living Arrangements: Spouse/significant other Available Help at Discharge: Family;Available PRN/intermittently (spouse and dtr) Type of Home: House Home Access: Level entry     Home Layout: One level Home Equipment: Cane - single point;Bedside commode;Shower seat      Prior Function Level of Independence: Independent with assistive device(s)         Comments: pt has been ambulating with cane for the past 2 weeks     Hand Dominance   Dominant Hand: Right    Extremity/Trunk Assessment   Upper Extremity Assessment Upper Extremity Assessment: Overall WFL for tasks assessed    Lower Extremity Assessment Lower Extremity Assessment: RLE deficits/detail;LLE deficits/detail RLE Sensation: decreased  light touch LLE Sensation: decreased light touch    Cervical / Trunk Assessment Cervical / Trunk Assessment: Normal  Communication   Communication: No difficulties  Cognition  Arousal/Alertness: Awake/alert Behavior During Therapy: WFL for tasks assessed/performed Overall Cognitive Status: Within Functional Limits for tasks assessed                                        General Comments General comments (skin integrity, edema, etc.): pt on 3L [place] upon arrival, weaned to RA for ambulation. With activity pt desats to 88% on RA near end of walk. When seated or resting pt sats in low 90s on RA. Pt requests to be left on 2L  at end of session despite goof resting sats on RA.    Exercises     Assessment/Plan    PT Assessment Patient needs continued PT services  PT Problem List Decreased activity tolerance;Decreased balance;Decreased mobility;Cardiopulmonary status limiting activity;Impaired sensation       PT Treatment Interventions DME instruction;Gait training;Functional mobility training;Balance training;Neuromuscular re-education;Patient/family education;Therapeutic activities;Therapeutic exercise    PT Goals (Current goals can be found in the Care Plan section)  Acute Rehab PT Goals Patient Stated Goal: To return home PT Goal Formulation: With patient/family Time For Goal Achievement: 10/31/20 Potential to Achieve Goals: Good Additional Goals Additional Goal #1: Pt will score>19/25 on DGI to indicate a reduced risk of falls    Frequency Min 3X/week   Barriers to discharge        Co-evaluation               AM-PAC PT "6 Clicks" Mobility  Outcome Measure Help needed turning from your back to your side while in a flat bed without using bedrails?: None Help needed moving from lying on your back to sitting on the side of a flat bed without using bedrails?: None Help needed moving to and from a bed to a chair (including a wheelchair)?: None Help needed standing up from a chair using your arms (e.g., wheelchair or bedside chair)?: None Help needed to walk in hospital room?: None Help needed climbing 3-5 steps with a railing? : A  Little 6 Click Score: 23    End of Session Equipment Utilized During Treatment: Oxygen Activity Tolerance: Patient tolerated treatment well Patient left: in bed;with call bell/phone within reach;with family/visitor present Nurse Communication: Mobility status PT Visit Diagnosis: Other abnormalities of gait and mobility (R26.89);Other symptoms and signs involving the nervous system (R29.898)    Time: 0174-9449 PT Time Calculation (min) (ACUTE ONLY): 21 min   Charges:   PT Evaluation $PT Eval Moderate Complexity: 1 Mod          Zenaida Niece, PT, DPT Acute Rehabilitation Pager: [phone]   Zenaida Niece 10/17/2020, 5:30 PM

## 2020-10-17 NOTE — Progress Notes (Addendum)
Patient is weighing 215.8 Lbs this morning.  Reading from yesterday upon admission was 201.1 lbs.   I have spoken to the Daughter(Lora) and Verified that Her dad's weight on Friday 10/15/2020  was 210 lbs. Therefore, We are going to use 215.8lbs as baseline.

## 2020-10-17 NOTE — Progress Notes (Signed)
PROGRESS NOTE        PATIENT DETAILS Name: Chase Barton Age: 69 y.o. Sex: male Date of Birth: [birth year] Admit Date: 10/15/2020 Admitting Physician Vernelle Emerald, MD JSE:[id number], Clent Ridges, MD  Brief Narrative: Patient is a 69 y.o. male with history of tobacco use (quit earlier this month), CAD-s/p PCI to LAD in 2013-subsequent PCI to RCA/LCx on 09/29/2020-presenting with exertional dyspnea, fatigue, 20-30 pound weight loss-outpatient lab work-up showed AKI, elevated LFTs-found to have acute hypoxic respiratory failure requiring O2 supplementation in the emergency room-subsequent imaging studies done in the emergency room suggestive of metastatic lung cancer-admitted to the hospitalist service for further evaluation and treatment.  Significant events: 10/4-10/8>> hospitalization under cardiology service-decompensated diastolic heart failure-s/p PCI to RCA/circumflex on 10/6 10/22>> admit for acute hypoxic respiratory failure, AKI, elevated LFTs-imaging studies suggestive of metastatic lung cancer  Significant studies: 10/5>> Echo: EF 76-16%, grade 2 diastolic dysfunction 07/37>> RUQ ultrasound: Liver parenchyma completely replaced by nodular echogenic mass-suspicious for metastatic disease, portal vein dilatation suggestive of portal venous hypertension. 10/23>> CT chest: Right perihilar mass, mediastinal adenopathy, right/left axillary adenopathy, left 6/8 rib mets, diffuse liver metastases  Antimicrobial therapy: None  Microbiology data: 10/23>> urine culture: Pending  Procedures : None  Consults: Phone consult-cardiology-Dr. Thompson Grayer on 10/23 IR  DVT Prophylaxis : enoxaparin (LOVENOX) injection 40 mg Start: 10/16/20 1000  Subjective:  Patient in bed, appears comfortable, denies any headache, no fever, no chest pain or pressure, no shortness of breath , no abdominal pain. No focal weakness.   Assessment/Plan:  Acute hypoxic  respiratory failure: Secondary to lung mass-possible postobstructive pneumonia-given significantly elevated procalcitonin-start Unasyn per pharmacy.  Does not appear to be volume overloaded.  Follow.  AKI on CKD stage IIIb: AKI likely hemodynamically mediated-secondary diuretics/poor oral intake in the setting of metastatic lung cancer.  Does have proteinuria-could have membranous nephropathy due to lung cancer.  Renal function improving with supportive care-doubt further work-up will change outcome and management.  Avoid nephrotoxic agents.  Elevated liver enzymes: Multifactorial-secondary to amiodarone use-replacement of liver by med/tumor.  Stopped amiodarone-continue supportive care-avoid hepatotoxic agents-liver enzymes slowly downtrending.  Hypokalemia: Replaced IV and oral, will monitor.   Newly diagnosed/probable metastatic lung CA: History of significant tobacco use-CT chest with what appears to be lung mass with mets to liver/bone/mediastinum-high suspicion for bronchogenic carcinoma.  Difficult situation-just had PCI to RCA/LCx on 10/6- done by Dr Fletcher Anon - dual antiplatelet therapy cannot be stopped for at least 1 month and preferably 3 months.  IR to try and do axillary node biopsy as the first step if fails then transjugular liver biopsy.  IR following.  Oncology outpatient follow-up post biopsy.   Chronic diastolic heart failure, EF 55% on echocardiogram done 09/28/2020: Euvolemic on exam-acknowledges significant improvement in volume status-apparently did have significant ankle edema during his most recent hospitalization.  Currently Lasix as needed.  Continue beta-blocker.  CAD s/p PCI to LAD 2013-and PCI to RCA/LCx 09/29/2020: Continue aspirin/Plavix/beta-blocker/Ranexa-statin on hold due to elevated LFTs.  See above discussion regarding dual antiplatelet agents.  History of PVCs/bigeminy: Placed on suppressive amiodarone last admission-have discontinued due to elevated LFTs-follow  telemetry and optimize beta-blocker regimen accordingly.  HTN: Blood pressure elevated on combination of Coreg and Norvasc, added hydralazine.  COPD: No flare-continue bronchodilators  BPH: Continue Flomax   Recent Labs  Lab 10/13/20 1517 10/15/20 2041  10/15/20 2338 10/16/20 0014 10/16/20 0313 10/16/20 0456 10/17/20 0446  WBC 14.0* 13.2*  --   --   --   --   --   HGB 15.4 14.8  --   --  12.6*  --   --   HCT 44.1 43.1  --   --  37.0*  --   --   PLT 131* 133*  --   --   --   --   --   CRP  --   --   --   --   --   --  <0.5  BNP  --  282.7*  --   --   --   --   --   DDIMER  --   --  0.81*  --   --   --   --   PROCALCITON  --   --   --   --   --  9.57 6.63  AST  --  195*  --   --   --  156*  157* 136*  ALT  --  433*  --   --   --  338*  337* 335*  ALKPHOS  --  367*  --   --   --  306*  313* 304*  BILITOT  --  1.7*  --   --   --  1.7*  1.7* 1.4*  ALBUMIN  --  3.6  --   --   --  3.1*  3.2* 2.9*  INR  --  1.0  --   --   --   --   --   SARSCOV2NAA  --   --   --  NEGATIVE  --   --   --       Diet: Diet Order            Diet NPO time specified Except for: Sips with Meds  Diet effective ____           Diet Heart Room service appropriate? Yes; Fluid consistency: Thin  Diet effective now                  Code Status: Full code   Family Communication:  Spoke with daughter Zacarias Pontes who is a HHRN-tel [phone]) over wife's phone, wife bedside on 10/17/2020.    Disposition Plan: Status is: Inpt  The patient will require care spanning > 2 midnights and should be moved to inpatient because: Inpatient level of care appropriate due to severity of illness  Dispo: The patient is from: Home              Anticipated d/c is to: Home              Anticipated d/c date is: 2 days              Patient currently is not medically stable to d/c.  Barriers to Discharge: Hypokalemia/AKI/hepatitis-requiring IV fluids/IV potassium supplementation-postobstructive pneumonia-starting  IV antibiotics, biopsy to rule out malignancy pending.  Antimicrobial agents: Anti-infectives (From admission, onward)   Start     Dose/Rate Route Frequency Ordered Stop   10/16/20 1115  Ampicillin-Sulbactam (UNASYN) 3 g in sodium chloride 0.9 % 100 mL IVPB        3 g 200 mL/hr over 30 Minutes Intravenous Every 6 hours 10/16/20 1101         Time spent: 35 minutes-Greater than 50% of this time was spent in counseling, explanation of diagnosis, planning of further  management, and coordination of care.  MEDICATIONS: Scheduled Meds: . amLODipine  10 mg Oral Daily  . ascorbic acid  500 mg Oral Daily  . aspirin EC  81 mg Oral Daily  . carvedilol  12.5 mg Oral BID WC  . clopidogrel  75 mg Oral Q breakfast  . enoxaparin (LOVENOX) injection  40 mg Subcutaneous Daily  . hydrALAZINE  50 mg Oral Q8H  . ipratropium-albuterol  3 mL Nebulization TID  . latanoprost  1 drop Both Eyes Daily  . potassium chloride  40 mEq Oral BID  . ranolazine  1,000 mg Oral BID  . sertraline  50 mg Oral Daily  . tamsulosin  0.4 mg Oral Daily   Continuous Infusions: . ampicillin-sulbactam (UNASYN) IV 3 g (10/17/20 0547)  . potassium chloride 10 mEq (10/17/20 0910)   PRN Meds:.ipratropium-albuterol, nitroGLYCERIN, [DISCONTINUED] ondansetron **OR** ondansetron (ZOFRAN) IV, polyethylene glycol, traMADol   PHYSICAL EXAM: Vital signs: Vitals:   10/16/20 2200 10/17/20 0100 10/17/20 0522 10/17/20 0739  BP: (!) 145/73 (!) 126/48 (!) 158/69   Pulse:  (!) 52 (!) 57 (!) 58  Resp: 16 16 20 14   Temp:  98.6 F (37 C) 98.8 F (37.1 C)   TempSrc:  Oral Oral   SpO2: 97% 96% 93% 95%  Weight:   98.1 kg   Height:       Filed Weights   10/15/20 2023 10/16/20 1357 10/17/20 0522  Weight: 95.3 kg 91.4 kg 98.1 kg   Body mass index is 27.77 kg/m.   Gen Exam:  Awake Alert, No new F.N deficits, Normal affect [place].AT,PERRAL Supple Neck,No JVD, No cervical lymphadenopathy appriciated.  Symmetrical Chest wall movement,  Good air movement bilaterally, CTAB RRR,No Gallops, Rubs or new Murmurs, No Parasternal Heave +ve B.Sounds, Abd Soft, No tenderness, No organomegaly appriciated, No rebound - guarding or rigidity. No Cyanosis, Clubbing or edema, No new Rash or bruise   I have personally reviewed following labs and imaging studies  LABORATORY DATA: CBC: Recent Labs  Lab 10/13/20 1517 10/15/20 2041 10/16/20 0313  WBC 14.0* 13.2*  --   NEUTROABS  --  11.8*  --   HGB 15.4 14.8 12.6*  HCT 44.1 43.1 37.0*  MCV 85 86.4  --   PLT 131* 133*  --     Basic Metabolic Panel: Recent Labs  Lab 10/13/20 1517 10/15/20 2041 10/16/20 0313 10/16/20 0456 10/17/20 0446  NA 141 140 136 138 142  K 2.1* 2.7* 2.3* 2.4* 2.8*  CL 79* 81*  --  84* 91*  CO2 45* 45*  --  40* 40*  GLUCOSE 112* 144*  --  116* 112*  BUN 41* 38*  --  35* 29*  CREATININE 2.03* 1.91*  --  1.76* 1.58*  CALCIUM 9.0 9.0  --  8.3* 8.4*  MG 2.2  --   --  2.3 2.4    GFR: Estimated Creatinine Clearance: 50.6 mL/min (A) (by C-G formula based on SCr of 1.58 mg/dL (H)).  Liver Function Tests: Recent Labs  Lab 10/15/20 2041 10/16/20 0456 10/17/20 0446  AST 195* 156*  157* 136*  ALT 433* 338*  337* 335*  ALKPHOS 367* 306*  313* 304*  BILITOT 1.7* 1.7*  1.7* 1.4*  PROT 6.0* 5.3*  5.4* 5.1*  ALBUMIN 3.6 3.1*  3.2* 2.9*   No results for input(s): LIPASE, AMYLASE in the last 168 hours. Recent Labs  Lab 10/16/20 0325  AMMONIA 44*    Coagulation Profile: Recent Labs  Lab 10/15/20 2041  INR 1.0    Cardiac Enzymes: Recent Labs  Lab 10/16/20 0456  CKTOTAL 57    BNP (last 3 results) No results for input(s): PROBNP in the last 8760 hours.  Lipid Profile: No results for input(s): CHOL, HDL, LDLCALC, TRIG, CHOLHDL, LDLDIRECT in the last 72 hours.  Thyroid Function Tests: Recent Labs    10/17/20 0446  TSH 1.678    Anemia Panel: No results for input(s): VITAMINB12, FOLATE, FERRITIN, TIBC, IRON, RETICCTPCT in the  last 72 hours.  Urine analysis:    Component Value Date/Time   COLORURINE AMBER (A) 10/16/2020 0300   APPEARANCEUR CLEAR 10/16/2020 0300   LABSPEC 1.023 10/16/2020 0300   PHURINE 6.0 10/16/2020 0300   GLUCOSEU NEGATIVE 10/16/2020 0300   HGBUR NEGATIVE 10/16/2020 0300   BILIRUBINUR NEGATIVE 10/16/2020 0300   KETONESUR NEGATIVE 10/16/2020 0300   PROTEINUR 100 (A) 10/16/2020 0300   NITRITE NEGATIVE 10/16/2020 0300   LEUKOCYTESUR NEGATIVE 10/16/2020 0300    Sepsis Labs: Lactic Acid, Venous No results found for: LATICACIDVEN  MICROBIOLOGY: Recent Results (from the past 240 hour(s))  Respiratory Panel by RT PCR (Flu A&B, Covid) - Nasopharyngeal Swab     Status: None   Collection Time: 10/16/20 12:14 AM   Specimen: Nasopharyngeal Swab  Result Value Ref Range Status   SARS Coronavirus 2 by RT PCR NEGATIVE NEGATIVE Final    Comment: (NOTE) SARS-CoV-2 target nucleic acids are NOT DETECTED.  The SARS-CoV-2 RNA is generally detectable in upper respiratoy specimens during the acute phase of infection. The lowest concentration of SARS-CoV-2 viral copies this assay can detect is 131 copies/mL. A negative result does not preclude SARS-Cov-2 infection and should not be used as the sole basis for treatment or other patient management decisions. A negative result may occur with  improper specimen collection/handling, submission of specimen other than nasopharyngeal swab, presence of viral mutation(s) within the areas targeted by this assay, and inadequate number of viral copies (<131 copies/mL). A negative result must be combined with clinical observations, patient history, and epidemiological information. The expected result is Negative.  Fact Sheet for Patients:  PinkCheek.be  Fact Sheet for Healthcare Providers:  GravelBags.it  This test is no t yet approved or cleared by the [place] FDA and  has been authorized for  detection and/or diagnosis of SARS-CoV-2 by FDA under an Emergency Use Authorization (EUA). This EUA will remain  in effect (meaning this test can be used) for the duration of the COVID-19 declaration under Section 564(b)(1) of the Act, 21 U.S.C. section 360bbb-3(b)(1), unless the authorization is terminated or revoked sooner.     Influenza A by PCR NEGATIVE NEGATIVE Final   Influenza B by PCR NEGATIVE NEGATIVE Final    Comment: (NOTE) The Xpert Xpress SARS-CoV-2/FLU/RSV assay is intended as an aid in  the diagnosis of influenza from Nasopharyngeal swab specimens and  should not be used as a sole basis for treatment. Nasal washings and  aspirates are unacceptable for Xpert Xpress SARS-CoV-2/FLU/RSV  testing.  Fact Sheet for Patients: PinkCheek.be  Fact Sheet for Healthcare Providers: GravelBags.it  This test is not yet approved or cleared by the [place] FDA and  has been authorized for detection and/or diagnosis of SARS-CoV-2 by  FDA under an Emergency Use Authorization (EUA). This EUA will remain  in effect (meaning this test can be used) for the duration of the  Covid-19 declaration under Section 564(b)(1) of the Act, 21  U.S.C. section 360bbb-3(b)(1), unless the authorization is  terminated or revoked. Performed  at [facility], [place] [address], [place], [place] [address]   Culture, Urine     Status: None (Preliminary result)   Collection Time: 10/16/20  3:00 AM   Specimen: Urine, Random  Result Value Ref Range Status   Specimen Description URINE, RANDOM  Final   Special Requests NONE  Final   Culture   Final    CULTURE REINCUBATED FOR BETTER GROWTH Performed at [facility], [place] [address], [place], [place] [address]    Report Status PENDING  Incomplete    RADIOLOGY STUDIES/RESULTS: DG Chest 2 View  Result Date: 10/15/2020 CLINICAL DATA:  Shortness of breath EXAM: CHEST - 2 VIEW COMPARISON:   09/29/2020 FINDINGS: Right hilar fullness, similar to prior study. Cannot exclude adenopathy. Heart is normal size. Bilateral lower lobe airspace opacities are similar to prior study. No effusions. No acute bony abnormality. IMPRESSION: Bilateral lower lobe airspace opacities could reflect atelectasis or infiltrates, stable. Stable right hilar fullness. Cannot exclude adenopathy. This could be further evaluated with chest CT with IV contrast if felt clinically indicated. Electronically Signed   By: Rolm Baptise M.D.   On: 10/15/2020 21:00   CT CHEST WO CONTRAST  Result Date: 10/16/2020 CLINICAL DATA:  Respiratory failure. Abnormal x-ray with interstitial infiltrates. Recent CHF. EXAM: CT CHEST WITHOUT CONTRAST TECHNIQUE: Multidetector CT imaging of the chest was performed following the standard protocol without IV contrast. COMPARISON:  Chest x-ray dated 10/15/2020. Abdomen ultrasound from same day. FINDINGS: Cardiovascular: Aortic atherosclerosis. Coronary artery calcifications. No pericardial effusion. No thoracic aortic aneurysm. Mediastinum/Nodes: Bulky lymphadenopathy within the mediastinum, including 2.4 cm short axis lymphadenopathy in the RIGHT lower paratracheal space and 2.2 cm short axis lymphadenopathy within the subcarinal space. Additional bulky lymphadenopathy within the RIGHT axilla, measuring 2.3 cm short axis dimension. Moderately prominent lymphadenopathy within the LEFT axilla, including a 1.3 cm short axis lymph node. Esophagus is unremarkable.  Trachea is unremarkable. Lungs/Pleura: RIGHT perihilar mass measures 7.6 x 4.9 cm (series 3, image 81). This is causing mass effect on the proximal bronchi to the RIGHT upper lobe, RIGHT middle lobe and RIGHT lower lobe, but these bronchi remain patent. Separate pulmonary nodule within the RIGHT lower lobe, posterior aspect, measures 1.6 cm (image 83). Additional smaller pulmonary nodule within the RIGHT middle lobe measures 6 mm (image 88). Patchy  consolidations within the RIGHT lower lung, most likely atelectasis. Upper Abdomen: Diffusely heterogeneous appearance of the liver, highly suspicious for metastatic disease given the earlier ultrasound appearance. Probable underlying cirrhosis. Musculoskeletal: No acute appearing osseous abnormality. No convincing evidence of osseous metastasis. Subtle lucency within the LEFT anterior-lateral fifth and sixth ribs is suspicious for early osseous metastasis. IMPRESSION: 1. RIGHT perihilar mass measures 7.6 x 4.9 cm, consistent with primary bronchogenic carcinoma. This is causing mass effect on the proximal bronchi to the RIGHT upper lobe, RIGHT middle lobe and RIGHT lower lobe, but these bronchi remain patent. 2. Bulky lymphadenopathy within the mediastinum, RIGHT axilla and LEFT axilla, consistent with metastatic disease. 3. Separate pulmonary nodules within the RIGHT lower lobe and RIGHT middle lobe, largest measuring 1.6 cm, compatible with metastatic disease. 4. Diffusely heterogeneous appearance of the liver, highly suspicious for diffuse liver metastases given the corresponding appearance of the liver on earlier ultrasound. 5. Subtle lucencies within the LEFT anterior-lateral fifth and sixth ribs, suspicious for early osseous metastasis. 6. Patchy consolidations within the RIGHT lower lung, most likely atelectasis. Pneumonia is considered less likely. 7. Coronary artery calcifications. Aortic Atherosclerosis (ICD10-I70.0). Electronically Signed  By: Franki Cabot M.D.   On: 10/16/2020 04:51   US Abdomen Limited RUQ (LIVER/GB)  Result Date: 10/16/2020 CLINICAL DATA:  Hepatitis. EXAM: ULTRASOUND ABDOMEN LIMITED RIGHT UPPER QUADRANT COMPARISON:  None. FINDINGS: Gallbladder: No gallstones or wall thickening visualized. No sonographic Murphy sign noted by sonographer. Common bile duct: Diameter: 5 mm Liver: Diffusely heterogeneous parenchyma liver is nearly completely replaced by nodular echogenic areas  of/masses, most confluent/discrete measuring approximately 1 cm greatest dimension. Portal vein is patent on color Doppler imaging with normal direction of blood flow towards the liver. Other: Portal vein measures 1.9 cm diameter. IMPRESSION: 1. Liver parenchyma is nearly completely replaced throughout by nodular echogenic mass-like foci. These are suspicious for metastatic disease, alternatively markedly prominent regenerating nodules. Recommend liver MRI for further characterization. 2. 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 Portal vein is patent with appropriate hepatopetal direction of blood flow. 3. Gallbladder appears normal. Electronically Signed   By: Franki Cabot M.D.   On: 10/16/2020 04:26     LOS: 1 day   Signature  Lala Lund M.D on 10/17/2020 at 10:14 AM   -  To page go to [email or web address]

## 2020-10-18 ENCOUNTER — Inpatient Hospital Stay (HOSPITAL_COMMUNITY): Payer: No Typology Code available for payment source

## 2020-10-18 ENCOUNTER — Telehealth: Payer: Self-pay | Admitting: Internal Medicine

## 2020-10-18 ENCOUNTER — Other Ambulatory Visit: Payer: Self-pay

## 2020-10-18 DIAGNOSIS — B179 Acute viral hepatitis, unspecified: Secondary | ICD-10-CM | POA: Diagnosis not present

## 2020-10-18 LAB — COMPREHENSIVE METABOLIC PANEL
ALT: 288 U/L — ABNORMAL HIGH (ref 0–44)
AST: 89 U/L — ABNORMAL HIGH (ref 15–41)
Albumin: 2.9 g/dL — ABNORMAL LOW (ref 3.5–5.0)
Alkaline Phosphatase: 334 U/L — ABNORMAL HIGH (ref 38–126)
Anion gap: 11 (ref 5–15)
BUN: 31 mg/dL — ABNORMAL HIGH (ref 8–23)
CO2: 36 mmol/L — ABNORMAL HIGH (ref 22–32)
Calcium: 8.5 mg/dL — ABNORMAL LOW (ref 8.9–10.3)
Chloride: 97 mmol/L — ABNORMAL LOW (ref 98–111)
Creatinine, Ser: 1.64 mg/dL — ABNORMAL HIGH (ref 0.61–1.24)
GFR, Estimated: 45 mL/min — ABNORMAL LOW (ref >60)
Glucose, Bld: 118 mg/dL — ABNORMAL HIGH (ref 70–99)
Potassium: 3.9 mmol/L (ref 3.5–5.1)
Sodium: 144 mmol/L (ref 135–145)
Total Bilirubin: 1.5 mg/dL — ABNORMAL HIGH (ref 0.3–1.2)
Total Protein: 5.4 g/dL — ABNORMAL LOW (ref 6.5–8.1)

## 2020-10-18 LAB — PROCALCITONIN: Procalcitonin: 11.53 ng/mL

## 2020-10-18 LAB — CBC WITH DIFFERENTIAL/PLATELET
Abs Immature Granulocytes: 0.12 10*3/uL — ABNORMAL HIGH (ref 0.00–0.07)
Basophils Absolute: 0 10*3/uL (ref 0.0–0.1)
Basophils Relative: 0 %
Eosinophils Absolute: 0 10*3/uL (ref 0.0–0.5)
Eosinophils Relative: 0 %
HCT: 41 % (ref 39.0–52.0)
Hemoglobin: 13.9 g/dL (ref 13.0–17.0)
Immature Granulocytes: 1 %
Lymphocytes Relative: 3 %
Lymphs Abs: 0.4 10*3/uL — ABNORMAL LOW (ref 0.7–4.0)
MCH: 29.6 pg (ref 26.0–34.0)
MCHC: 33.9 g/dL (ref 30.0–36.0)
MCV: 87.2 fL (ref 80.0–100.0)
Monocytes Absolute: 0.4 10*3/uL (ref 0.1–1.0)
Monocytes Relative: 4 %
Neutro Abs: 9.8 10*3/uL — ABNORMAL HIGH (ref 1.7–7.7)
Neutrophils Relative %: 92 %
Platelets: 94 10*3/uL — ABNORMAL LOW (ref 150–400)
RBC: 4.7 MIL/uL (ref 4.22–5.81)
RDW: 13 % (ref 11.5–15.5)
WBC: 10.7 10*3/uL — ABNORMAL HIGH (ref 4.0–10.5)
nRBC: 0 % (ref 0.0–0.2)

## 2020-10-18 LAB — URINE CULTURE: Culture: 70000 — AB

## 2020-10-18 LAB — PROTIME-INR
INR: 1 (ref 0.8–1.2)
Prothrombin Time: 13.2 seconds (ref 11.4–15.2)

## 2020-10-18 LAB — MAGNESIUM: Magnesium: 2.2 mg/dL (ref 1.7–2.4)

## 2020-10-18 LAB — BRAIN NATRIURETIC PEPTIDE: B Natriuretic Peptide: 657.4 pg/mL — ABNORMAL HIGH (ref 0.0–100.0)

## 2020-10-18 MED ORDER — HYDRALAZINE HCL 20 MG/ML IJ SOLN: 10.0000 mg | Freq: Four times a day (QID) | INTRAMUSCULAR | Status: DC | PRN

## 2020-10-18 MED ORDER — LORAZEPAM 1 MG PO TABS
1.0000 mg | ORAL_TABLET | Freq: Four times a day (QID) | ORAL | Status: DC | PRN
  Administered 2020-10-18: 1 mg via ORAL
  Filled 2020-10-18: qty 1

## 2020-10-18 MED ORDER — LIDOCAINE HCL (PF) 1 % IJ SOLN
INTRAMUSCULAR | Status: AC
  Filled 2020-10-18: qty 30

## 2020-10-18 MED ORDER — POTASSIUM CHLORIDE CRYS ER 20 MEQ PO TBCR
40.0000 meq | EXTENDED_RELEASE_TABLET | Freq: Once | ORAL | Status: AC
  Administered 2020-10-18: 40 meq via ORAL
  Filled 2020-10-18: qty 2

## 2020-10-18 NOTE — Progress Notes (Signed)
OT Cancellation Note  Patient Details Name: Chase Barton MRN: [id number] DOB: 05-Jul-1951   Cancelled Treatment:    Reason Eval/Treat Not Completed: (P) Patient at procedure or test/ unavailable (off floor for biopsy. will attempt later time.)  [facility] 10/18/2020, 1:33 PM  Maurie Boettcher, OT/L   Acute OT Clinical Specialist Acute Rehabilitation Services Pager [phone] Office [phone]

## 2020-10-18 NOTE — Telephone Encounter (Signed)
Noted. TY Caitlin.

## 2020-10-18 NOTE — Progress Notes (Signed)
PROGRESS NOTE        PATIENT DETAILS Name: Chase Barton Age: 69 y.o. Sex: male Date of Birth: [birth year] Admit Date: 10/15/2020 Admitting Physician Vernelle Emerald, MD GGY:IRSWN, Clent Ridges, MD  Brief Narrative: Patient is a 69 y.o. male with history of tobacco use (quit earlier this month), CAD-s/p PCI to LAD in 2013-subsequent PCI to RCA/LCx on 09/29/2020-presenting with exertional dyspnea, fatigue, 20-30 pound weight loss-outpatient lab work-up showed AKI, elevated LFTs-found to have acute hypoxic respiratory failure requiring O2 supplementation in the emergency room-subsequent imaging studies done in the emergency room suggestive of metastatic lung cancer-admitted to the hospitalist service for further evaluation and treatment.  Significant events: 10/4-10/8>> hospitalization under cardiology service-decompensated diastolic heart failure-s/p PCI to RCA/circumflex on 10/6 10/22>> admit for acute hypoxic respiratory failure, AKI, elevated LFTs-imaging studies suggestive of metastatic lung cancer  Significant studies: 10/5>> Echo: EF 46-27%, grade 2 diastolic dysfunction 03/50>> RUQ ultrasound: Liver parenchyma completely replaced by nodular echogenic mass-suspicious for metastatic disease, portal vein dilatation suggestive of portal venous hypertension. 10/23>> CT chest: Right perihilar mass, mediastinal adenopathy, right/left axillary adenopathy, left 6/8 rib mets, diffuse liver metastases  Antimicrobial therapy: None  Microbiology data: 10/23>> urine culture: Pending  Procedures : None  Consults: Phone consult-cardiology-Dr. Thompson Grayer on 10/23 IR  DVT Prophylaxis : enoxaparin (LOVENOX) injection 40 mg Start: 10/16/20 1000  Subjective:  Patient in bed, appears comfortable, denies any headache, no fever, no chest pain or pressure, no shortness of breath , no abdominal pain. No focal weakness.   Assessment/Plan:  Acute hypoxic  respiratory failure: Secondary to lung mass-possible postobstructive pneumonia-given significantly elevated procalcitonin-start Unasyn per pharmacy.  Does not appear to be volume overloaded.  Follow.  AKI on CKD stage IIIb: AKI likely hemodynamically mediated-secondary diuretics/poor oral intake in the setting of metastatic lung cancer.  Does have proteinuria-could have membranous nephropathy due to lung cancer.  Renal function improving with supportive care-doubt further work-up will change outcome and management.  Avoid nephrotoxic agents.  Elevated liver enzymes: Multifactorial-secondary to amiodarone use + liver mass.  Stopped amiodarone-continue supportive care-avoid hepatotoxic agents-liver enzymes slowly downtrending.  Hypokalemia: Replaced IV and oral, will monitor.   Newly diagnosed/probable metastatic lung CA: History of significant tobacco use-CT chest with what appears to be lung mass with mets to liver/bone/mediastinum-high suspicion for bronchogenic carcinoma.  Difficult situation-just had PCI to RCA/LCx on 10/6- done by Dr Fletcher Anon - dual antiplatelet therapy cannot be stopped for at least 1 month and preferably 3 months.  IR to try and do axillary node biopsy as the first step on 10/18/2020.  Oncology outpatient follow-up post biopsy, requested oncology to assist with outpatient follow-up, patient wants to be discharged on 10/19/2020 and follow-up with oncology outpatient for further results, daughter Mickel Baas updated on 10/18/2020, agrees with the plan.   Chronic diastolic heart failure, EF 55% on echocardiogram done 09/28/2020: Euvolemic on exam-acknowledges significant improvement in volume status-apparently did have significant ankle edema during his most recent hospitalization.  Currently Lasix as needed.  Continue beta-blocker.  CAD s/p PCI to LAD 2013-and PCI to RCA/LCx 09/29/2020: Continue aspirin/Plavix/beta-blocker/Ranexa-statin on hold due to elevated LFTs.  See above discussion  regarding dual antiplatelet agents.  History of PVCs/bigeminy: Placed on suppressive amiodarone last admission-have discontinued due to elevated LFTs-follow telemetry and optimize beta-blocker regimen accordingly.  HTN: Blood pressure elevated on combination of Coreg  and Norvasc, added hydralazine.  COPD: No flare-continue bronchodilators  BPH: Continue Flomax   Recent Labs  Lab 10/13/20 1517 10/15/20 2041 10/15/20 2338 10/16/20 0014 10/16/20 0313 10/16/20 0456 10/17/20 0446  WBC 14.0* 13.2*  --   --   --   --   --   HGB 15.4 14.8  --   --  12.6*  --   --   HCT 44.1 43.1  --   --  37.0*  --   --   PLT 131* 133*  --   --   --   --   --   CRP  --   --   --   --   --   --  <0.5  BNP  --  282.7*  --   --   --   --   --   DDIMER  --   --  0.81*  --   --   --   --   PROCALCITON  --   --   --   --   --  9.57 6.63  AST  --  195*  --   --   --  156*  157* 136*  ALT  --  433*  --   --   --  338*  337* 335*  ALKPHOS  --  367*  --   --   --  306*  313* 304*  BILITOT  --  1.7*  --   --   --  1.7*  1.7* 1.4*  ALBUMIN  --  3.6  --   --   --  3.1*  3.2* 2.9*  INR  --  1.0  --   --   --   --   --   SARSCOV2NAA  --   --   --  NEGATIVE  --   --   --       Diet: Diet Order            Diet NPO time specified Except for: Sips with Meds  Diet effective ____                  Code Status: Full code   Family Communication:  Spoke with daughter Zacarias Pontes who is a HHRN-tel [phone]) over wife's phone, wife bedside on 10/17/2020.  Spoke with Dr. Zacarias Pontes over the phone on 10/18/2020.  Plan clearly explained    Disposition Plan: Status is: Inpt  The patient will require care spanning > 2 midnights and should be moved to inpatient because: Inpatient level of care appropriate due to severity of illness  Dispo: The patient is from: Home              Anticipated d/c is to: Home              Anticipated d/c date is: 2 days              Patient currently is not medically stable to  d/c.  Barriers to Discharge: Hypokalemia/AKI/hepatitis-requiring IV fluids/IV potassium supplementation-postobstructive pneumonia-starting IV antibiotics, biopsy to rule out malignancy pending.  Antimicrobial agents: Anti-infectives (From admission, onward)   Start     Dose/Rate Route Frequency Ordered Stop   10/16/20 1115  Ampicillin-Sulbactam (UNASYN) 3 g in sodium chloride 0.9 % 100 mL IVPB        3 g 200 mL/hr over 30 Minutes Intravenous Every 6 hours 10/16/20 1101         Time spent: 35 minutes-Greater than  50% of this time was spent in counseling, explanation of diagnosis, planning of further management, and coordination of care.  MEDICATIONS: Scheduled Meds: . amLODipine  10 mg Oral Daily  . ascorbic acid  500 mg Oral Daily  . aspirin EC  81 mg Oral Daily  . carvedilol  12.5 mg Oral BID WC  . clopidogrel  75 mg Oral Q breakfast  . enoxaparin (LOVENOX) injection  40 mg Subcutaneous Daily  . hydrALAZINE  50 mg Oral Q8H  . ipratropium-albuterol  3 mL Nebulization TID  . latanoprost  1 drop Both Eyes Daily  . lidocaine (PF)      . potassium chloride  40 mEq Oral Once  . ranolazine  1,000 mg Oral BID  . sertraline  50 mg Oral Daily  . tamsulosin  0.4 mg Oral Daily   Continuous Infusions: . ampicillin-sulbactam (UNASYN) IV Stopped (10/18/20 0643)   PRN Meds:.hydrALAZINE, ipratropium-albuterol, LORazepam, nitroGLYCERIN, [DISCONTINUED] ondansetron **OR** ondansetron (ZOFRAN) IV, polyethylene glycol, traMADol   PHYSICAL EXAM: Vital signs: Vitals:   10/18/20 0319 10/18/20 0617 10/18/20 0618 10/18/20 1005  BP: (!) 152/81 (!) 170/87  (!) 181/75  Pulse: 71   71  Resp: 19   20  Temp: 97.6 F (36.4 C)     TempSrc: Oral     SpO2: 92%   95%  Weight:   99.8 kg   Height:       Filed Weights   10/16/20 1357 10/17/20 0522 10/18/20 0618  Weight: 91.4 kg 98.1 kg 99.8 kg   Body mass index is 28.25 kg/m.   Gen Exam:  Awake Alert, No new F.N deficits, Normal  affect [place].AT,PERRAL Supple Neck,No JVD, No cervical lymphadenopathy appriciated.  Symmetrical Chest wall movement, Good air movement bilaterally, CTAB RRR,No Gallops, Rubs or new Murmurs, No Parasternal Heave +ve B.Sounds, Abd Soft, No tenderness, No organomegaly appriciated, No rebound - guarding or rigidity. No Cyanosis, Clubbing or edema, No new Rash or bruise    I have personally reviewed following labs and imaging studies  LABORATORY DATA: CBC: Recent Labs  Lab 10/13/20 1517 10/15/20 2041 10/16/20 0313  WBC 14.0* 13.2*  --   NEUTROABS  --  11.8*  --   HGB 15.4 14.8 12.6*  HCT 44.1 43.1 37.0*  MCV 85 86.4  --   PLT 131* 133*  --     Basic Metabolic Panel: Recent Labs  Lab 10/13/20 1517 10/13/20 1517 10/15/20 2041 10/16/20 0313 10/16/20 0456 10/17/20 0446 10/17/20 1937  NA 141  --  140 136 138 142  --   K 2.1*   < > 2.7* 2.3* 2.4* 2.8* 3.4*  CL 79*  --  81*  --  84* 91*  --   CO2 45*  --  45*  --  40* 40*  --   GLUCOSE 112*  --  144*  --  116* 112*  --   BUN 41*  --  38*  --  35* 29*  --   CREATININE 2.03*  --  1.91*  --  1.76* 1.58*  --   CALCIUM 9.0  --  9.0  --  8.3* 8.4*  --   MG 2.2  --   --   --  2.3 2.4  --    < > = values in this interval not displayed.    GFR: Estimated Creatinine Clearance: 54.9 mL/min (A) (by C-G formula based on SCr of 1.58 mg/dL (H)).  Liver Function Tests: Recent Labs  Lab 10/15/20 2041 10/16/20 0456  10/17/20 0446  AST 195* 156*  157* 136*  ALT 433* 338*  337* 335*  ALKPHOS 367* 306*  313* 304*  BILITOT 1.7* 1.7*  1.7* 1.4*  PROT 6.0* 5.3*  5.4* 5.1*  ALBUMIN 3.6 3.1*  3.2* 2.9*   No results for input(s): LIPASE, AMYLASE in the last 168 hours. Recent Labs  Lab 10/16/20 0325  AMMONIA 44*    Coagulation Profile: Recent Labs  Lab 10/15/20 2041  INR 1.0    Cardiac Enzymes: Recent Labs  Lab 10/16/20 0456  CKTOTAL 57    BNP (last 3 results) No results for input(s): PROBNP in the last 8760  hours.  Lipid Profile: No results for input(s): CHOL, HDL, LDLCALC, TRIG, CHOLHDL, LDLDIRECT in the last 72 hours.  Thyroid Function Tests: Recent Labs    10/17/20 0446  TSH 1.678    Anemia Panel: No results for input(s): VITAMINB12, FOLATE, FERRITIN, TIBC, IRON, RETICCTPCT in the last 72 hours.  Urine analysis:    Component Value Date/Time   COLORURINE AMBER (A) 10/16/2020 0300   APPEARANCEUR CLEAR 10/16/2020 0300   LABSPEC 1.023 10/16/2020 0300   PHURINE 6.0 10/16/2020 0300   GLUCOSEU NEGATIVE 10/16/2020 0300   HGBUR NEGATIVE 10/16/2020 0300   BILIRUBINUR NEGATIVE 10/16/2020 0300   KETONESUR NEGATIVE 10/16/2020 0300   PROTEINUR 100 (A) 10/16/2020 0300   NITRITE NEGATIVE 10/16/2020 0300   LEUKOCYTESUR NEGATIVE 10/16/2020 0300    Sepsis Labs: Lactic Acid, Venous No results found for: LATICACIDVEN  MICROBIOLOGY: Recent Results (from the past 240 hour(s))  Respiratory Panel by RT PCR (Flu A&B, Covid) - Nasopharyngeal Swab     Status: None   Collection Time: 10/16/20 12:14 AM   Specimen: Nasopharyngeal Swab  Result Value Ref Range Status   SARS Coronavirus 2 by RT PCR NEGATIVE NEGATIVE Final    Comment: (NOTE) SARS-CoV-2 target nucleic acids are NOT DETECTED.  The SARS-CoV-2 RNA is generally detectable in upper respiratoy specimens during the acute phase of infection. The lowest concentration of SARS-CoV-2 viral copies this assay can detect is 131 copies/mL. A negative result does not preclude SARS-Cov-2 infection and should not be used as the sole basis for treatment or other patient management decisions. A negative result may occur with  improper specimen collection/handling, submission of specimen other than nasopharyngeal swab, presence of viral mutation(s) within the areas targeted by this assay, and inadequate number of viral copies (<131 copies/mL). A negative result must be combined with clinical observations, patient history, and epidemiological  information. The expected result is Negative.  Fact Sheet for Patients:  PinkCheek.be  Fact Sheet for Healthcare Providers:  GravelBags.it  This test is no t yet approved or cleared by the [place] FDA and  has been authorized for detection and/or diagnosis of SARS-CoV-2 by FDA under an Emergency Use Authorization (EUA). This EUA will remain  in effect (meaning this test can be used) for the duration of the COVID-19 declaration under Section 564(b)(1) of the Act, 21 U.S.C. section 360bbb-3(b)(1), unless the authorization is terminated or revoked sooner.     Influenza A by PCR NEGATIVE NEGATIVE Final   Influenza B by PCR NEGATIVE NEGATIVE Final    Comment: (NOTE) The Xpert Xpress SARS-CoV-2/FLU/RSV assay is intended as an aid in  the diagnosis of influenza from Nasopharyngeal swab specimens and  should not be used as a sole basis for treatment. Nasal washings and  aspirates are unacceptable for Xpert Xpress SARS-CoV-2/FLU/RSV  testing.  Fact Sheet for Patients: PinkCheek.be  Fact Sheet for  Healthcare Providers: GravelBags.it  This test is not yet approved or cleared by the [place] and  has been authorized for detection and/or diagnosis of SARS-CoV-2 by  FDA under an Emergency Use Authorization (EUA). This EUA will remain  in effect (meaning this test can be used) for the duration of the  Covid-19 declaration under Section 564(b)(1) of the Act, 21  U.S.C. section 360bbb-3(b)(1), unless the authorization is  terminated or revoked. Performed at [facility], [place] [address] [place], [place] [address]   Culture, Urine     Status: Abnormal   Collection Time: 10/16/20  3:00 AM   Specimen: Urine, Random  Result Value Ref Range Status   Specimen Description URINE, RANDOM  Final   Special Requests   Final    NONE Performed at [facility], [place] [address] [place], [place] [address]    Culture 70,000 COLONIES/mL STAPHYLOCOCCUS HAEMOLYTICUS (A)  Final   Report Status 10/18/2020 FINAL  Final   Organism ID, Bacteria STAPHYLOCOCCUS HAEMOLYTICUS (A)  Final      Susceptibility   Staphylococcus haemolyticus - MIC*    CIPROFLOXACIN >=8 RESISTANT Resistant     GENTAMICIN <=0.5 SENSITIVE Sensitive     NITROFURANTOIN 32 SENSITIVE Sensitive     OXACILLIN >=4 RESISTANT Resistant     TETRACYCLINE >=16 RESISTANT Resistant     VANCOMYCIN 1 SENSITIVE Sensitive     TRIMETH/SULFA <=10 SENSITIVE Sensitive     CLINDAMYCIN <=0.25 SENSITIVE Sensitive     RIFAMPIN <=0.5 SENSITIVE Sensitive     Inducible Clindamycin NEGATIVE Sensitive     * 70,000 COLONIES/mL STAPHYLOCOCCUS HAEMOLYTICUS    RADIOLOGY STUDIES/RESULTS: No results found.   LOS: 2 days   Signature  Lala Lund M.D on 10/18/2020 at 10:22 AM   -  To page go to [email or web address]

## 2020-10-18 NOTE — Progress Notes (Addendum)
   10/18/20 0324  Provider Notification  Provider Name/Title Dr. Myna Hidalgo  Date Provider Notified 10/18/20  Time Provider Notified [phone]  Notification Type Page  Notification Reason Requested by patient/family (pt. c/o chest pain,voiced nervous/anxious on upcoming test)  Response Other (Comment) (waiting)    Patient reported a 5/10 pain in the middle of his chest, not radiating to his arms, no numbness feeling. Pt. Voiced about being so nervous on upcoming biopsy. He asked this RN if he can have a medication to help calm his nerves.   VS checked BP_152/81, HR-70, RR-18,

## 2020-10-18 NOTE — Sedation Documentation (Signed)
US Biopsy was no sedation.  Patient tolerated well the procedure with lidocaine.

## 2020-10-18 NOTE — Progress Notes (Signed)
Physical Therapy Treatment Patient Details Name: Chase Barton MRN: [id number] DOB: October 17, 1951 Today's Date: 10/18/2020    History of Present Illness 69 y.o. male with history of tobacco use (quit earlier this month), CAD-s/p PCI to LAD in 2013-subsequent PCI to RCA/LCx on 09/29/2020-presenting with exertional dyspnea, fatigue, 20-30 pound weight loss-outpatient lab work-up showed AKI, elevated LFTs-found to have acute hypoxic respiratory failure requiring O2 supplementation in the emergency room. Further imaging suggestive of metastatic lung cancer resulting in admission. Lung mass along with mets to liver, bone, mediastinum.    PT Comments    Pt in bed trying to rest after procedure. Pt reluctantly agreeable to work with therapy to trial cane use. Pt utilized cane to ambulate to bathroom with supervision, however with ambulation in hall way pt requires increased UE support on hand rail and furniture. Pt switched back to use of RW and able to progress ambulation to 200 feet. PT recommends RW for home use. D/c plans remain appropriate. PT will continue to follow acutely.    Follow Up Recommendations  Home health PT;Supervision - Intermittent     Equipment Recommendations  Rolling walker with 5" wheels       Precautions / Restrictions Precautions Precautions: Fall Restrictions Weight Bearing Restrictions: No    Mobility  Bed Mobility Overal bed mobility: Needs Assistance Bed Mobility: Sit to Supine       Sit to supine: Supervision      Transfers Overall transfer level: Needs assistance Equipment used: Rolling walker (2 wheeled) Transfers: Sit to/from [facility] Sit to Stand: Supervision Stand pivot transfers: Supervision       General transfer comment: cues for use of rails with transfer from commode  Ambulation/Gait Ambulation/Gait assistance: Supervision Gait Distance (Feet): 200 Feet Assistive device: Rolling walker (2 wheeled);Straight  cane Gait Pattern/deviations: Step-through pattern Gait velocity: reduced   General Gait Details: trialed cane however pt with increased reaching out for furniture for steadying, ambulation with RW much steadier and able to progress to 200 feet ambulation        Balance Overall balance assessment: Needs assistance Sitting-balance support: No upper extremity supported;Feet supported Sitting balance-Leahy Scale: Good     Standing balance support: No upper extremity supported;During functional activity Standing balance-Leahy Scale: Fair Standing balance comment: supervision for static standing                            Cognition Arousal/Alertness: Awake/alert Behavior During Therapy: WFL for tasks assessed/performed Overall Cognitive Status: Within Functional Limits for tasks assessed                                           General Comments General comments (skin integrity, edema, etc.): pt on 1L O2 via [place] on entry, able to ambulate on RA and keep SaO2 >91%O2      Pertinent Vitals/Pain Pain Assessment: No/denies pain           PT Goals (current goals can now be found in the care plan section) Acute Rehab PT Goals Patient Stated Goal: To return home PT Goal Formulation: With patient/family Time For Goal Achievement: 10/31/20 Potential to Achieve Goals: Good Progress towards PT goals: Progressing toward goals    Frequency    Min 3X/week      PT Plan Current plan remains appropriate  AM-PAC PT "6 Clicks" Mobility   Outcome Measure  Help needed turning from your back to your side while in a flat bed without using bedrails?: None Help needed moving from lying on your back to sitting on the side of a flat bed without using bedrails?: None Help needed moving to and from a bed to a chair (including a wheelchair)?: None Help needed standing up from a chair using your arms (e.g., wheelchair or bedside chair)?: None Help needed to  walk in hospital room?: None Help needed climbing 3-5 steps with a railing? : A Little 6 Click Score: 23    End of Session Equipment Utilized During Treatment: Oxygen Activity Tolerance: Patient tolerated treatment well Patient left: in bed;with call bell/phone within reach;with family/visitor present Nurse Communication: Mobility status PT Visit Diagnosis: Other abnormalities of gait and mobility (R26.89);Other symptoms and signs involving the nervous system (D56.861)     Time: 6837-2902 PT Time Calculation (min) (ACUTE ONLY): 22 min  Charges:  $Gait Training: 23-37 mins                     Sheena Simonis B. Migdalia Dk PT, DPT Acute Rehabilitation Services Pager [phone] Office [phone]    [place] 10/18/2020, 3:53 PM

## 2020-10-18 NOTE — Procedures (Signed)
Interventional Radiology Procedure:   Indications: Right lung mass with axillary lymphadenopathy  Procedure: US guided core biopsy of right axillary node  Findings: 6 cores from right axillary node   Complications: None     EBL: less than 10 ml  Plan: Return to inpatient floor   Norman Bier R. Anselm Pancoast, MD  Pager: [phone]

## 2020-10-18 NOTE — Progress Notes (Signed)
Nutrition Brief Note  Patient identified on the Malnutrition Screening Tool (MST) Report  Wt Readings from Last 15 Encounters:  10/18/20 99.8 kg  10/13/20 96.7 kg  10/01/20 102.3 kg  10/02/19 102.5 kg  02/20/19 105.7 kg  07/16/18 104.6 kg  05/18/16 104.6 kg  01/05/16 102.5 kg  12/06/15 105.3 kg  01/28/15 102.6 kg  11/11/12 105.5 kg  10/14/12 105.9 kg  08/06/12 104.7 kg  07/09/12 104.6 kg  06/20/12 84.91 kg   69 year-old male with past medical history of hypertension, PSVT S/P ablation, diastolic congestive heart failure (Echo 09/28/2020 EF 55-60%), chronic kidney disease stage IIIb, hyperlipidemia, coronary artery disease (LAD stent 2013 with 2 more stents placed 09/2020) who [facility] emergency department complaints of shortness of breath.  Pt admitted with acute hypoxic respiratory failure.  Reviewed I/O's: +40 ml x 24 hours and +767 ml since admission  UOP: 650 ml x 24 hours  Per MD notes,plan for lymph node biopsy today; suspect diagnosis of metastatic lung cancer.   Spoke with pt and daughter at bedside. Pt reports great appetite and is eager to eat after procedure. Per daughter, pt with great appetite, consuming 3 meals per day. Pt is careful about his sodium intake and favorite foods consume fruits and yogurt.   Pt denies any weight loss. He reports his dry wt is around 215#. Daughter reports there have been some discrepancies with weight readings since being admitted. Reviewed wt hx; pt has experienced a 2.6% wt loss over the past year, which is not significant for time frame.   Nutrition-Focused physical exam completed. Findings are no fat depletion, no muscle depletion, and no edema.    Pt denies any further nutritional needs or questions.   Medications reviewed and include vitamin C and lovenox.   Labs reviewed: K: 3.4.   Current diet order is Heart Healthy, patient is consuming approximately 100% of meals at this time. Labs and medications  reviewed.   No nutrition interventions warranted at this time. If nutrition issues arise, please consult RD.   Loistine Chance, RD, LDN, [place] Registered Dietitian II Certified Diabetes Care and Education Specialist Please refer to [facility] for RD and/or RD on-call/weekend/after hours pager

## 2020-10-18 NOTE — Telephone Encounter (Signed)
He is presently admitted - he had a CT scan suggestive of metastatic lung cancer. Appears he is scheduled for lymph node biopsy today. Hospitalist following - they are holding Amiodarone.   Loel Dubonnet, NP

## 2020-10-18 NOTE — Telephone Encounter (Signed)
[name] has been scheduled to see Dr. Julien Nordmann on 11/2 at 4pm w/labs at 330pm. Pt is currently in the hospital. A msg has been sent to Dr. Candiss Norse with the appt date and time so that the pt will receive it upon discharge.

## 2020-10-19 DIAGNOSIS — N179 Acute kidney failure, unspecified: Secondary | ICD-10-CM | POA: Diagnosis not present

## 2020-10-19 DIAGNOSIS — R918 Other nonspecific abnormal finding of lung field: Secondary | ICD-10-CM

## 2020-10-19 DIAGNOSIS — I10 Essential (primary) hypertension: Secondary | ICD-10-CM

## 2020-10-19 DIAGNOSIS — B179 Acute viral hepatitis, unspecified: Secondary | ICD-10-CM | POA: Diagnosis not present

## 2020-10-19 DIAGNOSIS — E876 Hypokalemia: Secondary | ICD-10-CM | POA: Diagnosis not present

## 2020-10-19 DIAGNOSIS — N1832 Chronic kidney disease, stage 3b: Secondary | ICD-10-CM | POA: Diagnosis not present

## 2020-10-19 LAB — BRAIN NATRIURETIC PEPTIDE: B Natriuretic Peptide: 679.4 pg/mL — ABNORMAL HIGH (ref 0.0–100.0)

## 2020-10-19 LAB — CBC WITH DIFFERENTIAL/PLATELET
Abs Immature Granulocytes: 0.11 10*3/uL — ABNORMAL HIGH (ref 0.00–0.07)
Basophils Absolute: 0 10*3/uL (ref 0.0–0.1)
Basophils Relative: 0 %
Eosinophils Absolute: 0 10*3/uL (ref 0.0–0.5)
Eosinophils Relative: 0 %
HCT: 40.3 % (ref 39.0–52.0)
Hemoglobin: 13.6 g/dL (ref 13.0–17.0)
Immature Granulocytes: 1 %
Lymphocytes Relative: 3 %
Lymphs Abs: 0.3 10*3/uL — ABNORMAL LOW (ref 0.7–4.0)
MCH: 29.9 pg (ref 26.0–34.0)
MCHC: 33.7 g/dL (ref 30.0–36.0)
MCV: 88.6 fL (ref 80.0–100.0)
Monocytes Absolute: 0.4 10*3/uL (ref 0.1–1.0)
Monocytes Relative: 4 %
Neutro Abs: 10.2 10*3/uL — ABNORMAL HIGH (ref 1.7–7.7)
Neutrophils Relative %: 92 %
Platelets: 99 10*3/uL — ABNORMAL LOW (ref 150–400)
RBC: 4.55 MIL/uL (ref 4.22–5.81)
RDW: 13.2 % (ref 11.5–15.5)
WBC: 11.1 10*3/uL — ABNORMAL HIGH (ref 4.0–10.5)
nRBC: 0 % (ref 0.0–0.2)

## 2020-10-19 LAB — COMPREHENSIVE METABOLIC PANEL
ALT: 264 U/L — ABNORMAL HIGH (ref 0–44)
AST: 83 U/L — ABNORMAL HIGH (ref 15–41)
Albumin: 2.8 g/dL — ABNORMAL LOW (ref 3.5–5.0)
Alkaline Phosphatase: 319 U/L — ABNORMAL HIGH (ref 38–126)
Anion gap: 10 (ref 5–15)
BUN: 39 mg/dL — ABNORMAL HIGH (ref 8–23)
CO2: 35 mmol/L — ABNORMAL HIGH (ref 22–32)
Calcium: 8.6 mg/dL — ABNORMAL LOW (ref 8.9–10.3)
Chloride: 98 mmol/L (ref 98–111)
Creatinine, Ser: 1.76 mg/dL — ABNORMAL HIGH (ref 0.61–1.24)
GFR, Estimated: 41 mL/min — ABNORMAL LOW (ref >60)
Glucose, Bld: 147 mg/dL — ABNORMAL HIGH (ref 70–99)
Potassium: 3.8 mmol/L (ref 3.5–5.1)
Sodium: 143 mmol/L (ref 135–145)
Total Bilirubin: 1.6 mg/dL — ABNORMAL HIGH (ref 0.3–1.2)
Total Protein: 5 g/dL — ABNORMAL LOW (ref 6.5–8.1)

## 2020-10-19 LAB — PROCALCITONIN: Procalcitonin: 12.68 ng/mL

## 2020-10-19 LAB — PROTIME-INR
INR: 1.1 (ref 0.8–1.2)
Prothrombin Time: 13.5 seconds (ref 11.4–15.2)

## 2020-10-19 LAB — MAGNESIUM: Magnesium: 2.4 mg/dL (ref 1.7–2.4)

## 2020-10-19 MED ORDER — ISOSORB DINITRATE-HYDRALAZINE 20-37.5 MG PO TABS
1.0000 | ORAL_TABLET | Freq: Three times a day (TID) | ORAL | 0 refills | Status: DC
Start: 2020-10-19 — End: 2020-10-20

## 2020-10-19 MED ORDER — SODIUM CHLORIDE 0.9 % IV SOLN: INTRAVENOUS | Status: DC

## 2020-10-19 MED ORDER — ISOSORB DINITRATE-HYDRALAZINE 20-37.5 MG PO TABS
1.0000 | ORAL_TABLET | Freq: Three times a day (TID) | ORAL | Status: DC
  Administered 2020-10-19: 1 via ORAL
  Filled 2020-10-19: qty 1

## 2020-10-19 MED ORDER — AMOXICILLIN-POT CLAVULANATE 875-125 MG PO TABS: 1.0000 | ORAL_TABLET | Freq: Two times a day (BID) | ORAL | 0 refills | Status: AC

## 2020-10-19 NOTE — Plan of Care (Signed)

## 2020-10-19 NOTE — TOC Transition Note (Addendum)
Transition of Care [facility]) - CM/SW Discharge Note   Patient Details  Name: Chase Barton MRN: [id number] Date of Birth: [birth year]  Transition of Care [facility]) CM/SW Contact:  Zenon Mayo, RN Phone Number: 10/19/2020, 9:55 AM   Clinical Narrative:    Patient is for dc today, NCM offered choice, daughter chose [facility] for patient for HHRN,HHPT, HHAIDE.  Also ok with [employer] supplying the oxygen.  NCM made referral to [facility] with [facility] and made referral to [facility] with [employer] for home oxygen and rollator.  The DME will be brought to patient 's room prior to dc. Daugter phone is [phone] - Emily   Final next level of care: Home w Home Health Services Barriers to Discharge: No Barriers Identified   Patient Goals and CMS Choice   CMS Medicare.gov Compare Post Acute Care list provided to:: Patient Represenative (must comment) Choice offered to / list presented to : Adult Children  Discharge Placement                       Discharge Plan and Services                DME Arranged: Walker rolling with seat, Oxygen DME Agency: [employer] Date DME Agency Contacted: 10/19/20 Time DME Agency Contacted: [phone] Representative spoke with at DME Agency: Thedore Mins HH Arranged: RN, PT, Nurse's Aide [place] Agency: [place] Date [place]: 10/18/20 Time [place]: 1000 Representative spoke with at [place]: [place] Determinants of Health ([place]) Interventions     Readmission Risk Interventions No flowsheet data found.

## 2020-10-19 NOTE — Discharge Summary (Addendum)
Physician Discharge Summary  Chase Barton OFB:[id number] DOB: 02-20-1951 DOA: 10/15/2020  PCP: Loma Sender, MD  Admit date: 10/15/2020 Discharge date: 10/19/2020  Admitted From: Home Disposition:  Home  Recommendations for Outpatient Follow-up:  1. Follow up with Oncologist in 1-2 weeks for biopsy results of right axillary lymph node 2. Please obtain hepatic function panel/BMP/CBC in one week   Home Health:Yes Equipment/Devices:Oxygen Discharge Condition:Stable CODE STATUS:Full Diet recommendation: Heart Healthy  Brief/Interim Summary: Chase Barton is an 69 y.o. male past medical history significant for tobacco abuse CAD status post PCI to the LAD 2013 and subsequently to the RCA and circumflex in 20 presents with exertional shortness of breath and a 20 pound weight loss, as an outpatient lab work showed acute kidney injury LFTs were found to be elevated and in acute hypoxic respiratory failure requiring oxygen supplementation.  Significant events: 10/4-10/8>> hospitalization under cardiology service-decompensated diastolic heart failure-s/p PCI to RCA/circumflex on 10/6 10/22>> admit for acute hypoxic respiratory failure, AKI, elevated LFTs-imaging studies suggestive of metastatic lung cancer 10/16/2020 CT-guided right axillary lymph node biopsy which results are pending  Significant studies: 10/5>> Echo: EF 78-24%, grade 2 diastolic dysfunction 23/53>> RUQ ultrasound: Liver parenchyma completely replaced by nodular echogenic mass-suspicious for metastatic disease, portal vein dilatation suggestive of portal venous hypertension. 10/23>> CT chest: Right perihilar mass, mediastinal adenopathy, right/left axillary adenopathy, left 6/8 rib mets, diffuse liver metastases.  Discharge Diagnoses:  Principal Problem:   Acute hepatitis Active Problems:   Coronary artery disease involving native coronary artery of native heart with unstable angina pectoris (HCC)    Mixed hyperlipidemia   Essential hypertension   COPD (chronic obstructive pulmonary disease) (HCC)   Chronic diastolic CHF (congestive heart failure) (HCC)   CKD stage G3b/A2, GFR 30-44 and albumin creatinine ratio 30-299 mg/g (HCC)   Acute respiratory failure with hypoxia (HCC)   Hypokalemia   Leukocytosis   Acute renal failure superimposed on stage 3b chronic kidney disease (HCC)   Lung mass  Acute respiratory failure with hypoxia secondary to newly diagnosed metastatic lung disease postobstructive pneumonia: Given the significantly elevated procalcitonin and leukocytosis. He was started on the IV Unasyn and his infection seems to be improving. He was demanding to be discharged home he was transitioned to oral Augmentin which should continue as an outpatient.  Acute kidney injury on chronic kidney disease stage IIIb: Likely hemodynamically mediated secondary to diuretics and decreased oral intake in the setting of metastatic lung disease. He was started on IV fluid hydration and his creatinine returned to baseline which is 1.5-1.7.  Elevated LFTs: Multifactorial in the setting of amiodarone and liver mass continue supportive care.  Newly diagnosed possible metastatic lung cancer: With a past medical history of tobacco abuse CT of the chest show what appears to be liver bone mediastinal and lung mass suspicious for bronchogenic carcinoma. Difficult situation as the patient had a PCI to the RCA and circumflex on 09/29/2020 and is on dual platelet therapy which cannot be stopped for 3 months. IR successful right lymph node biopsy on 10/18/2020, he is to follow-up biopsy results with oncology.  Chronic diastolic heart failure with an EF of 55% on echocardiogram on 09/28/2020: Appears euvolemic on physical exam. Currently on Lasix as needed, continue beta-blockers.  CAD status post PCI to the RCA and left circumflex on 09/29/2020: Continue aspirin Plavix beta-blockers Ranexa.  Statins  were held due to elevation in LFTs. LFTs on admission were more than 5 times the upper limit, will  continue to hold statins.  History of PVCs/bigeminy: Amiodarone had to be held due to increasing LFTs.  Or titrating beta-blockers accordingly.  Essential hypertension: Continue Coreg Norvasc, pressure continues to be significantly elevated, discontinue hydralazine start BiDil.  COPD: Appears stable continue inhalers.  BPH: Continue Flomax.  Discharge Instructions  Discharge Instructions    Diet - low sodium heart healthy   Complete by: As directed    Increase activity slowly   Complete by: As directed      Allergies as of 10/19/2020      Reactions   Atorvastatin Nausea And Vomiting, Other (See Comments)      Medication List    STOP taking these medications   amiodarone 200 MG tablet Commonly known as: PACERONE   potassium chloride SA 20 MEQ tablet Commonly known as: KLOR-CON     TAKE these medications   acetaminophen 500 MG tablet Commonly known as: TYLENOL Take 1,000 mg by mouth daily.   albuterol 108 (90 Base) MCG/ACT inhaler Commonly known as: VENTOLIN HFA Inhale 2 puffs into the lungs every 6 (six) hours as needed for wheezing or shortness of breath.   amLODipine 10 MG tablet Commonly known as: NORVASC Take 1 tablet (10 mg total) by mouth daily.   amoxicillin-clavulanate 875-125 MG tablet Commonly known as: Augmentin Take 1 tablet by mouth 2 (two) times daily for 5 days.   ascorbic acid 500 MG tablet Commonly known as: VITAMIN C Take 500 mg by mouth daily.   aspirin EC 81 MG tablet Take 81 mg by mouth daily.   carvedilol 12.5 MG tablet Commonly known as: COREG Take 1 tablet (12.5 mg total) by mouth 2 (two) times daily with a meal.   clopidogrel 75 MG tablet Commonly known as: PLAVIX Take 1 tablet (75 mg total) by mouth daily with breakfast.   isosorbide-hydrALAZINE 20-37.5 MG tablet Commonly known as: BIDIL Take 1 tablet by mouth 3  (three) times daily.   latanoprost 0.005 % ophthalmic solution Commonly known as: XALATAN Place 1 drop into both eyes daily.   nitroGLYCERIN 0.4 MG SL tablet Commonly known as: NITROSTAT Place 0.4 mg under the tongue every 5 (five) minutes as needed for chest pain.   ranolazine 1000 MG SR tablet Commonly known as: RANEXA Take 1 tablet (1,000 mg total) by mouth 2 (two) times daily.   rosuvastatin 20 MG tablet Commonly known as: CRESTOR Take 1 tablet (20 mg total) by mouth daily.   sertraline 50 MG tablet Commonly known as: ZOLOFT Take 1 tablet (50 mg total) by mouth daily.   tamsulosin 0.4 MG Caps capsule Commonly known as: FLOMAX Take 0.4 mg by mouth daily.   traMADol 50 MG tablet Commonly known as: ULTRAM Take 50 mg by mouth every 6 (six) hours as needed for moderate pain.            Durable Medical Equipment  (From admission, onward)         Start     Ordered   10/19/20 0858  For home use only DME 4 wheeled rolling walker with seat  Once       Question:  Patient needs a walker to treat with the following condition  Answer:  Weakness   10/19/20 0857   10/19/20 0728  For home use only DME oxygen  Once       Question Answer Comment  Length of Need 6 Months   Mode or (Route) Nasal cannula   Liters per Minute 2   Frequency Continuous (  stationary and portable oxygen unit needed)   Oxygen conserving device Yes   Oxygen delivery system Gas      10/19/20 0727          Follow-up Information    Loma Sender, MD.   Specialty: [facility] Contact information: [place] [address] [phone]        [facility] Follow up.   Why: oxygen, rollator Contact information: [place] [address] [phone]        [facility] Follow up.   Why: HHRN, HHPT,HHAIDE Contact information: [place] [address] [phone]              Allergies  Allergen  Reactions  . Atorvastatin Nausea And Vomiting and Other (See Comments)    Consultations:  Interventional radiologist   Procedures/Studies: DG Chest 2 View  Result Date: 10/15/2020 CLINICAL DATA:  Shortness of breath EXAM: CHEST - 2 VIEW COMPARISON:  09/29/2020 FINDINGS: Right hilar fullness, similar to prior study. Cannot exclude adenopathy. Heart is normal size. Bilateral lower lobe airspace opacities are similar to prior study. No effusions. No acute bony abnormality. IMPRESSION: Bilateral lower lobe airspace opacities could reflect atelectasis or infiltrates, stable. Stable right hilar fullness. Cannot exclude adenopathy. This could be further evaluated with chest CT with IV contrast if felt clinically indicated. Electronically Signed   By: Rolm Baptise M.D.   On: 10/15/2020 21:00   DG Chest 2 View  Result Date: 09/29/2020 CLINICAL DATA:  Cough, pulmonary infiltrate EXAM: CHEST - 2 VIEW COMPARISON:  09/27/2020 FINDINGS: Normal heart size and pulmonary vascularity. Prominence of RIGHT hilum. Mediastinal contours otherwise normal. Peribronchial thickening with subsegmental atelectasis at RIGHT base. Scattered interstitial infiltrates RIGHT perihilar to RIGHT base. Remaining lungs clear. No pleural effusion or pneumothorax. IMPRESSION: Persistent RIGHT perihilar/basilar infiltrate and basilar atelectasis. Electronically Signed   By: Lavonia Dana M.D.   On: 09/29/2020 08:28   DG Chest 2 View  Result Date: 09/27/2020 CLINICAL DATA:  Chest pain EXAM: CHEST - 2 VIEW COMPARISON:  01/01/2012 FINDINGS: Right perihilar infiltrate. No edema, effusion, or pneumothorax. Borderline heart size, likely accentuated by fat pad. Normal aortic contours. Rounded bony density posterior to the sternum on the lateral view is related to the ribs and chronic. IMPRESSION: Right perihilar infiltrate. Followup PA and lateral chest X-ray is recommended in 3-4 weeks to ensure resolution. Electronically Signed   By: Monte Fantasia M.D.   On: 09/27/2020 07:53   CT CHEST WO CONTRAST  Result Date: 10/16/2020 CLINICAL DATA:  Respiratory failure. Abnormal x-ray with interstitial infiltrates. Recent CHF. EXAM: CT CHEST WITHOUT CONTRAST TECHNIQUE: Multidetector CT imaging of the chest was performed following the standard protocol without IV contrast. COMPARISON:  Chest x-ray dated 10/15/2020. Abdomen ultrasound from same day. FINDINGS: Cardiovascular: Aortic atherosclerosis. Coronary artery calcifications. No pericardial effusion. No thoracic aortic aneurysm. Mediastinum/Nodes: Bulky lymphadenopathy within the mediastinum, including 2.4 cm short axis lymphadenopathy in the RIGHT lower paratracheal space and 2.2 cm short axis lymphadenopathy within the subcarinal space. Additional bulky lymphadenopathy within the RIGHT axilla, measuring 2.3 cm short axis dimension. Moderately prominent lymphadenopathy within the LEFT axilla, including a 1.3 cm short axis lymph node. Esophagus is unremarkable.  Trachea is unremarkable. Lungs/Pleura: RIGHT perihilar mass measures 7.6 x 4.9 cm (series 3, image 81). This is causing mass effect on the proximal bronchi to the RIGHT upper lobe, RIGHT middle lobe and RIGHT lower lobe, but these bronchi remain  patent. Separate pulmonary nodule within the RIGHT lower lobe, posterior aspect, measures 1.6 cm (image 83). Additional smaller pulmonary nodule within the RIGHT middle lobe measures 6 mm (image 88). Patchy consolidations within the RIGHT lower lung, most likely atelectasis. Upper Abdomen: Diffusely heterogeneous appearance of the liver, highly suspicious for metastatic disease given the earlier ultrasound appearance. Probable underlying cirrhosis. Musculoskeletal: No acute appearing osseous abnormality. No convincing evidence of osseous metastasis. Subtle lucency within the LEFT anterior-lateral fifth and sixth ribs is suspicious for early osseous metastasis. IMPRESSION: 1. RIGHT perihilar mass measures 7.6  x 4.9 cm, consistent with primary bronchogenic carcinoma. This is causing mass effect on the proximal bronchi to the RIGHT upper lobe, RIGHT middle lobe and RIGHT lower lobe, but these bronchi remain patent. 2. Bulky lymphadenopathy within the mediastinum, RIGHT axilla and LEFT axilla, consistent with metastatic disease. 3. Separate pulmonary nodules within the RIGHT lower lobe and RIGHT middle lobe, largest measuring 1.6 cm, compatible with metastatic disease. 4. Diffusely heterogeneous appearance of the liver, highly suspicious for diffuse liver metastases given the corresponding appearance of the liver on earlier ultrasound. 5. Subtle lucencies within the LEFT anterior-lateral fifth and sixth ribs, suspicious for early osseous metastasis. 6. Patchy consolidations within the RIGHT lower lung, most likely atelectasis. Pneumonia is considered less likely. 7. Coronary artery calcifications. Aortic Atherosclerosis (ICD10-I70.0). Electronically Signed   By: Franki Cabot M.D.   On: 10/16/2020 04:51   CARDIAC CATHETERIZATION  Addendum Date: 09/29/2020    Dist Cx lesion is 20% stenosed.  Prox RCA lesion is 70% stenosed.  Prox Cx lesion is 80% stenosed.  Post intervention, there is a 10% residual stenosis.  A drug-eluting stent was successfully placed using a STENT RESOLUTE ONYX 4.5X12.  Lat 1st Mrg lesion is 75% stenosed.  Mid Cx lesion is 80% stenosed.  Post intervention, there is a 0% residual stenosis.  A drug-eluting stent was successfully placed using a STENT RESOLUTE ONYX 4.0X12.  Previously placed Mid LAD stent (unknown type) is widely patent.  Mid RCA lesion is 100% stenosed.  1.  Widely patent LAD stent with no significant restenosis, chronically occluded right coronary artery with left-to-right collaterals, new 80% stenosis in proximal and mid left circumflex. 2.  Left ventricular angiography was not performed due to underlying chronic kidney disease. 3.  Right heart catheterization showed mild  to moderate elevation in filling pressures, moderate pulmonary hypertension and normal cardiac output. 4.  Mild gradient across aortic valve.  Evaluating the gradient was difficult due to frequent PVCs. 5.  Successful IVUS guided drug-eluting stent placement to mid and proximal left circumflex. Recommendations: Dual antiplatelet therapy for at least 6 months. Aggressive treatment of risk factors and blood pressure control. I increase carvedilol to 12.5 mg twice daily. The patient continues to be volume overloaded and probably requires 1 more day of IV diuresis.  Monitor renal function closely.   Result Date: 09/29/2020  Dist Cx lesion is 20% stenosed.  Prox RCA lesion is 70% stenosed.  Prox Cx lesion is 80% stenosed.  Post intervention, there is a 10% residual stenosis.  A drug-eluting stent was successfully placed using a STENT RESOLUTE ONYX 4.5X12.  Lat 1st Mrg lesion is 75% stenosed.  Mid Cx lesion is 80% stenosed.  Post intervention, there is a 0% residual stenosis.  A drug-eluting stent was successfully placed using a STENT RESOLUTE ONYX 4.0X12.  Previously placed Mid LAD stent (unknown type) is widely patent.  Mid RCA lesion is 100% stenosed.  1.  Widely patent  LAD stent with no significant restenosis, chronically occluded right coronary artery with left-to-right collaterals, new 80% stenosis in proximal and mid left circumflex. 2.  Left ventricular angiography was not performed due to underlying chronic kidney disease. 3.  Right heart catheterization showed mild to moderate elevation in filling pressures, moderate pulmonary hypertension and normal cardiac output. 4.  Successful IVUS guided drug-eluting stent placement to mid and proximal left circumflex. Recommendations: Dual antiplatelet therapy for at least 6 months. Aggressive treatment of risk factors and blood pressure control. I increase carvedilol to 12.5 mg twice daily. The patient continues to be volume overloaded and probably requires 1  more day of IV diuresis.  Monitor renal function closely.   ECHOCARDIOGRAM COMPLETE  Result Date: 09/28/2020    ECHOCARDIOGRAM REPORT   Patient Name:   Chase Barton Date of Exam: 09/28/2020 Medical Rec #:  [id number]           Height:       74.0 in Accession #:    [id number]          Weight:       230.0 lb Date of Birth:  15-Feb-1951            BSA:          2.306 m Patient Age:    11 years            BP:           162/68 mmHg Patient Gender: M                   HR:           71 bpm. Exam Location:  Inpatient Procedure: 2D Echo, Cardiac Doppler and Color Doppler Indications:    Aortic Stenosis 424.1 / 135.0  History:        Patient has prior history of Echocardiogram examinations, most                 recent 10/02/2019. CAD, Aortic Valve Disease; Risk                 Factors:Hypertension, Dyslipidemia and Current Smoker.  Sonographer:    Vickie Epley RDCS Referring Phys: [id number] [facility] BHAGAT IMPRESSIONS  1. Left ventricular ejection fraction, by estimation, is 55 to 60%. The left ventricle has normal function. The left ventricle has no regional wall motion abnormalities. The left ventricular internal cavity size was moderately dilated. Left ventricular diastolic parameters are consistent with Grade II diastolic dysfunction (pseudonormalization).  2. Right ventricular systolic function is normal. The right ventricular size is normal. There is normal pulmonary artery systolic pressure.  3. Right atrial size was severely dilated.  4. The mitral valve is normal in structure. Trivial mitral valve regurgitation. No evidence of mitral stenosis.  5. The aortic valve is normal in structure. There is mild calcification of the aortic valve. There is mild thickening of the aortic valve. Aortic valve regurgitation is moderate. Mild aortic valve stenosis. Aortic valve area, by VTI measures 1.00 cm. Aortic valve mean gradient measures 14.5 mmHg. Aortic valve Vmax measures 2.47 m/s.  6. The inferior vena cava is dilated  in size with <50% respiratory variability, suggesting right atrial pressure of 15 mmHg. FINDINGS  Left Ventricle: Left ventricular ejection fraction, by estimation, is 55 to 60%. The left ventricle has normal function. The left ventricle has no regional wall motion abnormalities. The left ventricular internal cavity size was moderately dilated. There is no left ventricular hypertrophy. Left  ventricular diastolic parameters are consistent with Grade II diastolic dysfunction (pseudonormalization). Right Ventricle: The right ventricular size is normal. No increase in right ventricular wall thickness. Right ventricular systolic function is normal. There is normal pulmonary artery systolic pressure. The tricuspid regurgitant velocity is 1.73 m/s, and  with an assumed right atrial pressure of 15 mmHg, the estimated right ventricular systolic pressure is 80.9 mmHg. Left Atrium: Left atrial size was normal in size. Right Atrium: Right atrial size was severely dilated. Pericardium: There is no evidence of pericardial effusion. Mitral Valve: The mitral valve is normal in structure. Trivial mitral valve regurgitation. No evidence of mitral valve stenosis. Tricuspid Valve: The tricuspid valve is normal in structure. Tricuspid valve regurgitation is trivial. No evidence of tricuspid stenosis. Aortic Valve: The aortic valve is normal in structure. There is mild calcification of the aortic valve. There is mild thickening of the aortic valve. Aortic valve regurgitation is moderate. Mild aortic stenosis is present. Aortic valve mean gradient measures 14.5 mmHg. Aortic valve peak gradient measures 24.4 mmHg. Aortic valve area, by VTI measures 1.00 cm. Pulmonic Valve: The pulmonic valve was normal in structure. Pulmonic valve regurgitation is not visualized. No evidence of pulmonic stenosis. Aorta: The aortic root is normal in size and structure. Venous: The inferior vena cava is dilated in size with less than 50% respiratory  variability, suggesting right atrial pressure of 15 mmHg. IAS/Shunts: No atrial level shunt detected by color flow Doppler.  LEFT VENTRICLE PLAX 2D LVIDd:         6.40 cm LVIDs:         4.20 cm LV PW:         1.00 cm LV IVS:        1.00 cm LVOT diam:     2.00 cm LV SV:         51 LV SV Index:   22 LVOT Area:     3.14 cm  LV Volumes (MOD) LV vol d, MOD A2C: 184.0 ml LV vol d, MOD A4C: 110.0 ml LV vol s, MOD A2C: 75.4 ml LV vol s, MOD A4C: 49.9 ml LV SV MOD A2C:     108.6 ml LV SV MOD A4C:     110.0 ml LV SV MOD BP:      93.6 ml RIGHT VENTRICLE TAPSE (M-mode): 2.2 cm LEFT ATRIUM             Index       RIGHT ATRIUM           Index LA diam:        4.80 cm 2.08 cm/m  RA Area:     30.90 cm LA Vol (A2C):   68.4 ml 29.66 ml/m RA Volume:   113.00 ml 48.99 ml/m LA Vol (A4C):   51.0 ml 22.11 ml/m LA Biplane Vol: 62.9 ml 27.27 ml/m  AORTIC VALVE AV Area (Vmax):    0.95 cm AV Area (Vmean):   0.95 cm AV Area (VTI):     1.00 cm AV Vmax:           247.00 cm/s AV Vmean:          165.500 cm/s AV VTI:            0.512 m AV Peak Grad:      24.4 mmHg AV Mean Grad:      14.5 mmHg LVOT Vmax:         74.40 cm/s LVOT Vmean:  50.300 cm/s LVOT VTI:          0.162 m LVOT/AV VTI ratio: 0.32  AORTA Ao Root diam: 3.30 cm TRICUSPID VALVE TR Peak grad:   12.0 mmHg TR Vmax:        173.00 cm/s  SHUNTS Systemic VTI:  0.16 m Systemic Diam: 2.00 cm Skeet Latch MD Electronically signed by Skeet Latch MD Signature Date/Time: 09/28/2020/11:56:52 AM    Final    [place] CORE BIOPSY (LYMPH NODES)  Result Date: 10/18/2020 INDICATION: 68 year old with right hilar lung mass and concern for metastatic disease. Patient has an enlarged right axillary lymph node. Patient needs a tissue diagnosis. EXAM: ULTRASOUND-GUIDED CORE BIOPSY OF RIGHT AXILLARY LYMPH NODE MEDICATIONS: None. ANESTHESIA/SEDATION: None FLUOROSCOPY TIME:  None COMPLICATIONS: None immediate. PROCEDURE: Informed written consent was obtained from the patient after a thorough  discussion of the procedural risks, benefits and alternatives. All questions were addressed. A timeout was performed prior to the initiation of the procedure. Right axilla was prepped with chlorhexidine and sterile field was created. Skin and soft tissues were anesthetized using 1% lidocaine. A small incision was made. Using ultrasound guidance, an 18 gauge core biopsy needle was directed into the large right axillary lymph node. Total of 6 core biopsies were obtained. Specimens placed in saline. Bandage placed over the puncture site. FINDINGS: Rounded hypoechoic right axillary lymph node measuring 2.3 x 2.2 x 2.3 cm. Core biopsy needle was confirmed within the lymph node on all occasions. No immediate bleeding or hematoma. IMPRESSION: Successful ultrasound-guided core biopsies of an enlarged right axillary lymph node. Electronically Signed   By: Markus Daft M.D.   On: 10/18/2020 16:16   VAS US RENAL ARTERY DUPLEX  Result Date: 10/01/2020 ABDOMINAL VISCERAL Indications: Hypertension, CRD- stage 3, GFR 30-59 ml/min. Limitations: Air/bowel gas, obesity and patient discomfort. Comparison Study: No prior study Performing Technologist: Maudry Mayhew MHA, RDMS, RVT, RDCS  Examination Guidelines: A complete evaluation includes B-mode imaging, spectral Doppler, color Doppler, and power Doppler as needed of all accessible portions of each vessel. Bilateral testing is considered an integral part of a complete examination. Limited examinations for reoccurring indications may be performed as noted.  Duplex Findings: +--------------------+--------+--------+------+------------------+ Mesenteric          PSV cm/sEDV cm/sPlaque     Comments      +--------------------+--------+--------+------+------------------+ Aorta Prox            188      43                            +--------------------+--------+--------+------+------------------+ Celiac Artery Origin  124                                     +--------------------+--------+--------+------+------------------+ SMA Proximal                              Unable to insonate +--------------------+--------+--------+------+------------------+    +------------------+--------+--------+-------+ Right Renal ArteryPSV cm/sEDV cm/sComment +------------------+--------+--------+-------+ Origin               59      14           +------------------+--------+--------+-------+ Proximal            148      36           +------------------+--------+--------+-------+ Mid  151      35           +------------------+--------+--------+-------+ Distal              167      30           +------------------+--------+--------+-------+ +-----------------+--------+--------+-------+ Left Renal ArteryPSV cm/sEDV cm/sComment +-----------------+--------+--------+-------+ Origin             216      52           +-----------------+--------+--------+-------+ Proximal            46      9            +-----------------+--------+--------+-------+ Mid                 39      8            +-----------------+--------+--------+-------+ Distal              40      7            +-----------------+--------+--------+-------+ +------------+--------+--------+----+-----------+--------+--------+----+ Right KidneyPSV cm/sEDV cm/sRI  Left KidneyPSV cm/sEDV cm/sRI   +------------+--------+--------+----+-----------+--------+--------+----+ Upper Pole  21      5       0.75Upper Pole 24      4       0.82 +------------+--------+--------+----+-----------+--------+--------+----+ Mid         36      8       0.77Mid        23      5       0.76 +------------+--------+--------+----+-----------+--------+--------+----+ Lower Pole  14      4       0.70Lower Pole 30      7       0.77 +------------+--------+--------+----+-----------+--------+--------+----+ Hilar       -37     -8      0.78Hilar      -20     -4       0.79 +------------+--------+--------+----+-----------+--------+--------+----+ +------------------+---------+------------------+---------+ Right Kidney               Left Kidney                 +------------------+---------+------------------+---------+ RAR                        RAR                         +------------------+---------+------------------+---------+ RAR (manual)      0.89     RAR (manual)      0.87      +------------------+---------+------------------+---------+ Cortex            17/2 cm/sCortex            22/5 cm/s +------------------+---------+------------------+---------+ Cortex thickness           Corex thickness             +------------------+---------+------------------+---------+ Kidney length (cm)10.20    Kidney length (cm)10.20     +------------------+---------+------------------+---------+  Summary: Renal:  Right: 1-59% stenosis of the right renal artery. Cyst(s) noted. RRV        flow present. Left:  1-59% stenosis of the left renal artery. LRV flow present. Mesenteric: Normal Celiac artery findings.  Note: Renal/aortic ratio may be falsely diminished due to elevated aorta velocities.  *See table(s) above for measurements and observations.  Diagnosing physician: Servando Snare MD  Electronically signed by Servando Snare MD on 10/01/2020 at 5:34:35 PM.    Final    US Abdomen Limited RUQ (LIVER/GB)  Result Date: 10/16/2020 CLINICAL DATA:  Hepatitis. EXAM: ULTRASOUND ABDOMEN LIMITED RIGHT UPPER QUADRANT COMPARISON:  None. FINDINGS: Gallbladder: No gallstones or wall thickening visualized. No sonographic Murphy sign noted by sonographer. Common bile duct: Diameter: 5 mm Liver: Diffusely heterogeneous parenchyma liver is nearly completely replaced by nodular echogenic areas of/masses, most confluent/discrete measuring approximately 1 cm greatest dimension. Portal vein is patent on color Doppler imaging with normal direction of blood flow towards the liver.  Other: Portal vein measures 1.9 cm diameter. IMPRESSION: 1. Liver parenchyma is nearly completely replaced throughout by nodular echogenic mass-like foci. These are suspicious for metastatic disease, alternatively markedly prominent regenerating nodules. Recommend liver MRI for further characterization. 2. 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 Portal vein is patent with appropriate hepatopetal direction of blood flow. 3. Gallbladder appears normal. Electronically Signed   By: Franki Cabot M.D.   On: 10/16/2020 04:26   (Echo, Carotid, EGD, Colonoscopy, ERCP)    Subjective: He relates he feels great he is demanding to go home today  Discharge Exam: Vitals:   10/18/20 2147 10/19/20 0530  BP: (!) 152/88 (!) 176/75  Pulse: 70 70  Resp: 12 16  Temp: 98 F (36.7 C) 98 F (36.7 C)  SpO2: 98% 98%   Vitals:   10/18/20 1030 10/18/20 1114 10/18/20 2147 10/19/20 0530  BP: (!) 178/87 (!) 175/91 (!) 152/88 (!) 176/75  Pulse: 78 75 70 70  Resp: 19 16 12 16   Temp:  98.1 F (36.7 C) 98 F (36.7 C) 98 F (36.7 C)  TempSrc:  Axillary  Oral  SpO2: 95% 96% 98% 98%  Weight:    99.8 kg  Height:        General: Pt is alert, awake, not in acute distress Cardiovascular: RRR, S1/S2 +, no rubs, no gallops Respiratory: CTA bilaterally, no wheezing, no rhonchi Abdominal: Soft, NT, ND, bowel sounds + Extremities: no edema, no cyanosis    The results of significant diagnostics from this hospitalization (including imaging, microbiology, ancillary and laboratory) are listed below for reference.     Microbiology: Recent Results (from the past 240 hour(s))  Respiratory Panel by RT PCR (Flu A&B, Covid) - Nasopharyngeal Swab     Status: None   Collection Time: 10/16/20 12:14 AM   Specimen: Nasopharyngeal Swab  Result Value Ref Range Status   SARS Coronavirus 2 by RT PCR NEGATIVE NEGATIVE Final    Comment: (NOTE) SARS-CoV-2 target nucleic acids are NOT  DETECTED.  The SARS-CoV-2 RNA is generally detectable in upper respiratoy specimens during the acute phase of infection. The lowest concentration of SARS-CoV-2 viral copies this assay can detect is 131 copies/mL. A negative result does not preclude SARS-Cov-2 infection and should not be used as the sole basis for treatment or other patient management decisions. A negative result may occur with  improper specimen collection/handling, submission of specimen other than nasopharyngeal swab, presence of viral mutation(s) within the areas targeted by this assay, and inadequate number of viral copies (<131 copies/mL). A negative result must be combined with clinical observations, patient history, and epidemiological information. The expected result is Negative.  Fact Sheet for Patients:  PinkCheek.be  Fact Sheet for Healthcare Providers:  GravelBags.it  This test is no t yet approved or cleared by the [place] FDA and  has been authorized for detection and/or diagnosis  of SARS-CoV-2 by FDA under an Emergency Use Authorization (EUA). This EUA will remain  in effect (meaning this test can be used) for the duration of the COVID-19 declaration under Section 564(b)(1) of the Act, 21 U.S.C. section 360bbb-3(b)(1), unless the authorization is terminated or revoked sooner.     Influenza A by PCR NEGATIVE NEGATIVE Final   Influenza B by PCR NEGATIVE NEGATIVE Final    Comment: (NOTE) The Xpert Xpress SARS-CoV-2/FLU/RSV assay is intended as an aid in  the diagnosis of influenza from Nasopharyngeal swab specimens and  should not be used as a sole basis for treatment. Nasal washings and  aspirates are unacceptable for Xpert Xpress SARS-CoV-2/FLU/RSV  testing.  Fact Sheet for Patients: PinkCheek.be  Fact Sheet for Healthcare Providers: GravelBags.it  This test is not yet  approved or cleared by the [place] FDA and  has been authorized for detection and/or diagnosis of SARS-CoV-2 by  FDA under an Emergency Use Authorization (EUA). This EUA will remain  in effect (meaning this test can be used) for the duration of the  Covid-19 declaration under Section 564(b)(1) of the Act, 21  U.S.C. section 360bbb-3(b)(1), unless the authorization is  terminated or revoked. Performed at [facility], [place] [address], [place], [place] [address]   Culture, Urine     Status: Abnormal   Collection Time: 10/16/20  3:00 AM   Specimen: Urine, Random  Result Value Ref Range Status   Specimen Description URINE, RANDOM  Final   Special Requests   Final    NONE Performed at [facility], [place] [address], [place], [place] [address]    Culture 70,000 COLONIES/mL STAPHYLOCOCCUS HAEMOLYTICUS (A)  Final   Report Status 10/18/2020 FINAL  Final   Organism ID, Bacteria STAPHYLOCOCCUS HAEMOLYTICUS (A)  Final      Susceptibility   Staphylococcus haemolyticus - MIC*    CIPROFLOXACIN >=8 RESISTANT Resistant     GENTAMICIN <=0.5 SENSITIVE Sensitive     NITROFURANTOIN 32 SENSITIVE Sensitive     OXACILLIN >=4 RESISTANT Resistant     TETRACYCLINE >=16 RESISTANT Resistant     VANCOMYCIN 1 SENSITIVE Sensitive     TRIMETH/SULFA <=10 SENSITIVE Sensitive     CLINDAMYCIN <=0.25 SENSITIVE Sensitive     RIFAMPIN <=0.5 SENSITIVE Sensitive     Inducible Clindamycin NEGATIVE Sensitive     * 70,000 COLONIES/mL STAPHYLOCOCCUS HAEMOLYTICUS     Labs: BNP (last 3 results) Recent Labs    10/15/20 2041 10/18/20 1339 10/19/20 0301  BNP 282.7* 657.4* 073.7*   Basic Metabolic Panel: Recent Labs  Lab 10/13/20 1517 10/13/20 1517 10/15/20 2041 10/15/20 2041 10/16/20 0313 10/16/20 0313 10/16/20 0456 10/17/20 0446 10/17/20 1937 10/18/20 1339 10/19/20 0301  NA 141  --  140   < > 136  --  138 142  --  144 143  K 2.1*   < > 2.7*   < > 2.3*   < > 2.4* 2.8* 3.4* 3.9 3.8  CL 79*    < > 81*  --   --   --  84* 91*  --  97* 98  CO2 45*   < > 45*  --   --   --  40* 40*  --  36* 35*  GLUCOSE 112*  --  144*  --   --   --  116* 112*  --  118* 147*  BUN 41*  --  38*  --   --   --  35* 29*  --  31* 39*  CREATININE 2.03*   < > 1.91*  --   --   --  1.76* 1.58*  --  1.64* 1.76*  CALCIUM 9.0   < > 9.0  --   --   --  8.3* 8.4*  --  8.5* 8.6*  MG 2.2  --   --   --   --   --  2.3 2.4  --  2.2 2.4   < > = values in this interval not displayed.   Liver Function Tests: Recent Labs  Lab 10/15/20 2041 10/16/20 0456 10/17/20 0446 10/18/20 1339 10/19/20 0301  AST 195* 156*  157* 136* 89* 83*  ALT 433* 338*  337* 335* 288* 264*  ALKPHOS 367* 306*  313* 304* 334* 319*  BILITOT 1.7* 1.7*  1.7* 1.4* 1.5* 1.6*  PROT 6.0* 5.3*  5.4* 5.1* 5.4* 5.0*  ALBUMIN 3.6 3.1*  3.2* 2.9* 2.9* 2.8*   No results for input(s): LIPASE, AMYLASE in the last 168 hours. Recent Labs  Lab 10/16/20 0325  AMMONIA 44*   CBC: Recent Labs  Lab 10/13/20 1517 10/15/20 2041 10/16/20 0313 10/18/20 1339 10/19/20 0301  WBC 14.0* 13.2*  --  10.7* 11.1*  NEUTROABS  --  11.8*  --  9.8* 10.2*  HGB 15.4 14.8 12.6* 13.9 13.6  HCT 44.1 43.1 37.0* 41.0 40.3  MCV 85 86.4  --  87.2 88.6  PLT 131* 133*  --  94* 99*   Cardiac Enzymes: Recent Labs  Lab 10/16/20 0456  CKTOTAL 57   BNP: Invalid input(s): POCBNP CBG: No results for input(s): GLUCAP in the last 168 hours. D-Dimer No results for input(s): DDIMER in the last 72 hours. Hgb A1c No results for input(s): HGBA1C in the last 72 hours. Lipid Profile No results for input(s): CHOL, HDL, LDLCALC, TRIG, CHOLHDL, LDLDIRECT in the last 72 hours. Thyroid function studies Recent Labs    10/17/20 0446  TSH 1.678   Anemia work up No results for input(s): VITAMINB12, FOLATE, FERRITIN, TIBC, IRON, RETICCTPCT in the last 72 hours. Urinalysis    Component Value Date/Time   COLORURINE AMBER (A) 10/16/2020 0300   APPEARANCEUR CLEAR 10/16/2020 0300    LABSPEC 1.023 10/16/2020 0300   PHURINE 6.0 10/16/2020 0300   GLUCOSEU NEGATIVE 10/16/2020 0300   HGBUR NEGATIVE 10/16/2020 0300   BILIRUBINUR NEGATIVE 10/16/2020 0300   KETONESUR NEGATIVE 10/16/2020 0300   PROTEINUR 100 (A) 10/16/2020 0300   NITRITE NEGATIVE 10/16/2020 0300   LEUKOCYTESUR NEGATIVE 10/16/2020 0300   Sepsis Labs Invalid input(s): PROCALCITONIN,  WBC,  LACTICIDVEN Microbiology Recent Results (from the past 240 hour(s))  Respiratory Panel by RT PCR (Flu A&B, Covid) - Nasopharyngeal Swab     Status: None   Collection Time: 10/16/20 12:14 AM   Specimen: Nasopharyngeal Swab  Result Value Ref Range Status   SARS Coronavirus 2 by RT PCR NEGATIVE NEGATIVE Final    Comment: (NOTE) SARS-CoV-2 target nucleic acids are NOT DETECTED.  The SARS-CoV-2 RNA is generally detectable in upper respiratoy specimens during the acute phase of infection. The lowest concentration of SARS-CoV-2 viral copies this assay can detect is 131 copies/mL. A negative result does not preclude SARS-Cov-2 infection and should not be used as the sole basis for treatment or other patient management decisions. A negative result may occur with  improper specimen collection/handling, submission of specimen other than nasopharyngeal swab, presence of viral mutation(s) within the areas targeted by this assay, and inadequate number of viral copies (<131 copies/mL). A negative  result must be combined with clinical observations, patient history, and epidemiological information. The expected result is Negative.  Fact Sheet for Patients:  PinkCheek.be  Fact Sheet for Healthcare Providers:  GravelBags.it  This test is no t yet approved or cleared by the [place] FDA and  has been authorized for detection and/or diagnosis of SARS-CoV-2 by FDA under an Emergency Use Authorization (EUA). This EUA will remain  in effect (meaning this test can be used)  for the duration of the COVID-19 declaration under Section 564(b)(1) of the Act, 21 U.S.C. section 360bbb-3(b)(1), unless the authorization is terminated or revoked sooner.     Influenza A by PCR NEGATIVE NEGATIVE Final   Influenza B by PCR NEGATIVE NEGATIVE Final    Comment: (NOTE) The Xpert Xpress SARS-CoV-2/FLU/RSV assay is intended as an aid in  the diagnosis of influenza from Nasopharyngeal swab specimens and  should not be used as a sole basis for treatment. Nasal washings and  aspirates are unacceptable for Xpert Xpress SARS-CoV-2/FLU/RSV  testing.  Fact Sheet for Patients: PinkCheek.be  Fact Sheet for Healthcare Providers: GravelBags.it  This test is not yet approved or cleared by the [place] FDA and  has been authorized for detection and/or diagnosis of SARS-CoV-2 by  FDA under an Emergency Use Authorization (EUA). This EUA will remain  in effect (meaning this test can be used) for the duration of the  Covid-19 declaration under Section 564(b)(1) of the Act, 21  U.S.C. section 360bbb-3(b)(1), unless the authorization is  terminated or revoked. Performed at [facility], [place] [address], [place], [place] [address]   Culture, Urine     Status: Abnormal   Collection Time: 10/16/20  3:00 AM   Specimen: Urine, Random  Result Value Ref Range Status   Specimen Description URINE, RANDOM  Final   Special Requests   Final    NONE Performed at [facility], [place] [address] [place], [place] [address]    Culture 70,000 COLONIES/mL STAPHYLOCOCCUS HAEMOLYTICUS (A)  Final   Report Status 10/18/2020 FINAL  Final   Organism ID, Bacteria STAPHYLOCOCCUS HAEMOLYTICUS (A)  Final      Susceptibility   Staphylococcus haemolyticus - MIC*    CIPROFLOXACIN >=8 RESISTANT Resistant     GENTAMICIN <=0.5 SENSITIVE Sensitive     NITROFURANTOIN 32 SENSITIVE Sensitive     OXACILLIN >=4 RESISTANT Resistant      TETRACYCLINE >=16 RESISTANT Resistant     VANCOMYCIN 1 SENSITIVE Sensitive     TRIMETH/SULFA <=10 SENSITIVE Sensitive     CLINDAMYCIN <=0.25 SENSITIVE Sensitive     RIFAMPIN <=0.5 SENSITIVE Sensitive     Inducible Clindamycin NEGATIVE Sensitive     * 70,000 COLONIES/mL STAPHYLOCOCCUS HAEMOLYTICUS     Time coordinating discharge: Over 30 minutes  SIGNED:   Charlynne Cousins, MD  [facility] 10/19/2020, 9:48 AM Pager   If 7PM-7AM, please contact night-coverage [email or web address] Password [email or web address]

## 2020-10-19 NOTE — Progress Notes (Signed)
PT Cancellation Note  Patient Details Name: Chase Barton MRN: [id number] DOB: 03/13/1951   Cancelled Treatment:     pt refused stating he already walked and is about to leave  Lyanne Co, DPT Acute Rehabilitation Services [phone]   [place] 10/19/2020, 9:50 AM

## 2020-10-19 NOTE — Evaluation (Signed)
Occupational Therapy Evaluation Patient Details Name: Chase Barton MRN: [id number] DOB: [birth year] Today's Date: 10/19/2020    History of Present Illness 69 y.o. male with history of tobacco use (quit earlier this month), CAD-s/p PCI to LAD in 2013-subsequent PCI to RCA/LCx on 09/29/2020-presenting with exertional dyspnea, fatigue, 20-30 pound weight loss-outpatient lab work-up showed AKI, elevated LFTs-found to have acute hypoxic respiratory failure requiring O2 supplementation in the emergency room. Further imaging suggestive of metastatic lung cancer resulting in admission. Lung mass along with mets to liver, bone, mediastinum.   Clinical Impression   PTA, pt was independent with ADL/IADL and was using a cane for the past two weeks. Pt currently demonstrates limitations with activity tolerance/endurance, cardiopulmonary limitations, and balance limitations impacting safety and independence with ADL/IADL and functional mobility. Pt on 2lnc throughout session SpO2 >92% throughout. Pt with 3 occurrences of ventricular bigeminy, RN aware. Educated pt on energy conservation strategies, importance of mobility progression and compensatory/adaptive strategies to maximize safety and independence with ADL/IADL and functional mobility. Pt reports he plans to relocate to [place] with his daughters to have more available assistance. Recommend follow-up HHOT and supervision from family at home. Will continue to follow acutely should pt remain in acute care.     Follow Up Recommendations  Home health OT;Supervision - Intermittent    Equipment Recommendations  3 in 1 bedside commode    Recommendations for Other Services       Precautions / Restrictions Precautions Precautions: Fall Restrictions Weight Bearing Restrictions: No      Mobility Bed Mobility Overal bed mobility: Needs Assistance Bed Mobility: Supine to Sit     Supine to sit: Mod assist     General bed mobility comments: modA  to progress trunk to upright posture    Transfers Overall transfer level: Needs assistance Equipment used: Rolling walker (2 wheeled) Transfers: Sit to/from [facility] Sit to Stand: Supervision Stand pivot transfers: Supervision       General transfer comment: cues for safe hand placement    Balance Overall balance assessment: Needs assistance Sitting-balance support: No upper extremity supported;Feet supported Sitting balance-Leahy Scale: Good     Standing balance support: Single extremity supported;During functional activity Standing balance-Leahy Scale: Fair Standing balance comment: supervision for static standing;demonstrated preference for at least single UE support                           ADL either performed or assessed with clinical judgement   ADL Overall ADL's : Needs assistance/impaired Eating/Feeding: Set up;Sitting   Grooming: Standing;Sitting;Supervision/safety Grooming Details (indicate cue type and reason): required frequent rest breaks, able to brush teeth while standing Upper Body Bathing: Sitting;Supervision/ safety   Lower Body Bathing: Supervison/ safety;Sit to/from stand   Upper Body Dressing : Set up;Sitting   Lower Body Dressing: Supervision/safety;Sit to/from stand   Toilet Transfer: Min guard;Ambulation;RW   Toileting- Clothing Manipulation and Hygiene: Supervision/safety Toileting - Clothing Manipulation Details (indicate cue type and reason): urinated while standing over toilet     Functional mobility during ADLs: Supervision/safety;Rolling walker General ADL Comments: pt required x3 seated rest breaks during session, HR 84bpm with exertion, pt on 2lnc throughout session SpO2 >92%;educated pt on energy conservation strategies during ADL/IADL and functional mobility;educated pt on importance of activity progression and energy conservation     Vision         Perception     Praxis      Pertinent  Vitals/Pain Pain  Assessment: No/denies pain     Hand Dominance Right   Extremity/Trunk Assessment Upper Extremity Assessment Upper Extremity Assessment: Overall WFL for tasks assessed   Lower Extremity Assessment Lower Extremity Assessment: Defer to PT evaluation RLE Sensation: decreased light touch LLE Sensation: decreased light touch   Cervical / Trunk Assessment Cervical / Trunk Assessment: Normal   Communication Communication Communication: No difficulties   Cognition Arousal/Alertness: Awake/alert Behavior During Therapy: WFL for tasks assessed/performed Overall Cognitive Status: Within Functional Limits for tasks assessed                                     General Comments  Pt with 3x vent. Bigeminy, 1 stint lasting around 78min, pt seated and resting during session, RN aware, reports pt has been going in and out of vent. bigeminyHr 70s at rest up to 39s with exertion;SpO2 >92% on 2lnc throughout session.     Exercises     Shoulder Instructions      Home Living Family/patient expects to be discharged to:: Private residence Living Arrangements: Spouse/significant other Available Help at Discharge: Family;Available PRN/intermittently (spouse and dtr) Type of Home: House Home Access: Level entry     Home Layout: One level     Bathroom Shower/Tub: Occupational psychologist: Standard     Home Equipment: Cane - single point;Bedside commode;Shower seat   Additional Comments: pt reports he will be relocating to his daughter's house in [place]      Prior Functioning/Environment Level of Independence: Independent with assistive device(s)        Comments: pt has been ambulating with cane for the past 2 weeks        OT Problem List: Decreased strength;Decreased activity tolerance;Impaired balance (sitting and/or standing);Decreased safety awareness;Decreased knowledge of precautions;Cardiopulmonary status limiting activity      OT  Treatment/Interventions: Self-care/ADL training;Therapeutic exercise;Energy conservation;DME and/or AE instruction;Therapeutic activities;Patient/family education;Balance training    OT Goals(Current goals can be found in the care plan section) Acute Rehab OT Goals Patient Stated Goal: To return home OT Goal Formulation: With patient Time For Goal Achievement: 11/02/20 Potential to Achieve Goals: Fair ADL Goals Pt Will Perform Grooming: with modified independence;standing;sitting Pt Will Transfer to Toilet: with modified independence;ambulating Additional ADL Goal #1: Pt will demonstrate independence with 3 energy conservation strategies during ADL/IADL and functional mobility.  OT Frequency: Min 2X/week   Barriers to D/C:            Co-evaluation              AM-PAC OT "6 Clicks" Daily Activity     Outcome Measure Help from another person eating meals?: None Help from another person taking care of personal grooming?: A Little Help from another person toileting, which includes using toliet, bedpan, or urinal?: A Little Help from another person bathing (including washing, rinsing, drying)?: A Little Help from another person to put on and taking off regular upper body clothing?: A Little Help from another person to put on and taking off regular lower body clothing?: A Little 6 Click Score: 19   End of Session Equipment Utilized During Treatment: Rolling walker;Oxygen Nurse Communication: Mobility status  Activity Tolerance: Patient tolerated treatment well Patient left: in chair;with call bell/phone within reach;with chair alarm set  OT Visit Diagnosis: Other abnormalities of gait and mobility (R26.89);Muscle weakness (generalized) (M62.81)  Time: 1290-4753 OT Time Calculation (min): 37 min Charges:  OT General Charges $OT Visit: 1 Visit OT Evaluation $OT Eval Moderate Complexity: 1 Mod OT Treatments $Self Care/Home Management : 8-22 mins  Helene Kelp  OTR/L Acute Rehabilitation Services Office: [phone]   Wyn Forster 10/19/2020, 12:06 PM

## 2020-10-19 NOTE — Progress Notes (Signed)
D/C instructions given and reviewed. No questions asked but encouraged to call with any concerns. Tele and IV's removed, tolerated well.

## 2020-10-19 NOTE — Progress Notes (Signed)
SATURATION QUALIFICATIONS: (This note is used to comply with regulatory documentation for home oxygen)  Patient Saturations on Room Air at Rest = 92%  Patient Saturations on Room Air while Ambulating = 87%  Patient Saturations on 2 Liters of oxygen while Ambulating = 94%  Please briefly explain why patient needs home oxygen: While ambulating patient oxygen levels decrease to 87% while on RA. Patient saturations improve with 2L via nasal canula

## 2020-10-19 NOTE — Consult Note (Signed)
   [facility] Inpatient Consult   10/19/2020  Chase Barton 1951-07-03 [id number]   [facility] [ACO] Patient:  Medicare; [place] noted   Patient screened for less than 30 days readmission hospitalization. Electronic medical record reviewed for potential [facility] service needs.  Review of patient's medical record reveals patient is for home with home health. Spoke with the patient at the bedside and patient states he has " the follow up" he needs he states he goes to the [facility] in [place] for care.   Primary Care Provider:  Patient endorses that the [facility] is his primary care.  Plan:   Assign for post hospital care management needs. Patient accepted and given a brochure with a 24 hour nurse advise line magnet as well. Explained to patient regarding post hospital telephone follow up and that it does not interfere with home health.  Patient verbalized understanding.  [facility] does not interfere with or replace any services needed or arranged by the inpatient [facility] team.  For questions contact:   Natividad Brood, RN BSN [facility] Liaison  [phone] business mobile phone Toll free office [phone]  Fax number: [phone] [email or web address]

## 2020-10-20 ENCOUNTER — Other Ambulatory Visit: Payer: Self-pay

## 2020-10-20 ENCOUNTER — Ambulatory Visit (INDEPENDENT_AMBULATORY_CARE_PROVIDER_SITE_OTHER): Payer: No Typology Code available for payment source | Admitting: Family

## 2020-10-20 ENCOUNTER — Encounter: Payer: Self-pay | Admitting: Family

## 2020-10-20 VITALS — BP 150/60 | HR 60 | Ht 74.0 in | Wt 223.0 lb

## 2020-10-20 DIAGNOSIS — I493 Ventricular premature depolarization: Secondary | ICD-10-CM | POA: Diagnosis not present

## 2020-10-20 DIAGNOSIS — I25118 Atherosclerotic heart disease of native coronary artery with other forms of angina pectoris: Secondary | ICD-10-CM

## 2020-10-20 DIAGNOSIS — Z79899 Other long term (current) drug therapy: Secondary | ICD-10-CM | POA: Diagnosis not present

## 2020-10-20 DIAGNOSIS — R600 Localized edema: Secondary | ICD-10-CM | POA: Diagnosis not present

## 2020-10-20 DIAGNOSIS — I10 Essential (primary) hypertension: Secondary | ICD-10-CM

## 2020-10-20 LAB — AMIODARONE LEVEL
Amiodarone Lvl: 197 ng/mL — ABNORMAL LOW (ref 1000–2500)
N-Desethyl-Amiodarone: 252 ng/mL

## 2020-10-20 LAB — SURGICAL PATHOLOGY

## 2020-10-20 MED ORDER — ISOSORBIDE MONONITRATE ER 30 MG PO TB24: 30.0000 mg | ORAL_TABLET | Freq: Every day | ORAL | 2 refills | Status: AC

## 2020-10-20 MED ORDER — FUROSEMIDE 20 MG PO TABS: 20.0000 mg | ORAL_TABLET | Freq: Every day | ORAL | 2 refills | Status: AC | PRN

## 2020-10-20 MED ORDER — POTASSIUM CHLORIDE ER 10 MEQ PO TBCR: 10.0000 meq | EXTENDED_RELEASE_TABLET | ORAL | 2 refills | Status: AC | PRN

## 2020-10-20 MED ORDER — HYDRALAZINE HCL 25 MG PO TABS: 25.0000 mg | ORAL_TABLET | Freq: Three times a day (TID) | ORAL | 2 refills | Status: AC

## 2020-10-20 NOTE — Patient Instructions (Signed)
Medication Instructions:  Your physician has recommended you make the following change in your medication:   START Lasix 20 mg daily as needed for swelling  START Potassium 10 med daily as needed (To be taken on the days you take Lasix)  Both prescriptions have been sent to your pharmacy  *If you need a refill on your cardiac medications before your next appointment, please call your pharmacy*   Lab Work: None ordered If you have labs (blood work) drawn today and your tests are completely normal, you will receive your results only by: Marland Kitchen MyChart Message (if you have MyChart) OR . A paper copy in the mail If you have any lab test that is abnormal or we need to change your treatment, we will call you to review the results.   Testing/Procedures: None ordered   Follow-Up: At [facility], you and your health needs are our priority.  As part of our continuing mission to provide you with exceptional heart care, we have created designated Provider Care Teams.  These Care Teams include your primary Cardiologist (physician) and Advanced Practice Providers (APPs -  Physician Assistants and Nurse Practitioners) who all work together to provide you with the care you need, when you need it.  We recommend signing up for the patient portal called "MyChart".  Sign up information is provided on this After Visit Summary.  MyChart is used to connect with patients for Virtual Visits (Telemedicine).  Patients are able to view lab/test results, encounter notes, upcoming appointments, etc.  Non-urgent messages can be sent to your provider as well.   To learn more about what you can do with MyChart, go to [email or web address].    Your next appointment:   With your new Cardiologist in [place]   The format for your next appointment:   In Person  Provider:   With your new Cardiologist in [place]   Other Instructions You have signed a release of information, which will allow [place] to send your medical  records to your new Cardiologist in [place].

## 2020-10-20 NOTE — Progress Notes (Signed)
Office Visit    Patient Name: Chase Barton Date of Encounter: 10/20/2020  Primary Care Provider:  Loma Sender, MD Primary Cardiologist:  [name], MD Electrophysiologist:  None   Chief Complaint    Chase Barton is a 69 y.o. male with a hx of CAD with known CTO of RCA and prior stenting to LAD 04/2012 and DES to LCx 09/2020, moderate aortic stenosis, paroxysmal SVT s/p ablation, HTN, HLD, CKD 3, tobacco use, chronic diastolic heart failure  presents today for lower extremity edema  Past Medical History    Past Medical History:  Diagnosis Date  . Aortic stenosis 04/2012   mild-moderate  . Chronic kidney disease    Stage III  . Coronary artery disease 04/2012   Cardiac cath 04/2012: LAD, 90% proximal, RCA: 100% mid with left to right collaterals, Normal EF, Moderate aortic stenosis (peak -peak gradient of 23 mm Hg), LAD PCI with a DES placment.   . Diverticulitis   . Heart murmur   . Hyperlipidemia   . Hypertension 06/20/2012  . PSVT (paroxysmal supraventricular tachycardia) ([place]) 2018   Status post ablation in March 2018 in [place].  . Substance abuse ([place])    remote  . Tobacco abuse    Past Surgical History:  Procedure Laterality Date  . CARDIAC CATHETERIZATION     x1 stent  . CARDIAC CATHETERIZATION    . CARDIAC CATHETERIZATION    . CARDIAC CATHETERIZATION N/A 01/05/2016   Procedure: Right/Left Heart Cath and Coronary Angiography;  Surgeon: Wellington Hampshire, MD;  Location: [facility];  Service: Cardiovascular;  Laterality: N/A;  . CARDIAC ELECTROPHYSIOLOGY STUDY AND ABLATION    . CORONARY STENT INTERVENTION N/A 09/29/2020   Procedure: CORONARY STENT INTERVENTION;  Surgeon: Wellington Hampshire, MD;  Location: [facility];  Service: Cardiovascular;  Laterality: N/A;  . INTRAVASCULAR ULTRASOUND/IVUS N/A 09/29/2020   Procedure: Intravascular Ultrasound/IVUS;  Surgeon: Wellington Hampshire, MD;  Location: [facility];  Service:  Cardiovascular;  Laterality: N/A;  . RIGHT/LEFT HEART CATH AND CORONARY ANGIOGRAPHY N/A 09/29/2020   Procedure: RIGHT/LEFT HEART CATH AND CORONARY ANGIOGRAPHY;  Surgeon: Wellington Hampshire, MD;  Location: [facility];  Service: Cardiovascular;  Laterality: N/A;    Allergies  Allergies  Allergen Reactions  . Atorvastatin Nausea And Vomiting and Other (See Comments)    History of Present Illness    Chase Barton is a 69 y.o. male with a hx of CAD with known CTO of RCA and prior stenting to LAD 04/2012 and DES to LCx 09/2020, probable lung cancer by CT 09/2020 moderate aortic stenosis, paroxysmal SVT s/p ablation, HTN, HLD, CKD 3, tobacco use, chronic diastolic heart failure last seen in clinic 10/13/2020.  Previous cardiac cath January 2017 with two-vessel disease and widely patent proximal LAD stent and CTO of RCA with left-to-right collaterals.  Moderate aortic stenosis with mean gradient 27 mmHg and valve area 0.95 cm.  Echo 09/2019 with LVEF 60 to 65%, moderate diastolic dysfunction, moderate aortic stenosis.  He was admitted to the hospital 10/4/202  Prior to that he had been seen in the ED in [place] and treated with 1 dose of Lasix for CHF and was discharged.  He presented to [facility] for further evaluation as he did not notice improvement in his symptoms.  HS-troponin 25 ? 21, BNP 605, creatinine 1.65, CXR R perihilar infiltrate.  Echo 09/28/2020 LVEF 55-60%, no RWMA, LV moderately dilated,gr2DD, RV normal  size and function, RA severely dilated, trivial MR, mild aortic stenosis  (VTI 1.0 cm, mean gradient 14.5 mmHg ). underwent R/LHC due to unstable angina. [facility] 09/29/2020 with [name] with widely patent LAD stent with no significant restenosis, CTO of RCA with left-to-right collaterals, new 80% stenosis in proximal and mid LCx.  LV angiography not performed due to underlying CKD.  RHC with mild to moderate elevation of filling pressures, moderate pulmonary hypertension,  normal cardiac output.  Mild gradient across aortic valve though difficult due to frequent PVCs.  DES was placed to proximal-mid circumflex.  Was recommended for DAPT for at least 6 months, carvedilol increased to 12.5 mg twice daily. Renal duplex 10/01/2020 with bilateral less than 60% stenosis.  Per Dr. Ellyn Hack renal artery stenosis not cause for high blood pressure.  He was seen by EP during admission due to frequent ventricular bigeminy-he was started on amiodarone.  Called office 10/04/20 noting anxiety since quiting smoking. He was given 1 month supply of Zoloft 50mg  daily with 2 refills by [name] with further refills to come from PCP office. Called the office 10/08/20 noting weight 217 lbs and was below dry weight. His Lasix was changed to 40mg  daily.    Lab work ordered as part of post hospital follow-up performed 10/06/2020 with K2.6, creatinine 1.5, GFR 46.  He was recommended to increase his potassium intake.  Seen in clinic 10/13/20 noting nighttime episodes of hypoxia there is no significant shortness of breath during the day.  Repeat lab work that day showed K2.1, creatinine 2.03, GFR 32.  His Lasix was stopped.  He was prescribed potassium 40 mEq 3 times per day with repeat labs to be collected the following day.  He was seen at the [facility] and labs were collected there.  He called the after-hours line noting potassium returned at 2.6 with elevated liver enzymes.  He was recommended to proceed to the ED for evaluation.  He was readmitted 10/15/2020 with hyperkalemia and acute kidney injury.  CT chest 10/16/2020 showing right perihilar mass 7.6x4.9 cm consistent with primary bronchogenic carcinoma. Noted mass effect on proximal bronchi to RUL, RML, and RLL but these bronchi remain patent. Alos noted bulky lymphadenopathy within mediastinum, right axilla, left axilla consistent with metastatic disease.  Separate pulmonary nodules within right lower lobe and right middle lobe.  Diffusely  heterogeneous appearance of liver highly suspicious for diffuse liver metastases.  Subtle lucencies within the left anterior lateral fifth and sixth ribs suspicious for early osseous metastases.  He underwent lymph node biopsy and requested to be discharged.  He was discharged with supplemental oxygen.  His daughter sent a MyChart message was morning noting lower extremity edema.  He was concerned as he was discharged without Lasix and received IV fluids while in the hospital for AKI.  He was scheduled for office visit.  His weight is up 10 pounds compared to clinic visit 1 week ago that was the same as hospital discharge yesterday.  He reports no orthnopnea, PND. No shortness of breath at rest. He has trace pedal edema on exam.  In discussion with him and his daughter, he is planning to move to [place] this Friday as his daughters who are both nurses live there. They have a cardiologist, primary care provider, and oncologist selected in the area for prompt follow up.  EKGs/Labs/Other Studies Reviewed:   The following studies were reviewed today: CT 10/16/20 IMPRESSION: 1. RIGHT perihilar mass measures 7.6 x 4.9 cm, consistent with primary bronchogenic  carcinoma. This is causing mass effect on the proximal bronchi to the RIGHT upper lobe, RIGHT middle lobe and RIGHT lower lobe, but these bronchi remain patent. 2. Bulky lymphadenopathy within the mediastinum, RIGHT axilla and LEFT axilla, consistent with metastatic disease. 3. Separate pulmonary nodules within the RIGHT lower lobe and RIGHT middle lobe, largest measuring 1.6 cm, compatible with metastatic disease. 4. Diffusely heterogeneous appearance of the liver, highly suspicious for diffuse liver metastases given the corresponding appearance of the liver on earlier ultrasound. 5. Subtle lucencies within the LEFT anterior-lateral fifth and sixth ribs, suspicious for early osseous metastasis. 6. Patchy consolidations within the RIGHT lower  lung, most likely atelectasis. Pneumonia is considered less likely. 7. Coronary artery calcifications.   Aortic Atherosclerosis (ICD10-I70.0). Liver US 10/16/20 IMPRESSION: 1. Liver parenchyma is nearly completely replaced throughout by nodular echogenic mass-like foci. These are suspicious for metastatic disease, alternatively markedly prominent regenerating nodules. Recommend liver MRI for further characterization. 2. 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 Portal vein is patent with appropriate hepatopetal direction of blood flow. 3. Gallbladder appears normal.  Renal ultrasound 10/01/2020 Right: 1-59% stenosis of the right renal artery. Cyst(s) noted. RRV         flow present.  Left:  1-59% stenosis of the left renal artery. LRV flow present.  Mesenteric:  Normal Celiac artery findings.   [facility] 09/29/2020  Dist Cx lesion is 20% stenosed.  Prox RCA lesion is 70% stenosed.  Prox Cx lesion is 80% stenosed.  Post intervention, there is a 10% residual stenosis.  A drug-eluting stent was successfully placed using a STENT RESOLUTE ONYX 4.5X12.  Lat 1st Mrg lesion is 75% stenosed.  Mid Cx lesion is 80% stenosed.  Post intervention, there is a 0% residual stenosis.  A drug-eluting stent was successfully placed using a STENT RESOLUTE ONYX 4.0X12.  Previously placed Mid LAD stent (unknown type) is widely patent.  Mid RCA lesion is 100% stenosed.   1.  Widely patent LAD stent with no significant restenosis, chronically occluded right coronary artery with left-to-right collaterals, new 80% stenosis in proximal and mid left circumflex. 2.  Left ventricular angiography was not performed due to underlying chronic kidney disease. 3.  Right heart catheterization showed mild to moderate elevation in filling pressures, moderate pulmonary hypertension and normal cardiac output. 4.  Mild gradient across aortic valve.  Evaluating the gradient was  difficult due to frequent PVCs. 5.  Successful IVUS guided drug-eluting stent placement to mid and proximal left circumflex.   Recommendations: Dual antiplatelet therapy for at least 6 months. Aggressive treatment of risk factors and blood pressure control. I increase carvedilol to 12.5 mg twice daily. The patient continues to be volume overloaded and probably requires 1 more day of IV diuresis.  Monitor renal function closely. Diagnostic Dominance: Co-dominant  Intervention      Echo: 09/28/20  IMPRESSIONS   1. Left ventricular ejection fraction, by estimation, is 55 to 60%. The  left ventricle has normal function. The left ventricle has no regional  wall motion abnormalities. The left ventricular internal cavity size was  moderately dilated. Left ventricular  diastolic parameters are consistent with Grade II diastolic dysfunction  (pseudonormalization).   2. Right ventricular systolic function is normal. The right ventricular  size is normal. There is normal pulmonary artery systolic pressure.   3. Right atrial size was severely dilated.   4. The mitral valve is normal in structure. Trivial mitral valve  regurgitation. No evidence of mitral  stenosis.   5. The aortic valve is normal in structure. There is mild calcification  of the aortic valve. There is mild thickening of the aortic valve. Aortic  valve regurgitation is moderate. Mild aortic valve stenosis. Aortic valve  area, by VTI measures 1.00 cm.  Aortic valve mean gradient measures 14.5 mmHg. Aortic valve Vmax measures  2.47 m/s.   6. The inferior vena cava is dilated in size with <50% respiratory  variability, suggesting right atrial pressure of 15 mmHg.   EKG:  No EKG today.   Recent Labs: 10/17/2020: TSH 1.678 10/19/2020: ALT 264; B Natriuretic Peptide 679.4; BUN 39; Creatinine, Ser 1.76; Hemoglobin 13.6; Magnesium 2.4; Platelets 99; Potassium 3.8; Sodium 143  Recent Lipid Panel    Component Value Date/Time    CHOL 113 09/28/2020 0521   CHOL 148 07/31/2018 0812   TRIG 78 09/28/2020 0521   HDL 53 09/28/2020 0521   HDL 45 07/31/2018 0812   CHOLHDL 2.1 09/28/2020 0521   VLDL 16 09/28/2020 0521   LDLCALC 44 09/28/2020 0521   LDLCALC 78 07/31/2018 0812    Home Medications   Current Meds  Medication Sig  . acetaminophen (TYLENOL) 500 MG tablet Take 1,000 mg by mouth daily.  Marland Kitchen albuterol (VENTOLIN HFA) 108 (90 Base) MCG/ACT inhaler Inhale 2 puffs into the lungs every 6 (six) hours as needed for wheezing or shortness of breath.  Marland Kitchen amLODipine (NORVASC) 10 MG tablet Take 1 tablet (10 mg total) by mouth daily.  Marland Kitchen amoxicillin-clavulanate (AUGMENTIN) 875-125 MG tablet Take 1 tablet by mouth 2 (two) times daily for 5 days.  Marland Kitchen ascorbic acid (VITAMIN C) 500 MG tablet Take 500 mg by mouth daily.   Marland Kitchen aspirin EC 81 MG tablet Take 81 mg by mouth daily.  . carvedilol (COREG) 12.5 MG tablet Take 1 tablet (12.5 mg total) by mouth 2 (two) times daily with a meal.  . clopidogrel (PLAVIX) 75 MG tablet Take 1 tablet (75 mg total) by mouth daily with breakfast.  . isosorbide-hydrALAZINE (BIDIL) 20-37.5 MG tablet Take 1 tablet by mouth 3 (three) times daily.  Marland Kitchen latanoprost (XALATAN) 0.005 % ophthalmic solution Place 1 drop into both eyes daily.   . nitroGLYCERIN (NITROSTAT) 0.4 MG SL tablet Place 0.4 mg under the tongue every 5 (five) minutes as needed for chest pain.  . ranolazine (RANEXA) 1000 MG SR tablet Take 1 tablet (1,000 mg total) by mouth 2 (two) times daily.  . rosuvastatin (CRESTOR) 20 MG tablet Take 1 tablet (20 mg total) by mouth daily.  . sertraline (ZOLOFT) 50 MG tablet Take 1 tablet (50 mg total) by mouth daily.  . tamsulosin (FLOMAX) 0.4 MG CAPS capsule Take 0.4 mg by mouth daily.  . traMADol (ULTRAM) 50 MG tablet Take 50 mg by mouth every 6 (six) hours as needed for moderate pain.      Review of Systems  All other systems reviewed and are otherwise negative except as noted above.  Physical Exam     VS:  BP (!) 150/60 (BP Location: Right Arm, Patient Position: Sitting, Cuff Size: Normal)   Pulse 60   Ht 6\' 2"  (1.88 m)   Wt 223 lb (101.2 kg)   SpO2 97%   BMI 28.63 kg/m  , BMI Body mass index is 28.63 kg/m.  Wt Readings from Last 3 Encounters:  10/20/20 223 lb (101.2 kg)  10/19/20 220 lb 0.3 oz (99.8 kg)  10/13/20 213 lb 4 oz (96.7 kg)    GEN: Well nourished, well  developed, in no acute distress. HEENT: normal. Neck: Supple, no JVD, carotid bruits, or masses. Cardiac: RRR, no murmurs, rubs, or gallops. No clubbing, cyanosis. Trace pedal edema.  Radials/PT 2+ and equal bilaterally.  Respiratory:  Respirations regular and unlabored. Bilateral rhonchi. GI: Soft, nontender, nondistended. MS: No deformity or atrophy. Skin: Warm and dry, no rash. Neuro:  Strength and sensation are intact. Psych: Normal affect.  Assessment & Plan    1. Lung cancer - Readmitted 10/15/20 with weight loss, AKI, elevated LFTs,  hypokalemia. CT scan and RUQ ultrasound suggestive of metastatic lung cancer. 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 He is maintaining O2 saturations on 2L O2. Plans to establish with oncology in [place] as he is moving there Friday to be closer to family.   2. CAD-09/29/20 s/p DES X 2 (proximal LCx &  mid LCx). EKG 10/13/20 with lateral TWI improved compared to previous. GDMT includes DAPT for at least 6 months from 09/29/2020 of aspirin and Plavix.  Denies bleeding complications.  Additional GDMT includes carvedilol 12.5 mg twice daily, Ranexa 1000 mg twice daily, Imdur.  3. PVC and pattern of bigeminy previously on amiodarone therapy - Amiodarone DC'd during recent hospitalization due to elevated liver enzymes. Reports infrequent palpitations which are overall not bothersome. Continue Coreg 12.5mg  BID. Consider establishing with EP in [place].  4. Diastolic heart failure / AKI on CKD 3 /  Hypokalemia- Recent admission with hypokalemia (K as low as 2.1), AKI on CKD 3 requiring IVF. Overall euvolemic on exam with only trace pedal edema. Discussed importance of preventative measures including elevating lower extremities, compression stockings (he is wearing today), and low salt diet. Prescribed Rx for Lasix 20mg  with K 10 mEq as needed for weight gain of 2 pounds overnight or 5 pounds in one week. He will establish with cardiology in [place] and follow BMP closely.   5. Mild aortic stenosis-By echo 09/28/2020.  Consider repeat echo in 1 year for monitoring.  Continue optimal BP and volume control.  6. HTN-  Continue present antihypertensive regimen including Amlodipine 10mg  daily, coreg 12.5mg  BID, Bidil. Received notice after his appt that pharmacy did not have Bidil available, as such transition to Imdur 30mg  daily and Hydralazine 25mg  TID.  7. HLD, LDL goal less than 70-09/2020 LDL 44.  Continue Crestor 20 mg daily.  8. CKD 3 -no ACE/ARB/Arni per nephrology.  Careful titration of diuretics and antihypertensive.  Avoid nephrotoxic agents.  Continue to follow with nephrology.  Disposition: Moving to [place] to be closer to his two daughters who are nurses during his workup and treatment for lung cancer. Filled out paperwork today for records to be released to various health care providers in [place]. Encouraged to reach out to [place] if they have any further needs.   Signed, Loel Dubonnet, NP 10/20/2020, 10:52 AM [place]

## 2020-10-21 ENCOUNTER — Encounter: Payer: Self-pay | Admitting: Family

## 2020-10-24 DIAGNOSIS — C227 Other specified carcinomas of liver: Secondary | ICD-10-CM | POA: Diagnosis not present

## 2020-10-25 ENCOUNTER — Other Ambulatory Visit: Payer: Self-pay | Admitting: Physician Assistant

## 2020-10-25 DIAGNOSIS — R918 Other nonspecific abnormal finding of lung field: Secondary | ICD-10-CM

## 2020-10-26 ENCOUNTER — Other Ambulatory Visit: Payer: Self-pay | Admitting: *Deleted

## 2020-10-26 ENCOUNTER — Inpatient Hospital Stay: Payer: Medicare Other | Admitting: Internal Medicine

## 2020-10-26 ENCOUNTER — Inpatient Hospital Stay: Payer: Medicare Other

## 2020-10-26 DIAGNOSIS — Z743 Need for continuous supervision: Secondary | ICD-10-CM | POA: Diagnosis not present

## 2020-10-26 DIAGNOSIS — N189 Chronic kidney disease, unspecified: Secondary | ICD-10-CM | POA: Diagnosis present

## 2020-10-26 DIAGNOSIS — C787 Secondary malignant neoplasm of liver and intrahepatic bile duct: Secondary | ICD-10-CM | POA: Diagnosis not present

## 2020-10-26 DIAGNOSIS — Z515 Encounter for palliative care: Secondary | ICD-10-CM | POA: Diagnosis not present

## 2020-10-26 DIAGNOSIS — I5030 Unspecified diastolic (congestive) heart failure: Secondary | ICD-10-CM | POA: Diagnosis not present

## 2020-10-26 DIAGNOSIS — R0602 Shortness of breath: Secondary | ICD-10-CM | POA: Diagnosis not present

## 2020-10-26 DIAGNOSIS — Z7982 Long term (current) use of aspirin: Secondary | ICD-10-CM | POA: Diagnosis not present

## 2020-10-26 DIAGNOSIS — I2510 Atherosclerotic heart disease of native coronary artery without angina pectoris: Secondary | ICD-10-CM | POA: Diagnosis present

## 2020-10-26 DIAGNOSIS — C7951 Secondary malignant neoplasm of bone: Secondary | ICD-10-CM | POA: Diagnosis not present

## 2020-10-26 DIAGNOSIS — J984 Other disorders of lung: Secondary | ICD-10-CM | POA: Diagnosis not present

## 2020-10-26 DIAGNOSIS — J90 Pleural effusion, not elsewhere classified: Secondary | ICD-10-CM | POA: Diagnosis not present

## 2020-10-26 DIAGNOSIS — C3491 Malignant neoplasm of unspecified part of right bronchus or lung: Secondary | ICD-10-CM | POA: Diagnosis not present

## 2020-10-26 DIAGNOSIS — K5900 Constipation, unspecified: Secondary | ICD-10-CM | POA: Diagnosis present

## 2020-10-26 DIAGNOSIS — R0789 Other chest pain: Secondary | ICD-10-CM | POA: Diagnosis not present

## 2020-10-26 DIAGNOSIS — R918 Other nonspecific abnormal finding of lung field: Secondary | ICD-10-CM | POA: Diagnosis not present

## 2020-10-26 DIAGNOSIS — Z87891 Personal history of nicotine dependence: Secondary | ICD-10-CM | POA: Diagnosis not present

## 2020-10-26 DIAGNOSIS — J9600 Acute respiratory failure, unspecified whether with hypoxia or hypercapnia: Secondary | ICD-10-CM | POA: Diagnosis not present

## 2020-10-26 DIAGNOSIS — E785 Hyperlipidemia, unspecified: Secondary | ICD-10-CM | POA: Diagnosis present

## 2020-10-26 DIAGNOSIS — Z955 Presence of coronary angioplasty implant and graft: Secondary | ICD-10-CM | POA: Diagnosis not present

## 2020-10-26 DIAGNOSIS — R079 Chest pain, unspecified: Secondary | ICD-10-CM | POA: Diagnosis not present

## 2020-10-26 DIAGNOSIS — F329 Major depressive disorder, single episode, unspecified: Secondary | ICD-10-CM | POA: Diagnosis present

## 2020-10-26 DIAGNOSIS — C3490 Malignant neoplasm of unspecified part of unspecified bronchus or lung: Secondary | ICD-10-CM | POA: Diagnosis not present

## 2020-10-26 DIAGNOSIS — Z7902 Long term (current) use of antithrombotics/antiplatelets: Secondary | ICD-10-CM | POA: Diagnosis not present

## 2020-10-26 DIAGNOSIS — R591 Generalized enlarged lymph nodes: Secondary | ICD-10-CM | POA: Diagnosis not present

## 2020-10-26 DIAGNOSIS — J449 Chronic obstructive pulmonary disease, unspecified: Secondary | ICD-10-CM | POA: Diagnosis present

## 2020-10-26 DIAGNOSIS — E876 Hypokalemia: Secondary | ICD-10-CM | POA: Diagnosis present

## 2020-10-26 DIAGNOSIS — J9811 Atelectasis: Secondary | ICD-10-CM | POA: Diagnosis not present

## 2020-10-26 DIAGNOSIS — F419 Anxiety disorder, unspecified: Secondary | ICD-10-CM | POA: Diagnosis present

## 2020-10-26 DIAGNOSIS — R042 Hemoptysis: Secondary | ICD-10-CM | POA: Diagnosis not present

## 2020-10-26 DIAGNOSIS — I130 Hypertensive heart and chronic kidney disease with heart failure and stage 1 through stage 4 chronic kidney disease, or unspecified chronic kidney disease: Secondary | ICD-10-CM | POA: Diagnosis not present

## 2020-10-26 NOTE — Patient Outreach (Signed)
[facility]) Care Management  10/26/2020  Chase Barton 1951-07-14 [id number]  [facility] unsuccessful outreach to post hospital referred patient Referral date 10/19/20 Referral source Natividad Brood, [facility] RN CM hospital liaison Referral reason High risk readmission less than 30 days - Medicare NextGen/VA patient  Please assign to [name] for complex care and disease management follow up calls and assess for further needs. Patient new to oxygen assess for set up.   Last admission [facility] 10/15/20-10/19/20 for acute hepatitis, Coronary Artery Disease (CAD) of native heart with unstable angina pectoris   Outreach attempt # 1 No answer. [facility] RN CM left HIPAA [facility] Portability and Accountability Act) compliant voicemail message along with CM's contact info.  Past Medical History:  Diagnosis Date  . Aortic stenosis 04/2012   mild-moderate  . Chronic kidney disease    Stage III  . Coronary artery disease 04/2012   Cardiac cath 04/2012: LAD, 90% proximal, RCA: 100% mid with left to right collaterals, Normal EF, Moderate aortic stenosis (peak -peak gradient of 23 mm Hg), LAD PCI with a DES placment.   . Diverticulitis   . Heart murmur   . Hyperlipidemia   . Hypertension 06/20/2012  . PSVT (paroxysmal supraventricular tachycardia) ([place]) 2018   Status post ablation in March 2018 in [place].  . Substance abuse ([place])    remote  . Tobacco abuse      Plan: [facility] RN CM sent an unsuccessful outreach letter and scheduled this patient for another call attempt within 4 business days   [name], RN, BSN, [place] Coordinator Office number [phone] [facility] number [phone] Fax number [phone]

## 2020-10-27 ENCOUNTER — Encounter: Payer: Self-pay | Admitting: *Deleted

## 2020-10-28 ENCOUNTER — Other Ambulatory Visit: Payer: Self-pay | Admitting: *Deleted

## 2020-10-28 NOTE — Patient Outreach (Signed)
[facility]) Care Management  10/28/2020  Chase Barton 1951-03-15 [id number]   [facility] 2nd unsuccessful outreach to post hospital referred patient Referral date 10/19/20 Referral source Natividad Brood, [facility] RN CM hospital liaison Referral reason High risk readmission less than 30 days - Medicare NextGen/VA patient  Please assign to [place] for complex care and disease management follow up calls and assess for further needs. Patient new to oxygen assess for set up.   Last admission [facility] 10/15/20-10/19/20 for acute hepatitis, Coronary Artery Disease (CAD) of native heart with unstable angina pectoris   Outreach attempt # 2 No answer. [facility] RN CM left HIPAA [facility] Portability and Accountability Act) compliant voicemail message along with CM's contact info  Plan: [facility] RN CMscheduled this patient for another call attempt within 4-7 business days   [name] L. Lavina Hamman, RN, BSN, [place] Coordinator Office number [phone] [facility] number [phone] Fax number [phone]

## 2020-11-02 ENCOUNTER — Other Ambulatory Visit: Payer: Medicare Other

## 2020-11-02 ENCOUNTER — Ambulatory Visit: Payer: Medicare Other | Admitting: Cardiovascular Disease

## 2020-11-03 ENCOUNTER — Other Ambulatory Visit: Payer: Self-pay | Admitting: *Deleted

## 2020-11-03 ENCOUNTER — Encounter: Payer: Self-pay | Admitting: *Deleted

## 2020-11-03 NOTE — Patient Outreach (Addendum)
Chase Barton) Care Management  11/03/2020  Chase Barton 1951-05-12 [id number]  [facility] 2nd unsuccessful outreach to post hospital referred patient Referral date 10/19/20 [facility] Referral reasonHigh risk readmission less than 30 days - Medicare [employer]/[place] patient  Please assign to [place] Coordinator for complex care and disease management follow up calls and assess for further needs. Patient new to oxygen assess for set up.  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 11-01-2020 condolences offered to the family and encouraged to outreach if needs -case closed  Plans Case closure  Routed note to MD(s)   Joelene Millin L. Lavina Hamman, RN, BSN, [place] Coordinator Office number [phone] [facility] number [phone] Fax number [phone]

## 2020-11-08 ENCOUNTER — Ambulatory Visit: Payer: Medicare Other | Admitting: Cardiology

## 2020-11-12 ENCOUNTER — Other Ambulatory Visit (HOSPITAL_COMMUNITY): Payer: Medicare Other

## 2020-11-24 NOTE — Progress Notes (Signed)
I received a message that [name] is living in [place] and will get treatment there. No need to schedule.

## 2020-11-24 DEATH — deceased

## 2020-12-05 IMAGING — DX DG CHEST 2V
2 series · 2 of 2 positions shown · non-contrast
Comparison: 01/01/2012

CLINICAL DATA: Chest pain

EXAM:
CHEST - 2 VIEW

[w chest pa]
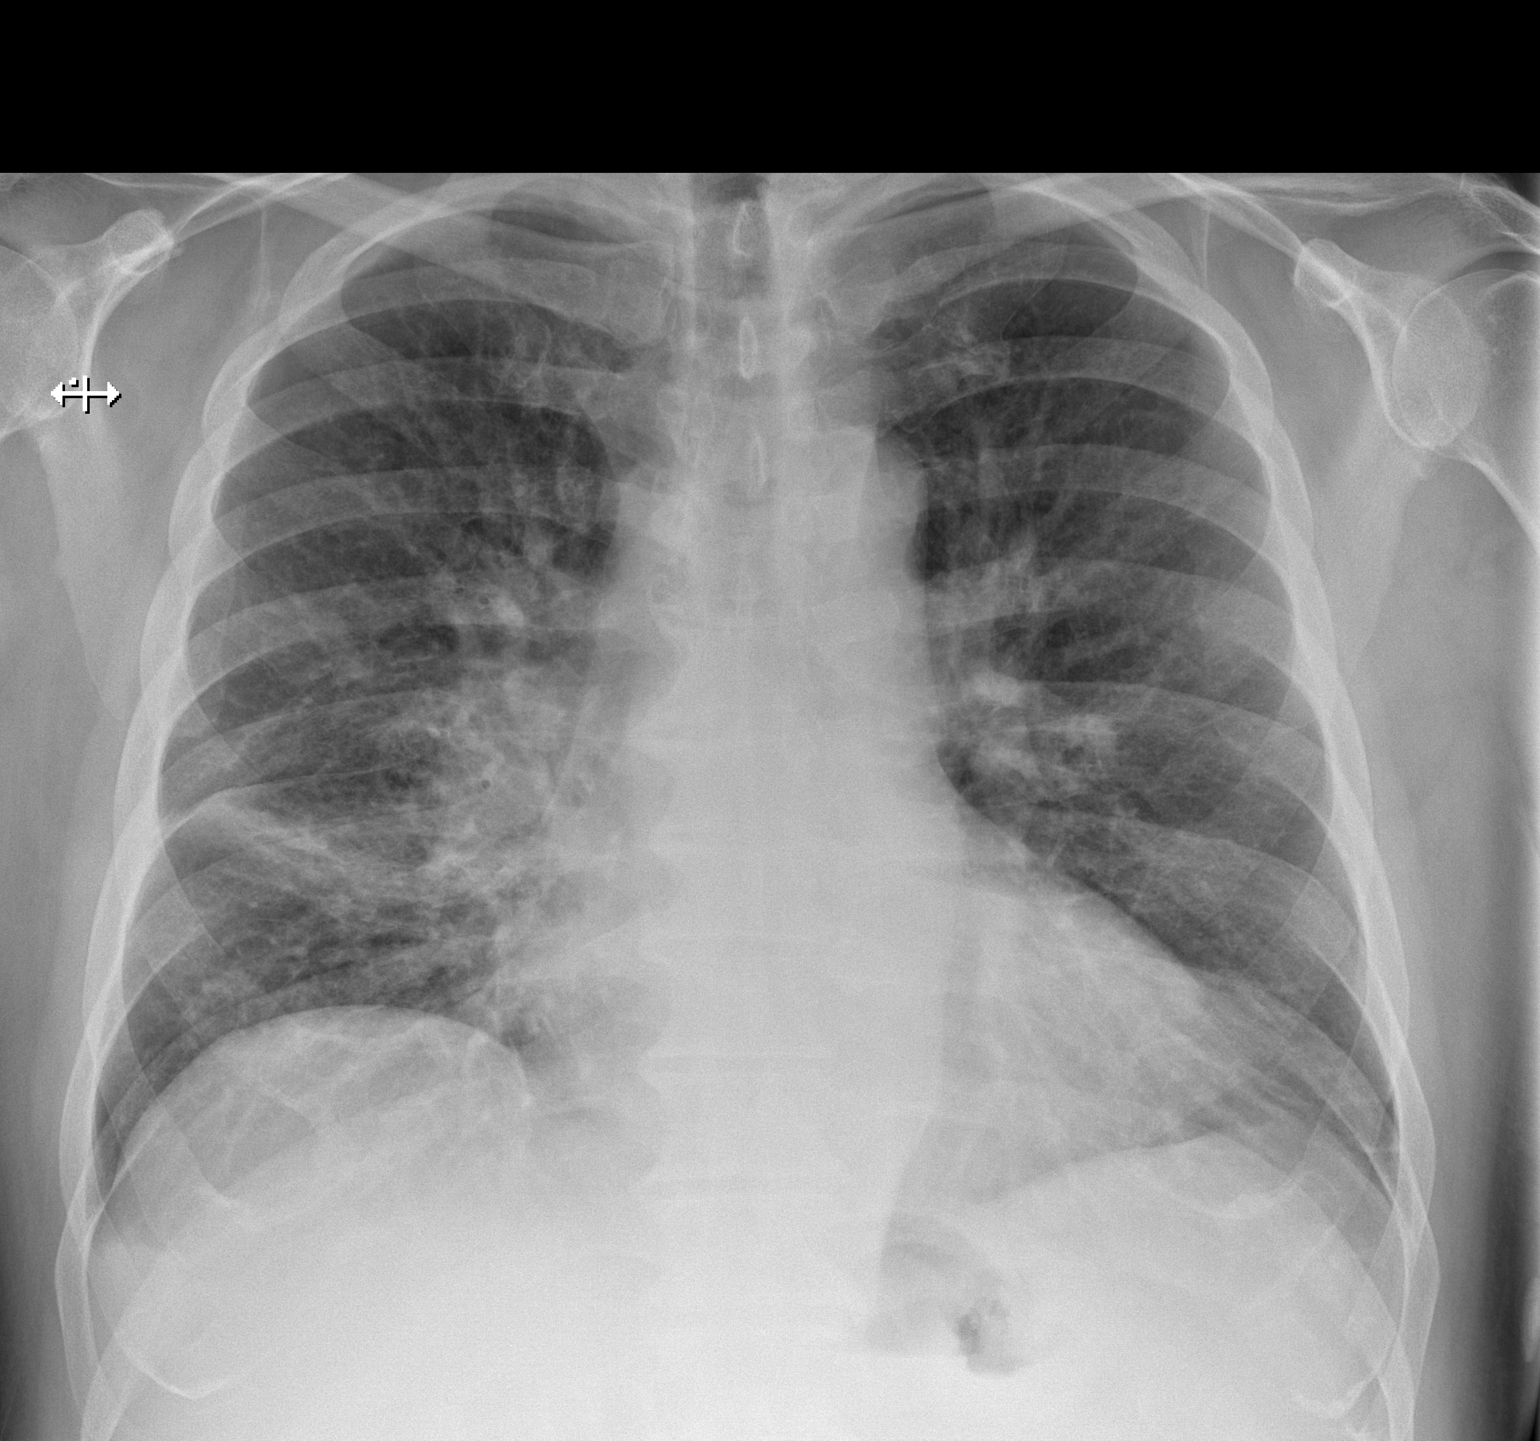

[w chest lat]
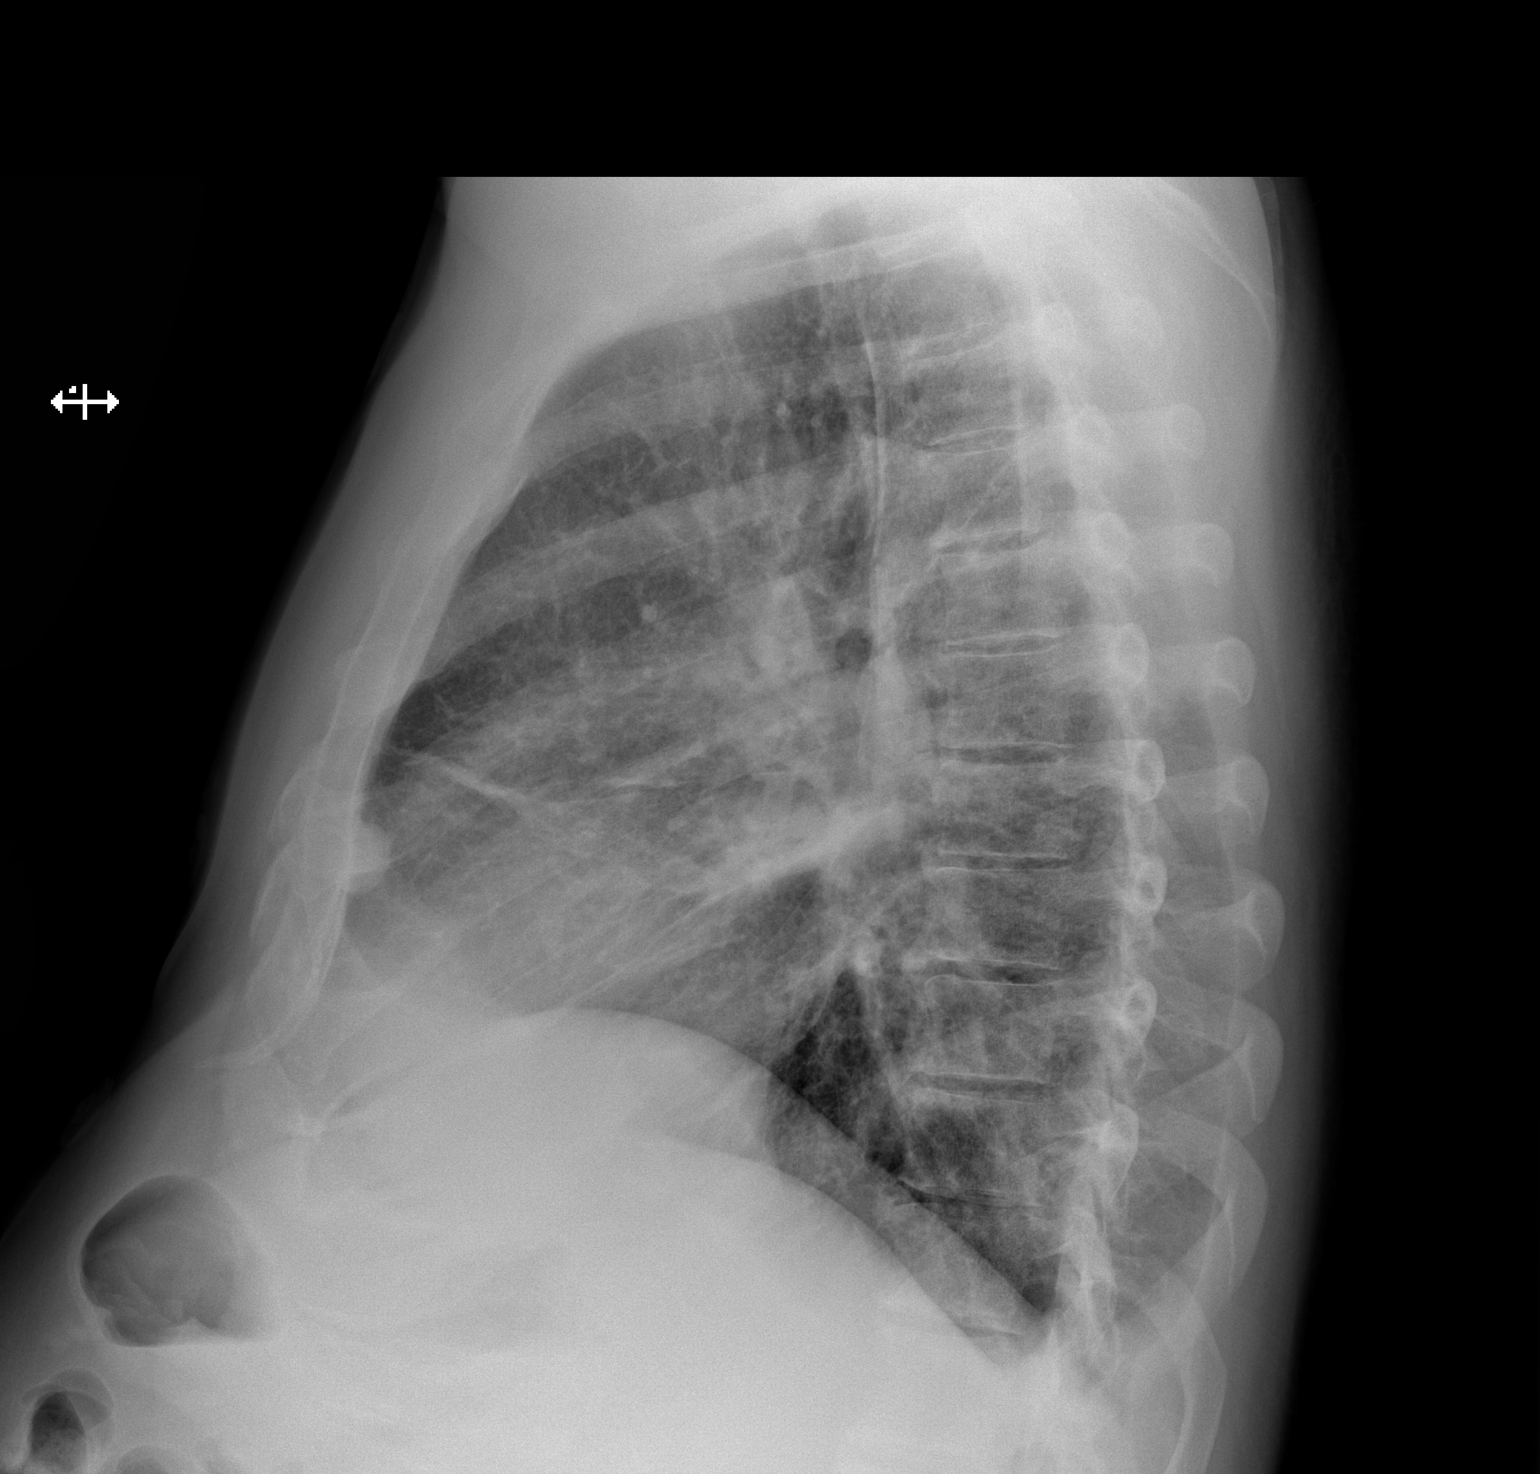

[2 of 2 positions shown; findings below may reference images not displayed]

FINDINGS: Right perihilar infiltrate. No edema, effusion, or pneumothorax.
Borderline heart size, likely accentuated by fat pad. Normal aortic
contours. Rounded bony density posterior to the sternum on the
lateral view is related to the ribs and chronic.
IMPRESSION: Right perihilar infiltrate. Followup PA and lateral chest X-ray is
recommended in 3-4 weeks to ensure resolution.

## 2020-12-07 IMAGING — CR DG CHEST 2V
2 series · 2 of 2 positions shown · non-contrast
Comparison: 09/27/2020

CLINICAL DATA: Cough, pulmonary infiltrate

EXAM:
CHEST - 2 VIEW

[chest pa]
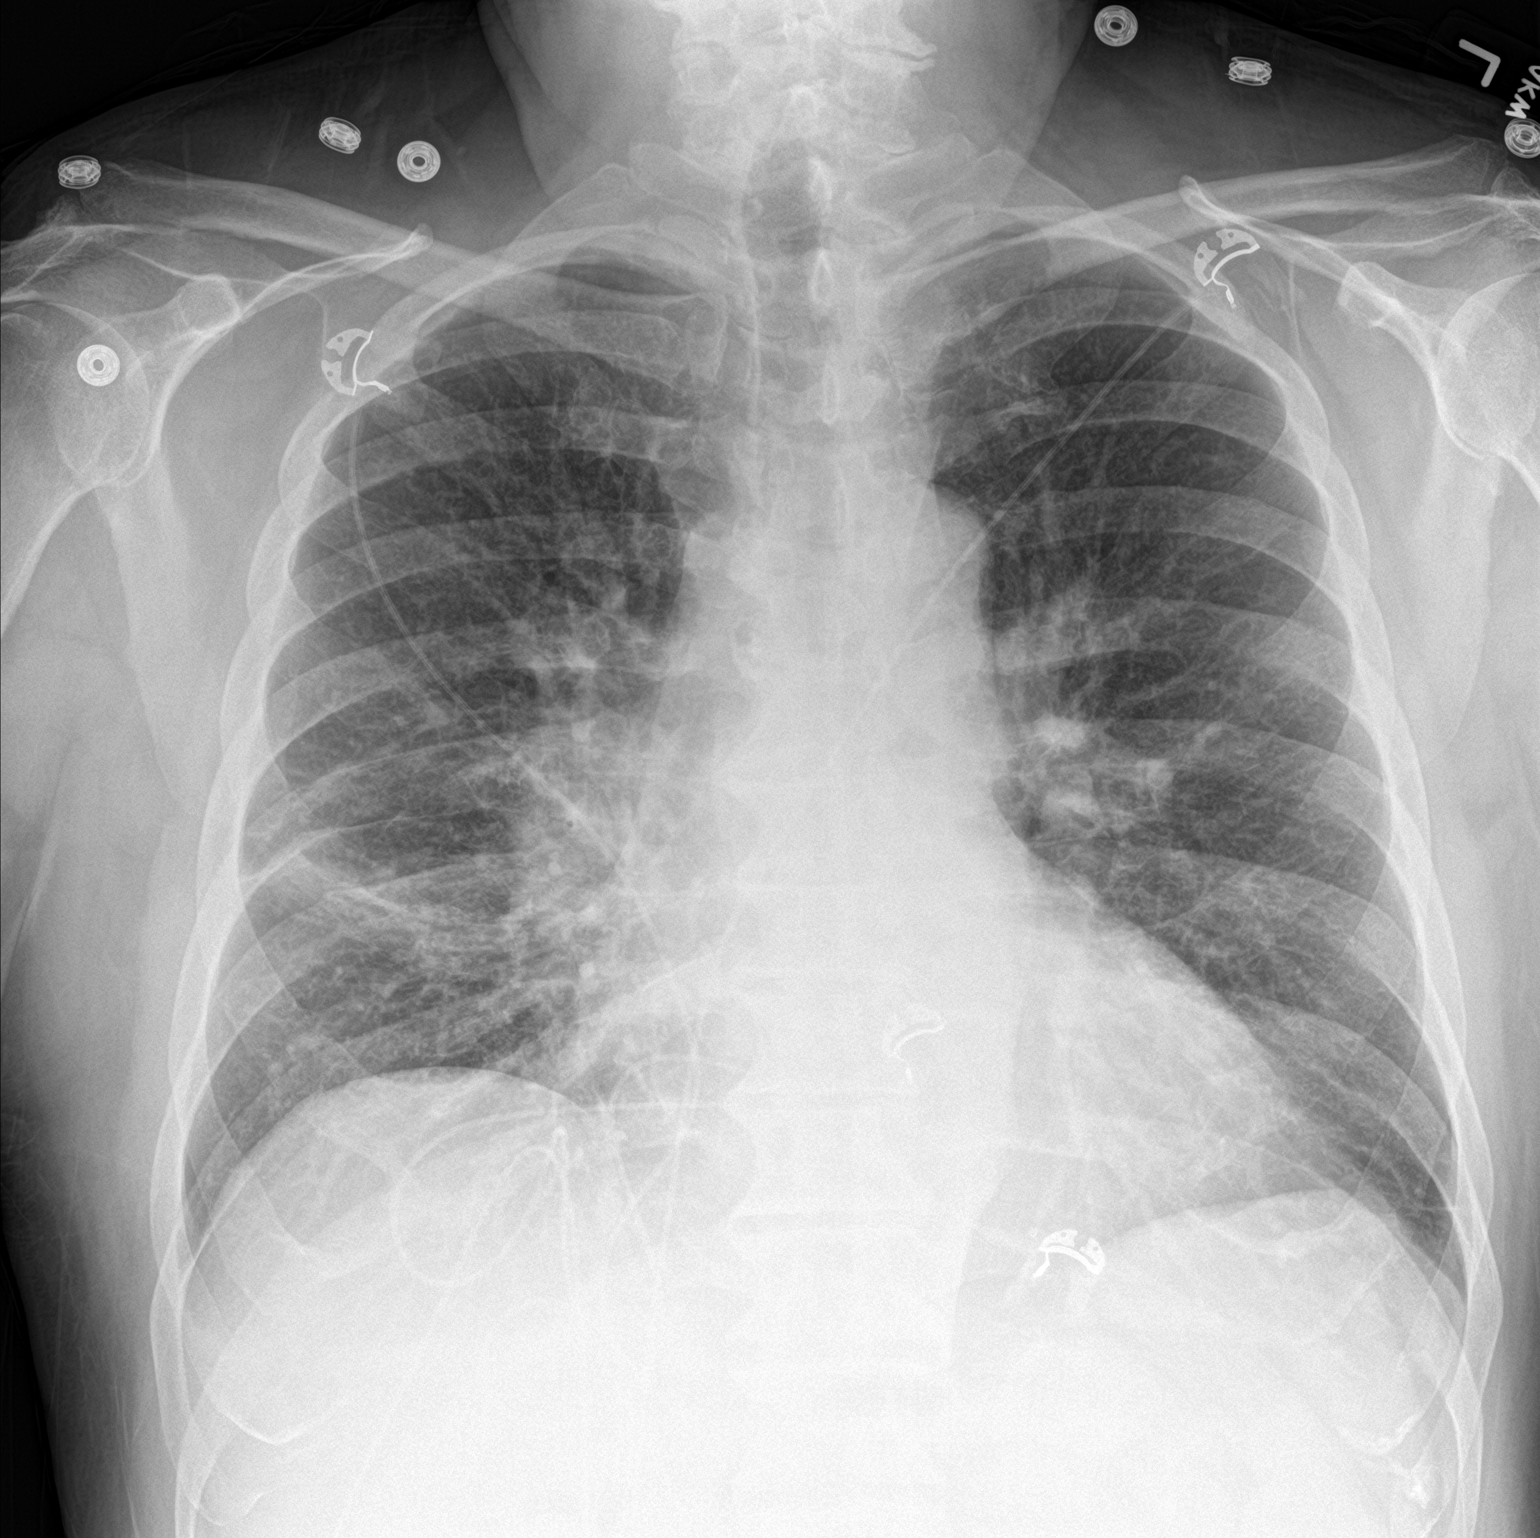

[chest lat]
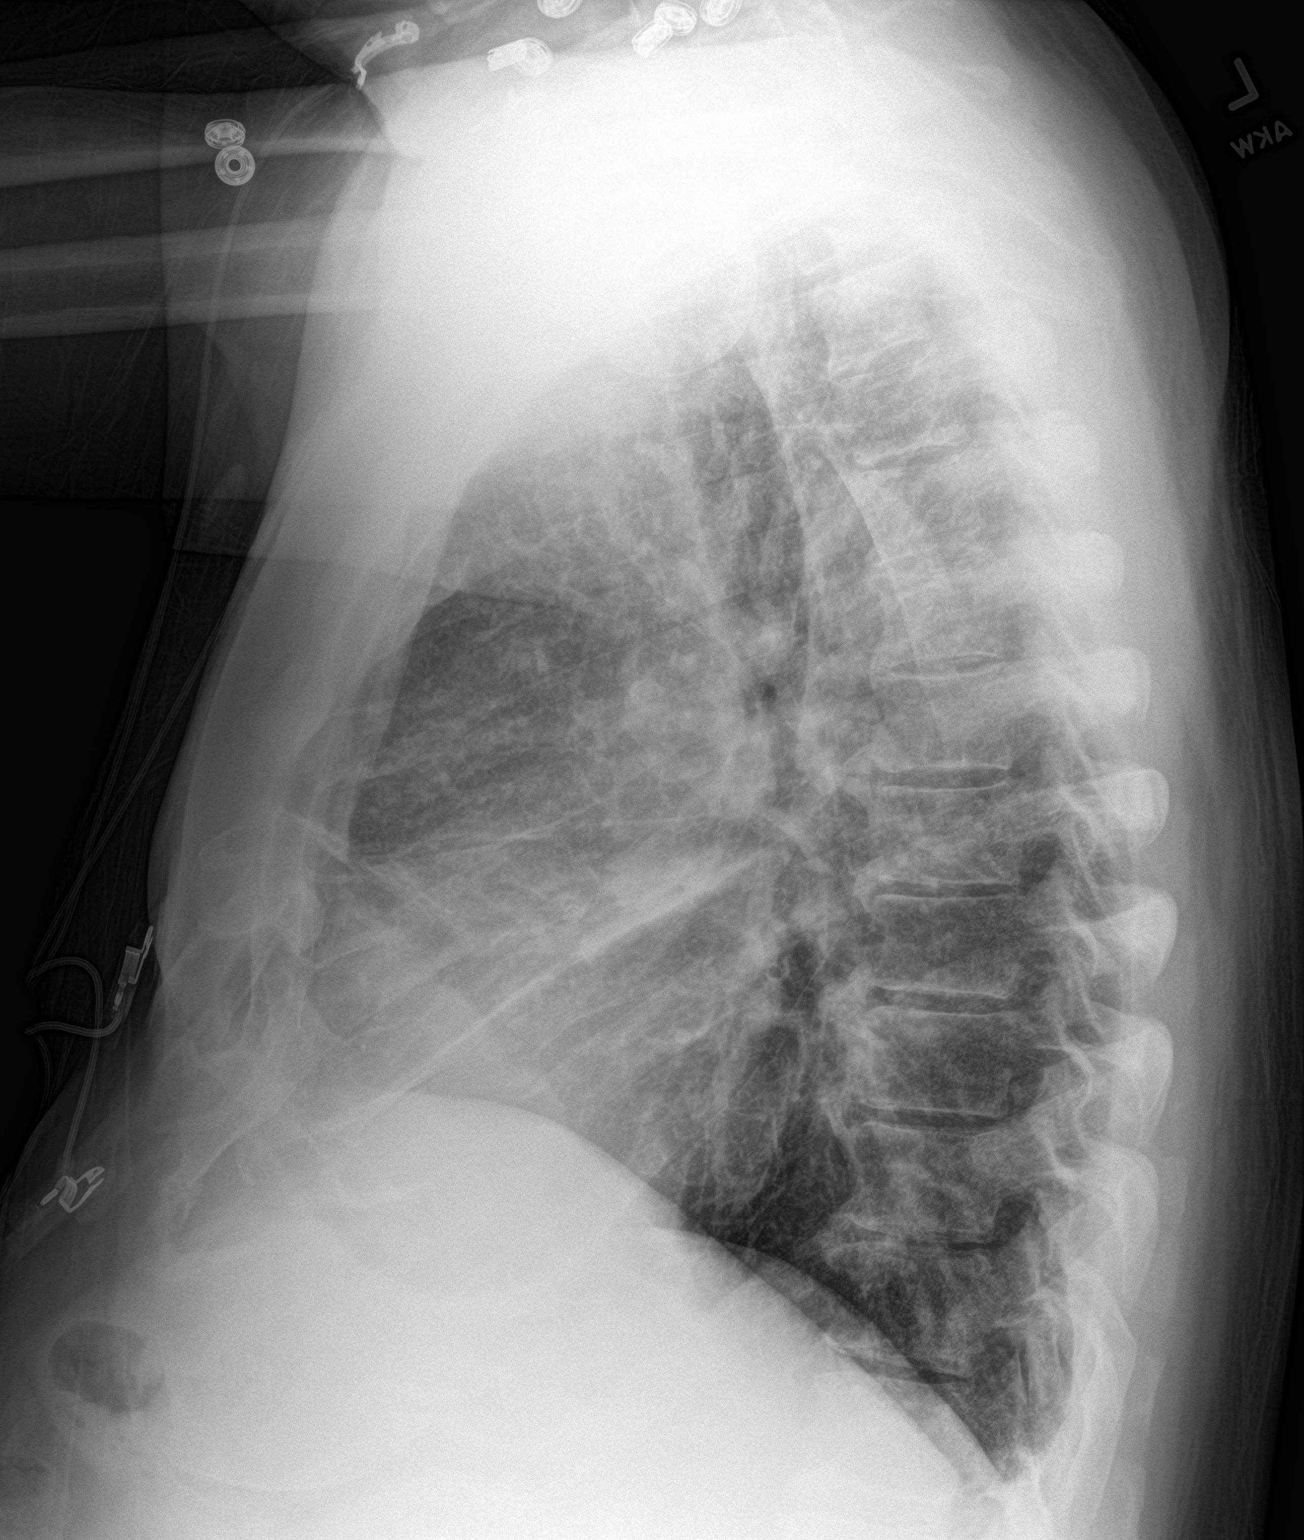

[2 of 2 positions shown; findings below may reference images not displayed]

FINDINGS: Normal heart size and pulmonary vascularity.

Prominence of RIGHT hilum.

Mediastinal contours otherwise normal.

Peribronchial thickening with subsegmental atelectasis at RIGHT
base.

Scattered interstitial infiltrates RIGHT perihilar to RIGHT base.

Remaining lungs clear.

No pleural effusion or pneumothorax.
IMPRESSION: Persistent RIGHT perihilar/basilar infiltrate and basilar
atelectasis.

## 2020-12-13 ENCOUNTER — Ambulatory Visit: Payer: Medicare Other | Admitting: Family
